# Patient Record
Sex: Male | Born: 1935 | Race: White | Hispanic: No | Marital: Married | Smoking: Never smoker
Health system: Southern US, Community
[De-identification: ages and names within clinical notes are randomized; demographics above are authoritative.]

## PROBLEM LIST (undated history)

## (undated) DIAGNOSIS — I10 Essential (primary) hypertension: Secondary | ICD-10-CM

## (undated) DIAGNOSIS — I82409 Acute embolism and thrombosis of unspecified deep veins of unspecified lower extremity: Secondary | ICD-10-CM

## (undated) DIAGNOSIS — K2960 Other gastritis without bleeding: Secondary | ICD-10-CM

## (undated) DIAGNOSIS — I714 Abdominal aortic aneurysm, without rupture, unspecified: Secondary | ICD-10-CM

## (undated) DIAGNOSIS — I493 Ventricular premature depolarization: Secondary | ICD-10-CM

## (undated) DIAGNOSIS — I5042 Chronic combined systolic (congestive) and diastolic (congestive) heart failure: Secondary | ICD-10-CM

## (undated) DIAGNOSIS — K449 Diaphragmatic hernia without obstruction or gangrene: Secondary | ICD-10-CM

## (undated) DIAGNOSIS — E785 Hyperlipidemia, unspecified: Secondary | ICD-10-CM

## (undated) DIAGNOSIS — I2699 Other pulmonary embolism without acute cor pulmonale: Secondary | ICD-10-CM

## (undated) DIAGNOSIS — R109 Unspecified abdominal pain: Secondary | ICD-10-CM

## (undated) DIAGNOSIS — N200 Calculus of kidney: Secondary | ICD-10-CM

## (undated) DIAGNOSIS — I2510 Atherosclerotic heart disease of native coronary artery without angina pectoris: Secondary | ICD-10-CM

## (undated) DIAGNOSIS — F419 Anxiety disorder, unspecified: Secondary | ICD-10-CM

## (undated) DIAGNOSIS — I491 Atrial premature depolarization: Secondary | ICD-10-CM

## (undated) DIAGNOSIS — K219 Gastro-esophageal reflux disease without esophagitis: Secondary | ICD-10-CM

## (undated) DIAGNOSIS — I255 Ischemic cardiomyopathy: Secondary | ICD-10-CM

## (undated) DIAGNOSIS — I639 Cerebral infarction, unspecified: Secondary | ICD-10-CM

## (undated) DIAGNOSIS — I219 Acute myocardial infarction, unspecified: Secondary | ICD-10-CM

## (undated) DIAGNOSIS — I340 Nonrheumatic mitral (valve) insufficiency: Secondary | ICD-10-CM

## (undated) DIAGNOSIS — M4800 Spinal stenosis, site unspecified: Secondary | ICD-10-CM

## (undated) HISTORY — DX: Gastro-esophageal reflux disease without esophagitis: K21.9

## (undated) HISTORY — DX: Other pulmonary embolism without acute cor pulmonale: I26.99

## (undated) HISTORY — DX: Ventricular premature depolarization: I49.3

## (undated) HISTORY — DX: Spinal stenosis, site unspecified: M48.00

## (undated) HISTORY — DX: Cerebral infarction, unspecified: I63.9

## (undated) HISTORY — PX: CORONARY ARTERY BYPASS GRAFT: SHX141

## (undated) HISTORY — DX: Nonrheumatic mitral (valve) insufficiency: I34.0

## (undated) HISTORY — DX: Abdominal aortic aneurysm, without rupture, unspecified: I71.40

## (undated) HISTORY — DX: Atrial premature depolarization: I49.1

## (undated) HISTORY — PX: SPINE SURGERY: SHX786

## (undated) HISTORY — PX: HIATAL HERNIA REPAIR: SHX195

## (undated) HISTORY — PX: ROTATOR CUFF REPAIR: SHX139

## (undated) HISTORY — PX: PTCA: SHX146

## (undated) HISTORY — DX: Other gastritis without bleeding: K29.60

## (undated) HISTORY — DX: Calculus of kidney: N20.0

## (undated) HISTORY — DX: Abdominal aortic aneurysm, without rupture: I71.4

## (undated) HISTORY — PX: TOTAL KNEE ARTHROPLASTY: SHX125

## (undated) HISTORY — DX: Acute myocardial infarction, unspecified: I21.9

## (undated) HISTORY — PX: ABDOMINAL AORTIC ANEURYSM REPAIR: SUR1152

## (undated) HISTORY — DX: Diaphragmatic hernia without obstruction or gangrene: K44.9

## (undated) HISTORY — DX: Chronic combined systolic (congestive) and diastolic (congestive) heart failure: I50.42

## (undated) HISTORY — DX: Unspecified abdominal pain: R10.9

## (undated) HISTORY — PX: BACK SURGERY: SHX140

## (undated) HISTORY — DX: Acute embolism and thrombosis of unspecified deep veins of unspecified lower extremity: I82.409

---

## 1998-04-03 ENCOUNTER — Encounter: Payer: Self-pay | Admitting: Neurosurgery

## 1998-04-03 ENCOUNTER — Ambulatory Visit (HOSPITAL_COMMUNITY): Admission: RE | Admit: 1998-04-03 | Discharge: 1998-04-03 | Payer: Self-pay | Admitting: Neurosurgery

## 1998-04-10 ENCOUNTER — Encounter: Payer: Self-pay | Admitting: Neurosurgery

## 1998-04-10 ENCOUNTER — Ambulatory Visit (HOSPITAL_COMMUNITY): Admission: RE | Admit: 1998-04-10 | Discharge: 1998-04-10 | Payer: Self-pay | Admitting: Neurosurgery

## 1998-04-26 ENCOUNTER — Encounter: Payer: Self-pay | Admitting: Neurosurgery

## 1998-04-26 ENCOUNTER — Ambulatory Visit (HOSPITAL_COMMUNITY): Admission: RE | Admit: 1998-04-26 | Discharge: 1998-04-26 | Payer: Self-pay | Admitting: Neurosurgery

## 1998-05-13 ENCOUNTER — Ambulatory Visit (HOSPITAL_COMMUNITY): Admission: RE | Admit: 1998-05-13 | Discharge: 1998-05-13 | Payer: Self-pay | Admitting: Neurosurgery

## 1998-05-13 ENCOUNTER — Encounter: Payer: Self-pay | Admitting: Neurosurgery

## 1999-11-01 ENCOUNTER — Inpatient Hospital Stay (HOSPITAL_COMMUNITY): Admission: EM | Admit: 1999-11-01 | Discharge: 1999-11-05 | Payer: Self-pay | Admitting: Cardiology

## 2000-07-23 ENCOUNTER — Encounter: Payer: Self-pay | Admitting: Cardiology

## 2000-07-24 ENCOUNTER — Inpatient Hospital Stay (HOSPITAL_COMMUNITY): Admission: RE | Admit: 2000-07-24 | Discharge: 2000-07-27 | Payer: Self-pay | Admitting: Cardiology

## 2000-11-10 ENCOUNTER — Inpatient Hospital Stay (HOSPITAL_COMMUNITY): Admission: RE | Admit: 2000-11-10 | Discharge: 2000-11-11 | Payer: Self-pay | Admitting: Cardiology

## 2000-12-24 ENCOUNTER — Encounter: Payer: Self-pay | Admitting: Urology

## 2000-12-24 ENCOUNTER — Encounter: Admission: RE | Admit: 2000-12-24 | Discharge: 2000-12-24 | Payer: Self-pay | Admitting: Urology

## 2001-05-19 ENCOUNTER — Inpatient Hospital Stay (HOSPITAL_COMMUNITY): Admission: AD | Admit: 2001-05-19 | Discharge: 2001-05-21 | Payer: Self-pay | Admitting: Cardiology

## 2002-12-13 ENCOUNTER — Encounter: Payer: Self-pay | Admitting: Orthopedic Surgery

## 2002-12-18 ENCOUNTER — Inpatient Hospital Stay (HOSPITAL_COMMUNITY): Admission: RE | Admit: 2002-12-18 | Discharge: 2002-12-22 | Payer: Self-pay | Admitting: Orthopedic Surgery

## 2002-12-18 ENCOUNTER — Encounter: Payer: Self-pay | Admitting: Orthopedic Surgery

## 2002-12-20 ENCOUNTER — Encounter: Payer: Self-pay | Admitting: Orthopedic Surgery

## 2003-05-19 HISTORY — PX: JOINT REPLACEMENT: SHX530

## 2003-06-27 ENCOUNTER — Ambulatory Visit (HOSPITAL_COMMUNITY): Admission: RE | Admit: 2003-06-27 | Discharge: 2003-06-28 | Payer: Self-pay | Admitting: Cardiology

## 2003-07-04 ENCOUNTER — Inpatient Hospital Stay (HOSPITAL_COMMUNITY): Admission: RE | Admit: 2003-07-04 | Discharge: 2003-07-09 | Payer: Self-pay | Admitting: Cardiothoracic Surgery

## 2003-09-14 ENCOUNTER — Encounter: Admission: RE | Admit: 2003-09-14 | Discharge: 2003-09-14 | Payer: Self-pay | Admitting: Cardiothoracic Surgery

## 2004-06-02 ENCOUNTER — Ambulatory Visit: Payer: Self-pay | Admitting: Cardiology

## 2004-06-16 ENCOUNTER — Ambulatory Visit: Payer: Self-pay | Admitting: Cardiology

## 2004-06-23 ENCOUNTER — Ambulatory Visit: Payer: Self-pay | Admitting: Cardiology

## 2004-07-25 ENCOUNTER — Ambulatory Visit: Payer: Self-pay | Admitting: Cardiology

## 2004-10-14 ENCOUNTER — Ambulatory Visit: Payer: Self-pay | Admitting: Cardiology

## 2004-11-07 ENCOUNTER — Ambulatory Visit: Payer: Self-pay | Admitting: Cardiology

## 2004-12-18 ENCOUNTER — Ambulatory Visit: Payer: Self-pay | Admitting: Cardiology

## 2005-05-28 ENCOUNTER — Ambulatory Visit: Payer: Self-pay | Admitting: Cardiology

## 2005-07-13 ENCOUNTER — Ambulatory Visit: Payer: Self-pay | Admitting: Cardiology

## 2005-10-09 ENCOUNTER — Ambulatory Visit: Payer: Self-pay | Admitting: Cardiology

## 2005-10-28 ENCOUNTER — Ambulatory Visit: Payer: Self-pay

## 2005-10-28 ENCOUNTER — Encounter: Payer: Self-pay | Admitting: Cardiology

## 2005-11-16 ENCOUNTER — Ambulatory Visit: Payer: Self-pay | Admitting: Cardiology

## 2006-05-20 ENCOUNTER — Ambulatory Visit: Payer: Self-pay | Admitting: Cardiology

## 2006-05-21 ENCOUNTER — Ambulatory Visit: Payer: Self-pay

## 2006-05-21 ENCOUNTER — Encounter: Payer: Self-pay | Admitting: Cardiology

## 2006-05-25 ENCOUNTER — Ambulatory Visit: Payer: Self-pay | Admitting: Cardiology

## 2006-07-02 ENCOUNTER — Encounter: Admission: RE | Admit: 2006-07-02 | Discharge: 2006-07-02 | Payer: Self-pay | Admitting: Cardiothoracic Surgery

## 2006-07-02 ENCOUNTER — Ambulatory Visit: Payer: Self-pay | Admitting: Cardiothoracic Surgery

## 2006-07-07 ENCOUNTER — Ambulatory Visit: Payer: Self-pay | Admitting: Cardiology

## 2006-08-05 ENCOUNTER — Ambulatory Visit: Payer: Self-pay | Admitting: Cardiology

## 2007-06-15 ENCOUNTER — Ambulatory Visit: Payer: Self-pay | Admitting: Cardiology

## 2007-07-06 ENCOUNTER — Ambulatory Visit: Payer: Self-pay

## 2007-07-06 ENCOUNTER — Ambulatory Visit: Payer: Self-pay | Admitting: Cardiology

## 2007-07-29 ENCOUNTER — Ambulatory Visit: Payer: Self-pay | Admitting: Cardiology

## 2007-08-01 ENCOUNTER — Ambulatory Visit: Payer: Self-pay

## 2007-08-07 ENCOUNTER — Encounter: Admission: RE | Admit: 2007-08-07 | Discharge: 2007-08-07 | Payer: Self-pay | Admitting: Neurology

## 2007-08-08 ENCOUNTER — Ambulatory Visit: Payer: Self-pay | Admitting: Cardiology

## 2007-08-21 ENCOUNTER — Encounter: Admission: RE | Admit: 2007-08-21 | Discharge: 2007-08-21 | Payer: Self-pay | Admitting: Neurology

## 2007-09-07 ENCOUNTER — Ambulatory Visit: Payer: Self-pay | Admitting: Cardiology

## 2007-09-27 ENCOUNTER — Encounter: Admission: RE | Admit: 2007-09-27 | Discharge: 2007-09-27 | Payer: Self-pay | Admitting: Orthopedic Surgery

## 2007-09-29 ENCOUNTER — Ambulatory Visit (HOSPITAL_BASED_OUTPATIENT_CLINIC_OR_DEPARTMENT_OTHER): Admission: RE | Admit: 2007-09-29 | Discharge: 2007-09-30 | Payer: Self-pay | Admitting: Orthopedic Surgery

## 2007-11-10 ENCOUNTER — Ambulatory Visit: Payer: Self-pay | Admitting: Cardiology

## 2008-02-15 ENCOUNTER — Ambulatory Visit: Payer: Self-pay | Admitting: Vascular Surgery

## 2008-03-22 ENCOUNTER — Ambulatory Visit: Payer: Self-pay | Admitting: Cardiology

## 2008-07-25 ENCOUNTER — Ambulatory Visit: Payer: Self-pay | Admitting: Vascular Surgery

## 2008-12-01 DIAGNOSIS — E785 Hyperlipidemia, unspecified: Secondary | ICD-10-CM | POA: Insufficient documentation

## 2008-12-01 DIAGNOSIS — I2510 Atherosclerotic heart disease of native coronary artery without angina pectoris: Secondary | ICD-10-CM | POA: Insufficient documentation

## 2008-12-01 DIAGNOSIS — Z96659 Presence of unspecified artificial knee joint: Secondary | ICD-10-CM

## 2008-12-01 DIAGNOSIS — I10 Essential (primary) hypertension: Secondary | ICD-10-CM

## 2008-12-01 DIAGNOSIS — I714 Abdominal aortic aneurysm, without rupture, unspecified: Secondary | ICD-10-CM | POA: Insufficient documentation

## 2008-12-01 DIAGNOSIS — Z8719 Personal history of other diseases of the digestive system: Secondary | ICD-10-CM

## 2008-12-01 DIAGNOSIS — K219 Gastro-esophageal reflux disease without esophagitis: Secondary | ICD-10-CM

## 2008-12-01 DIAGNOSIS — Z951 Presence of aortocoronary bypass graft: Secondary | ICD-10-CM | POA: Insufficient documentation

## 2008-12-01 DIAGNOSIS — Z9861 Coronary angioplasty status: Secondary | ICD-10-CM

## 2008-12-01 DIAGNOSIS — Z87442 Personal history of urinary calculi: Secondary | ICD-10-CM

## 2008-12-04 ENCOUNTER — Ambulatory Visit: Payer: Self-pay | Admitting: Cardiology

## 2008-12-20 ENCOUNTER — Emergency Department (HOSPITAL_COMMUNITY): Admission: EM | Admit: 2008-12-20 | Discharge: 2008-12-20 | Payer: Self-pay | Admitting: Emergency Medicine

## 2009-02-14 ENCOUNTER — Telehealth: Payer: Self-pay | Admitting: Cardiology

## 2009-05-02 ENCOUNTER — Encounter: Payer: Self-pay | Admitting: Cardiology

## 2009-06-14 ENCOUNTER — Ambulatory Visit: Payer: Self-pay | Admitting: Cardiology

## 2009-06-14 DIAGNOSIS — M629 Disorder of muscle, unspecified: Secondary | ICD-10-CM

## 2009-06-24 ENCOUNTER — Ambulatory Visit: Payer: Self-pay | Admitting: Vascular Surgery

## 2009-07-12 ENCOUNTER — Telehealth: Payer: Self-pay | Admitting: Cardiology

## 2009-07-17 ENCOUNTER — Encounter: Payer: Self-pay | Admitting: Cardiology

## 2009-07-17 ENCOUNTER — Ambulatory Visit: Payer: Self-pay

## 2009-07-17 ENCOUNTER — Ambulatory Visit: Payer: Self-pay | Admitting: Cardiology

## 2009-07-17 ENCOUNTER — Ambulatory Visit: Payer: Self-pay | Admitting: Cardiovascular Disease

## 2009-07-17 ENCOUNTER — Ambulatory Visit (HOSPITAL_COMMUNITY): Admission: RE | Admit: 2009-07-17 | Discharge: 2009-07-17 | Payer: Self-pay | Admitting: Cardiology

## 2009-11-04 ENCOUNTER — Encounter: Payer: Self-pay | Admitting: Cardiology

## 2009-11-05 ENCOUNTER — Ambulatory Visit: Payer: Self-pay | Admitting: Cardiology

## 2009-11-05 DIAGNOSIS — I25810 Atherosclerosis of coronary artery bypass graft(s) without angina pectoris: Secondary | ICD-10-CM | POA: Insufficient documentation

## 2010-02-13 ENCOUNTER — Encounter: Payer: Self-pay | Admitting: Cardiology

## 2010-02-13 ENCOUNTER — Ambulatory Visit: Payer: Self-pay | Admitting: Vascular Surgery

## 2010-03-03 ENCOUNTER — Encounter: Payer: Self-pay | Admitting: Cardiology

## 2010-03-03 ENCOUNTER — Ambulatory Visit: Payer: Self-pay | Admitting: Cardiology

## 2010-05-23 ENCOUNTER — Encounter: Payer: Self-pay | Admitting: Cardiology

## 2010-05-23 ENCOUNTER — Ambulatory Visit
Admission: RE | Admit: 2010-05-23 | Discharge: 2010-05-23 | Payer: Self-pay | Source: Home / Self Care | Attending: Cardiology | Admitting: Cardiology

## 2010-06-17 NOTE — Assessment & Plan Note (Signed)
Summary: f31m   Visit Type:  6 months follow up Primary Provider:  Shary Decamp  CC:  Chest pains and one episode of chest tightness 2 days ago.  History of Present Illness: Overall doing well.  Has changed his primary MD.  Is having some upper epigastric pain.  Hurts worse when he takes his pills.  Has not had recent evaluation.  He saw Dr. Chales Abrahams in past in [place].  Did have endoscopy.  Wednesday night it occured with one brief episode.  Also had some extra fluid and took an extra furosemide.    Current Medications (verified): 1)  Plavix 75 Mg Tabs (Clopidogrel Bisulfate) .... Take By Mouth Once Daily 2)  Aspirin 81 Mg Tabs (Aspirin) .... Take By Mouth Once Daily 3)  Simvastatin 40 Mg Tabs (Simvastatin) .... Take By Mouth At Bedtime 4)  Clonidine Hcl 0.1 Mg Tabs (Clonidine Hcl) .... Take By Mouth Two Times A Day 5)  Lisinopril 20 Mg Tabs (Lisinopril) .... Take 2 Tablets Daily 6)  Protonix 40 Mg Tbec (Pantoprazole Sodium) .... Take 1 Tablet By Mouth Two Times A Day 7)  Finasteride 5 Mg Tabs (Finasteride) .... Take By Mouth Once Daily 8)  Lasix 40 Mg Tabs (Furosemide) .... Take As Needed 9)  Hydrocodone-Acetaminophen 5-500 Mg Tabs (Hydrocodone-Acetaminophen) .... Take As Needed and As Directed. 10)  Nitroglycerin 0.4 Mg Subl (Nitroglycerin) .... Take As Needed For Chest Pain and As Directed. 11)  Ambien 10 Mg Tabs (Zolpidem Tartrate) .... As Needed 12)  Vitamin B 500mg  Tablet .... Take 1 Tablet By Mouth Two Times A Day  Allergies: 1)  ! Percocet 2)  ! * Mylanta  Vital Signs:  Patient profile:   77 year old male Height:      74 inches Weight:      189.25 pounds BMI:     24.39 Pulse rate:   64 / minute Pulse rhythm:   regular Resp:     18 per minute BP sitting:   128 / 80  (left arm) Cuff size:   large  Vitals Entered By: Vikki Ports (June 14, 2009 9:32 AM)  Physical Exam  General:  Well developed, well nourished, in no acute distress. Neck:  Neck supple, no JVD. No masses,  thyromegaly or abnormal cervical nodes. Lungs:  Clear bilaterally to auscultation and percussion. Heart:  Normal S1 and S2.  Soft apical murmur.   Abdomen:  Palpable aorta, minimal tenderness.  No hepatosplenomegaly.   Msk:  Back normal, normal gait. Muscle strength and tone normal. Extremities:  No clubbing or cyanosis.  no edema. Neurologic:  Alert and oriented x 3.   EKG  Procedure date:  06/14/2009  Findings:      NSR.  LAD.    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  Has some epigastric discomfort with slight tenderness.  Encouraged him to see Dr. Chales Abrahams in followup.  May need repeat endo and or CT.Marland Kitchen  No definite chest pain.  Orders: EKG w/ Interpretation (93000) Echocardiogram (Echo)  Problem # 2:  DYSLIPIDEMIA (ICD-272.4) Needs follow up labs. His updated medication list for this problem includes:    Simvastatin 40 Mg Tabs (Simvastatin) .Marland Kitchen... Take by mouth at bedtime  Problem # 3:  EROSIVE GASTRITIS, HX OF (ICD-535.40) Encouraged to hold ASA a few days.    Problem # 4:  ABDOMINAL AORTIC ANEURYSM (ICD-441.4) Scheduled to see [facility] in April.  Problem # 5:  HYPERTENSION (ICD-401.9)  Been pretty good recently. His updated medication list for this problem includes:    Aspirin 81 Mg Tabs (Aspirin) .Marland Kitchen... Take by mouth once daily    Clonidine Hcl 0.1 Mg Tabs (Clonidine hcl) .Marland Kitchen... Take by mouth two times a day    Lisinopril 20 Mg Tabs (Lisinopril) .Marland Kitchen... Take 2 tablets daily    Lasix 40 Mg Tabs (Furosemide) .Marland Kitchen... 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  Orders: Echocardiogram (Echo)  Patient Instructions: 1)  Your physician recommends that you schedule a follow-up appointment in: 6 WEEKS 2)  Your physician recommends that you continue on your current medications as  directed. Please refer to the Current Medication list given to you today. 3)  Your physician has requested that you have an echocardiogram in 6 WEEKS.  Echocardiography is a painless test that uses sound waves to create images of your heart. It provides your doctor with information about the size and shape of your heart and how well your heart's chambers and valves are working.  This procedure takes approximately one hour. There are no restrictions for this procedure.

## 2010-06-17 NOTE — Miscellaneous (Signed)
  Clinical Lists Changes  Observations: Added new observation of ECHOINTERP:  - Left ventricle: The cavity size was mildly dilated. Wall thickness       was normal. Systolic function was moderately reduced. The       estimated ejection fraction was in the range of 35% to 40%. There       is akinesis and scarring of the basal-mid inferolateral       myocardium. All other segments were hypokinetic. The study is not       technically sufficient to allow evaluation of LV diastolic       function.     - Aortic valve: Trivial regurgitation.     - Mitral valve: Mild to moderate regurgitation.     - Left atrium: The atrium was moderately dilated.     - Right atrium: The atrium was moderately dilated.     - Pulmonic valve: Moderate regurgitation.        (07/17/2009 14:02)      Echocardiogram  Procedure date:  07/17/2009  Findings:       - Left ventricle: The cavity size was mildly dilated. Wall thickness       was normal. Systolic function was moderately reduced. The       estimated ejection fraction was in the range of 35% to 40%. There       is akinesis and scarring of the basal-mid inferolateral       myocardium. All other segments were hypokinetic. The study is not       technically sufficient to allow evaluation of LV diastolic       function.     - Aortic valve: Trivial regurgitation.     - Mitral valve: Mild to moderate regurgitation.     - Left atrium: The atrium was moderately dilated.     - Right atrium: The atrium was moderately dilated.     - Pulmonic valve: Moderate regurgitation.

## 2010-06-17 NOTE — Progress Notes (Signed)
Summary: Plavix  Phone Note Call from Patient Call back at Home Phone [phone]   Caller: Patient Reason for Call: Talk to Nurse, Talk to Doctor Summary of Call: pt is on plavix and he is to have GI work done Monday and he has been off for a week because of surgery on his neck and he wants to know if it will be safe for him to continue being off the plavix. Patient needs a call today. Initial call taken by: Omer Jack,  July 12, 2009 9:00 AM  Follow-up for Phone Call        Spoke with patient. Pt. states was off Plavix first three days started tuesday 02/15 till the 17th  for skin cancer removal on neck. He took the medication the 18th and 19th. Then pt. stop taken Plavix again  2/23rd/20for a Colonoscopy on Monday 2/27th. Pt. needs to be off for 4 days after procedure. Pt. states this is the first time he  is calling the office because he is concern that he will be off Plavix for so long. Pt. states he can re-scheduled the Colonoscopy if he needs too. I let pt. know Dr. Riley Kill needs to recommend  if pt. can be off the  Plavix. It would be better for him to start taken  medicatin, and re-scheduled Colonoscopy when okay to do so.  Dr. Riley Kill and his nurse are not in the office today. I will send message to their desktop. Okay with pt.  Additional Follow-up for Phone Call Additional follow up Details #1::        This pt has a scheduled appt with Dr Riley Kill on 07/17/09.  We will discuss the plavix at his upcoming appt.  Additional Follow-up by: Julieta Gutting, RN, BSN,  July 16, 2009 11:17 AM    Additional Follow-up for Phone Call Additional follow up Details #2::    Reviewed with patient at follow up office visit. Follow-up by: Ronaldo Miyamoto, MD, [facility],  July 18, 2009 5:01 PM

## 2010-06-17 NOTE — Letter (Signed)
Summary: [facility] Visit   [facility] Visit   Imported By: Roderic Ovens 03/03/2010 17:21:27  _____________________________________________________________________  External Attachment:    Type:   Image     Comment:   External Document

## 2010-06-17 NOTE — Assessment & Plan Note (Signed)
Summary: f77m   Visit Type:  3 mo f/u Primary Provider:  Shary Decamp  CC:  edema/ankles at times...no other complaints today.  History of Present Illness: He thinks he is doing pretty well.  Everything overall doing well.  Has a little balance issue he is working on.  He is seeing Dr. Anne Hahn [facility], and he is seeing a neurologist in [place] as well.  He has been in rehab, and she told him to walk with a cane.  Has spinal stenosis, and a problem at L5.  Has nerve loss bilaterally.  No chest pain.  Had redo CABG 2005.  Has gained some weight since coming off the treadmill.  Current Medications (verified): 1)  Plavix 75 Mg Tabs (Clopidogrel Bisulfate) .... Take By Mouth Once Daily 2)  Simvastatin 80 Mg Tabs (Simvastatin) .... Take One Tablet By Mouth Daily At Bedtime 3)  Clonidine Hcl 0.1 Mg Tabs (Clonidine Hcl) .... Take By Mouth Two Times A Day 4)  Lisinopril 20 Mg Tabs (Lisinopril) .... Take 2 Tablets Daily 5)  Protonix 40 Mg Tbec (Pantoprazole Sodium) .... Take 1 Tablet By Mouth Two Times A Day 6)  Finasteride 5 Mg Tabs (Finasteride) .... Take By Mouth Once Daily 7)  Lasix 40 Mg Tabs (Furosemide) .... Take As Needed 8)  Hydrocodone-Acetaminophen 5-500 Mg Tabs (Hydrocodone-Acetaminophen) .... Take As Needed and As Directed. 9)  Nitroglycerin 0.4 Mg Subl (Nitroglycerin) .... Take As Needed For Chest Pain and As Directed. 10)  Ambien 10 Mg Tabs (Zolpidem Tartrate) .... As Needed 11)  Vitamin B 500mg  Tablet .... Take 1 Tablet By Mouth Two Times A Day  Allergies: 1)  ! Percocet 2)  ! * Mylanta  Vital Signs:  Patient profile:   77 year old male Height:      74 inches Weight:      190 pounds BMI:     24.48 Pulse rate:   59 / minute Pulse rhythm:   irregular BP sitting:   132 / 62  (left arm) Cuff size:   large  Vitals Entered By: Danielle Rankin, CMA (November 05, 2009 10:14 AM)  Physical Exam  General:  Well developed, well nourished, in no acute distress. Head:  normocephalic and  atraumatic Eyes:  PERRLA/EOM intact; conjunctiva and lids normal. Lungs:  Clear bilaterally to auscultation and percussion. Heart:  No rub, or murmur.   Abdomen:  Bowel sounds positive; abdomen soft and non-tender without masses, organomegaly, or hernias noted. No hepatosplenomegaly. Extremities:  No clubbing or cyanosis. Neurologic:  Alert and oriented x 3.   EKG  Procedure date:  11/05/2009  Findings:      SB.  LAD. Old inferior MI..  Occasional PVC.   Impression & Recommendations:  Problem # 1:  CAD, ARTERY BYPASS GRAFT (ICD-414.04)  His updated medication list for this problem includes:    Plavix 75 Mg Tabs (Clopidogrel bisulfate) .Marland Kitchen... Take by mouth once daily    Lisinopril 20 Mg Tabs (Lisinopril) .Marland Kitchen... Take 2 tablets daily    Nitroglycerin 0.4 Mg Subl (Nitroglycerin) .Marland Kitchen... Take as needed for chest pain and as directed.  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  Also has PI.    Problem # 3:  DYSLIPIDEMIA (ICD-272.4) would suggest reducing dose to 40mg  based on FDA warning.  His updated medication list for this problem includes:    Simvastatin 40 Mg Tabs (Simvastatin) .Marland Kitchen... Take one tablet by mouth daily at bedtime  Problem # 4:  ABDOMINAL AORTIC ANEURYSM (ICD-441.4) Being followed by [facility].  Seen regularly  Patient Instructions: 1)  Your physician has recommended you make the following change in your medication: DECREASE Simvastatin to 40mg  once a day 2)  Your physician wants you to follow-up in: 4 MONTHS.  You will receive a reminder letter in the mail two months in advance. If you don't receive a letter, please call our office to schedule the follow-up appointment. Prescriptions: SIMVASTATIN 40 MG TABS (SIMVASTATIN) Take one tablet by mouth daily at bedtime  #90 x 1   Entered by:   Julieta Gutting, RN, BSN   Authorized by:   Ronaldo Miyamoto, MD, [facility]   Signed by:   Julieta Gutting, RN, BSN on 11/05/2009   Method used:   Print then Give to Patient   RxID:   [id number]

## 2010-06-17 NOTE — Assessment & Plan Note (Signed)
Summary: f14m   Visit Type:  Follow-up Primary Provider:  Shary Decamp  CC:  SOB recently.  History of Present Illness: Has seen Dr. Darrick Penna recently, and he asked Mr. Gabbard to see more quickly.  Since last seen, has had some breathing issues.  He feels it in his epigastrium when he takes a breath.    Overall he notices most when he is sitting.    Has no tightness at all when he walks, and stays on a recumbent exercise machine for thirty minutes, and no symptoms there either.    Current Medications (verified): 1)  Plavix 75 Mg Tabs (Clopidogrel Bisulfate) .... Take By Mouth Once Daily 2)  Simvastatin 40 Mg Tabs (Simvastatin) .... Take One Tablet By Mouth Daily At Bedtime 3)  Clonidine Hcl 0.1 Mg Tabs (Clonidine Hcl) .... Take By Mouth Two Times A Day 4)  Lisinopril 20 Mg Tabs (Lisinopril) .... Take 2 Tablets Daily 5)  Protonix 40 Mg Tbec (Pantoprazole Sodium) .... Take 1 Tablet By Mouth Two Times A Day 6)  Finasteride 5 Mg Tabs (Finasteride) .... Take By Mouth Once Daily 7)  Lasix 40 Mg Tabs (Furosemide) .... Take As Needed 8)  Hydrocodone-Acetaminophen 5-500 Mg Tabs (Hydrocodone-Acetaminophen) .... Take As Needed and As Directed. 9)  Nitroglycerin 0.4 Mg Subl (Nitroglycerin) .... Take As Needed For Chest Pain and As Directed. 10)  Ambien 10 Mg Tabs (Zolpidem Tartrate) .... As Needed 11)  Vitamin B 500mg  Tablet .... Take 1 Tablet By Mouth Two Times A Day 12)  Polyethylene Glycol 8000  Powd (Polyethylene Glycol 8000) .... About 4 Times A Week  Allergies (verified): 1)  ! Percocet 2)  ! * Mylanta  Vital Signs:  Patient profile:   77 year old male Height:      74 inches Weight:      190 pounds BMI:     24.48 Pulse rate:   66 / minute BP sitting:   142 / 80  (left arm) Cuff size:   regular  Vitals Entered By: Hardin Negus, RMA (March 03, 2010 11:14 AM)  Physical Exam  General:  Well developed, well nourished, in no acute distress. Head:  normocephalic and atraumatic Eyes:   PERRLA/EOM intact; conjunctiva and lids normal. Lungs:  Clear bilaterally to auscultation and percussion. Heart:  PMI non displaced.  NOrmal S1 and S2.  No sig murmur. Abdomen:  Palpable anuerysm.  No def tenderness. Msk:  Back normal, normal gait. Muscle strength and tone normal. Pulses:  pulses normal in all 4 extremities Extremities:  No clubbing or cyanosis. Neurologic:  Alert and oriented x 3.   [place] of [place]domen  Procedure date:  02/13/2010  Findings:       DUPLEX EXAM:         AP (cm)                   TRANSVERSE (cm)   Proximal             1.84 cm                   1.79 cm   Mid                  4.29 cm                   4.27 cm   Distal               2.28 cm  2.28 cm   Right Iliac          0.98 cm                   Cm   Left Iliac           1.01 cm                   cm      PREVIOUS:  Date:  06/24/2009  AP:  4.22  TRANSVERSE:  4.35      IMPRESSION:  Abdominal aortic aneurysm noted with largest measurement of   4.29 x 4.27 cm.      ___________________________________________   [name], MD      [place]  Procedure date:  03/03/2010  Findings:      NSr.  PAC.  Probable old IMI.  No acute changes.  EKG  Procedure date:  03/03/2010  Findings:      NSR.  Poss old IMI.  PACs.   Impression & Recommendations:  Problem # 1:  CAD, ARTERY BYPASS GRAFT (ICD-414.04) Remains stable.  No chest pain of significance. Could have minor periodontal surgery.  Could stop Plavix four to five days before the procedure without consequence.  He is not on warfarin, so he has not had an INR.   His updated medication list for this problem includes:    Plavix 75 Mg Tabs (Clopidogrel bisulfate) .Marland Kitchen... Take by mouth once daily    Lisinopril 20 Mg Tabs (Lisinopril) .Marland Kitchen... Take 2 tablets daily    Nitroglycerin 0.4 Mg Subl (Nitroglycerin) .Marland Kitchen... Take as needed for chest pain and as directed.  Problem # 2:  ABDOMINAL AORTIC ANEURYSM (ICD-441.4) Continues to monitor  anuerysm.  Will see him back in a year.   Problem # 3:  HYPERTENSION (ICD-401.9) boerderline control. His updated medication list for this problem includes:    Clonidine Hcl 0.1 Mg Tabs (Clonidine hcl) .Marland Kitchen... Take by mouth two times a day    Lisinopril 20 Mg Tabs (Lisinopril) .Marland Kitchen... Take 2 tablets daily    Lasix 40 Mg Tabs (Furosemide) .Marland Kitchen... Take as needed  Other Orders: EKG w/ Interpretation (93000)  Patient Instructions: 1)  Your physician recommends that you schedule a follow-up appointment in: 3 MONTHS 2)  Your physician recommends that you continue on your current medications as directed. Please refer to the Current Medication list given to you today. 3)  Per Dr Riley Kill the patient can proceed with dental procedure.

## 2010-06-17 NOTE — Assessment & Plan Note (Signed)
Summary: 6wk f/u   Visit Type:  6 wk f/u Primary Provider:  Shary Decamp  CC:  sob ..denies any cp or edema.  History of Present Illness: Patient is actually better since ASA has been stopped.  Has been off Plavix because of neck surgery.  Patient has not had DES.  No problems off of drug.  Supposed to have endo study.   Long discussion about medications.  Current Medications (verified): 1)  Plavix 75 Mg Tabs (Clopidogrel Bisulfate) .... Take By Mouth Once Daily 2)  Aspirin 81 Mg Tabs (Aspirin) .... Take By Mouth Once Daily 3)  Simvastatin 40 Mg Tabs (Simvastatin) .... Take By Mouth At Bedtime 4)  Clonidine Hcl 0.1 Mg Tabs (Clonidine Hcl) .... Take By Mouth Two Times A Day 5)  Lisinopril 20 Mg Tabs (Lisinopril) .... Take 2 Tablets Daily 6)  Protonix 40 Mg Tbec (Pantoprazole Sodium) .... Take 1 Tablet By Mouth Two Times A Day 7)  Finasteride 5 Mg Tabs (Finasteride) .... Take By Mouth Once Daily 8)  Lasix 40 Mg Tabs (Furosemide) .... Take As Needed 9)  Hydrocodone-Acetaminophen 5-500 Mg Tabs (Hydrocodone-Acetaminophen) .... Take As Needed and As Directed. 10)  Nitroglycerin 0.4 Mg Subl (Nitroglycerin) .... Take As Needed For Chest Pain and As Directed. 11)  Ambien 10 Mg Tabs (Zolpidem Tartrate) .... As Needed 12)  Vitamin B 500mg  Tablet .... Take 1 Tablet By Mouth Two Times A Day  Allergies: 1)  ! Percocet 2)  ! * Mylanta  Vital Signs:  Patient profile:   77 year old male Height:      74 inches Weight:      189 pounds BMI:     24.35 Pulse rate:   60 / minute Pulse rhythm:   regular BP sitting:   140 / 80  (left arm) Cuff size:   large  Vitals Entered By: Danielle Rankin, CMA (July 17, 2009 12:07 PM)  Physical Exam  General:  Well developed, well nourished, in no acute distress. Lungs:  Clear bilaterally to auscultation and percussion. Heart:  NOrmal S1 and S2.  No def murmur.   Abdomen:  Bowel sounds positive; abdomen soft and non-tender without masses, organomegaly, or hernias  noted. No hepatosplenomegaly. Extremities:  No clubbing or cyanosis. Neurologic:  Alert and oriented x 3.   Impression & Recommendations:  Problem # 1:  CORONARY ARTERY DISEASE (ICD-414.00) stable..  Tolerating well.  The following medications were removed from the medication list:    Aspirin 81 Mg Tabs (Aspirin) .Marland Kitchen... Take by mouth once daily His updated medication list for this problem includes:    Plavix 75 Mg Tabs (Clopidogrel bisulfate) .Marland Kitchen... Take by mouth once daily    Lisinopril 20 Mg Tabs (Lisinopril) .Marland Kitchen... Take 2 tablets daily    Nitroglycerin 0.4 Mg Subl (Nitroglycerin) .Marland Kitchen... Take as needed for chest pain and as directed.  Problem # 2:  EROSIVE GASTRITIS, HX OF (ICD-535.40) Now off ASA.  Will remain off of this.  Problem # 3:  DYSLIPIDEMIA (ICD-272.4) Gets labs with Dr. Shary Decamp. His updated medication list for this problem includes:    Simvastatin 40 Mg Tabs (Simvastatin) .Marland Kitchen... Take by mouth at bedtime  Problem # 4:  ABDOMINAL AORTIC ANEURYSM (ICD-441.4) Saw Dr. Darrick Penna in February.  Patient Instructions: 1)  Your physician recommends that you schedule a follow-up appointment in: 3 MONTHS 2)  Your physician has recommended you make the following change in your medication: STOP Aspirin

## 2010-06-19 NOTE — Assessment & Plan Note (Signed)
Summary: Jonathan Arroyo   Visit Type:  Follow-up Primary Provider:  Shary Decamp  CC:  no complaints.  History of Present Illness: stable and doing great at present.  Does not have any angina per se.  Feels good.  Remains on plavix and we discussed in detail  No current dizziness.   Current Medications (verified): 1)  Plavix 75 Mg Tabs (Clopidogrel Bisulfate) .... Take By Mouth Once Daily 2)  Simvastatin 40 Mg Tabs (Simvastatin) .... Take One Tablet By Mouth Daily At Bedtime 3)  Clonidine Hcl 0.1 Mg Tabs (Clonidine Hcl) .... Take By Mouth Two Times A Day 4)  Lisinopril 20 Mg Tabs (Lisinopril) .... Take 2 Tablets Daily 5)  Protonix 40 Mg Tbec (Pantoprazole Sodium) .... Take 1 Tablet By Mouth Two Times A Day 6)  Finasteride 5 Mg Tabs (Finasteride) .... Take By Mouth Once Daily 7)  Lasix 40 Mg Tabs (Furosemide) .... Take As Needed 8)  Hydrocodone-Acetaminophen 5-500 Mg Tabs (Hydrocodone-Acetaminophen) .... Take As Needed and As Directed. 9)  Nitroglycerin 0.4 Mg Subl (Nitroglycerin) .... Take As Needed For Chest Pain and As Directed. 10)  Ambien 10 Mg Tabs (Zolpidem Tartrate) .... As Needed 11)  Polyethylene Glycol 8000  Powd (Polyethylene Glycol 8000) .... About 4 Times A Week  Allergies (verified): 1)  ! Percocet 2)  ! * Mylanta  Past History:  Past Medical History: Last updated: 12/01/2008 DYSLIPIDEMIA (ICD-272.4) RENAL CALCULUS, HX OF (ICD-V13.01) EROSIVE GASTRITIS, HX OF (ICD-535.40) SPINAL STENOSIS, HX OF (ICD-724.00) HIATAL HERNIA, HX OF (ICD-V12.79) DVT, HX OF (ICD-453.40) ABDOMINAL AORTIC ANEURYSM (ICD-441.4) GASTROESOPHAGEAL REFLUX DISEASE (ICD-530.81) HYPERTENSION (ICD-401.9) CORONARY ARTERY DISEASE (ICD-414.00)    Vital Signs:  Patient profile:   77 year old male Height:      74 inches Weight:      193 pounds Pulse rate:   56 / minute BP sitting:   138 / 82  (left arm)  Vitals Entered By: Jacquelin Hawking, CMA (May 23, 2010 11:17 AM)  Physical Exam  General:  Well  developed, well nourished, in no acute distress. Head:  normocephalic and atraumatic Eyes:  PERRLA/EOM intact; conjunctiva and lids normal. Lungs:  Clear bilaterally to auscultation and percussion. Heart:  PMI non displaced. Normal S1 and S2.  No murmur. Abdomen:  Bowel sounds positive; abdomen soft and non-tender without masses, organomegaly, or hernias noted. No hepatosplenomegaly. Extremities:  No clubbing or cyanosis. Neurologic:  Alert and oriented x 3.   EKG  Procedure date:  05/23/2010  Findings:      NSR.  Occasional PVCs.  R wave progression likely secondary to lead position.   Impression & Recommendations:  Problem # 1:  CAD, ARTERY BYPASS GRAFT (ICD-414.04) stablel at present.  No angina.  I favor long term thienopyridine treatment.  His updated medication list for this problem includes:    Plavix 75 Mg Tabs (Clopidogrel bisulfate) .Marland Kitchen... Take by mouth once daily    Lisinopril 20 Mg Tabs (Lisinopril) .Marland Kitchen... Take 2 tablets daily    Nitroglycerin 0.4 Mg Subl (Nitroglycerin) .Marland Kitchen... Take as needed for chest pain and as directed.  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   Problem # 3:  DYSLIPIDEMIA (ICD-272.4) followed by Dr. Shary Decamp His updated medication list for this problem includes:    Simvastatin 40 Mg Tabs (Simvastatin) .Marland Kitchen... Take one tablet by mouth daily at bedtime  Problem # 4:  ABDOMINAL AORTIC ANEURYSM (ICD-441.4) followed by [facility] at present.  Slight  growth.  Problem # 5:  EROSIVE GASTRITIS, HX OF (ICD-535.40) not on ASA, remains on plavix.   Patient Instructions: 1)  Your physician recommends that you continue on your current medications as directed. Please refer to the Current Medication list given to you today. 2)  Your physician wants you to follow-up in:  6 MONTHS.  You will receive a reminder letter in the mail two months in advance. If you don't receive a  letter, please call our office to schedule the follow-up appointment.

## 2010-08-23 LAB — DIFFERENTIAL
Basophils Relative: 1 % (ref 0–1)
Eosinophils Absolute: 0.1 10*3/uL (ref 0.0–0.7)
Eosinophils Relative: 1 % (ref 0–5)
Lymphs Abs: 1.3 10*3/uL (ref 0.7–4.0)
Monocytes Absolute: 0.5 10*3/uL (ref 0.1–1.0)
Monocytes Relative: 7 % (ref 3–12)
Neutrophils Relative %: 77 % (ref 43–77)

## 2010-08-23 LAB — URINALYSIS, ROUTINE W REFLEX MICROSCOPIC
Ketones, ur: NEGATIVE mg/dL
Leukocytes, UA: NEGATIVE
Nitrite: NEGATIVE
Urobilinogen, UA: 1 mg/dL (ref 0.0–1.0)
pH: 5.5 (ref 5.0–8.0)

## 2010-08-23 LAB — POCT I-STAT, CHEM 8
Calcium, Ion: 1.19 mmol/L (ref 1.12–1.32)
Creatinine, Ser: 1.1 mg/dL (ref 0.4–1.5)
Glucose, Bld: 102 mg/dL — ABNORMAL HIGH (ref 70–99)
HCT: 45 % (ref 39.0–52.0)
Hemoglobin: 15.3 g/dL (ref 13.0–17.0)
TCO2: 25 mmol/L (ref 0–100)

## 2010-08-23 LAB — CBC
HCT: 44.3 % (ref 39.0–52.0)
Hemoglobin: 15.1 g/dL (ref 13.0–17.0)
MCHC: 34.1 g/dL (ref 30.0–36.0)
MCV: 95.7 fL (ref 78.0–100.0)
RBC: 4.64 MIL/uL (ref 4.22–5.81)
WBC: 8.2 10*3/uL (ref 4.0–10.5)

## 2010-08-23 LAB — URINE MICROSCOPIC-ADD ON

## 2010-09-30 NOTE — Assessment & Plan Note (Signed)
[facility]                            CARDIOLOGY OFFICE NOTE   NAME:Jonathan Arroyo, Jonathan Arroyo                     MRN:          [id number]  DATE:09/07/2007                            DOB:          02-08-1930    Jonathan Arroyo is seen in a follow-up evaluation.  In general, he has been  stable.  He has seen Dr. Richardson Landry, and needs rotator cuff surgery.  Dr. Eulah Pont would like him off of Plavix for at least 7 days in a  preoperative fashion.  Jonathan Arroyo has not been having any ongoing  chest pain.  Today, he and I had a very lengthy discussion about his  current situation and also the risks associated with surgery.  Fortunately, he has not been having any ongoing active chest pain.  The  patient has been stable since undergoing re-do revascularization by Dr.  Donata Clay.  His surgery was in February 2005.  At that time, the patient  underwent re-do revascularization surgery July 04, 2003, was taken  to the operating room where he had bypass grafting x2.  A right radial  artery was placed in the posterior descending, and the saphenous vein  graft was placed to the circumflex marginal.  He has done relatively  well since that time.  In February of this year, the patient underwent  an adenosine study.  This really revealed an ejection fraction of 41%  with akinesis of the inferior wall.  There was a prior inferior infarct  with very mild peri-infarct ischemia which thought to be a low-risk  study in general.   MEDICATIONS:  To his medications include:  1. Plavix 75 mg daily.  2. Aspirin 81 mg daily.  3. Simvastatin 40 mg q.h.s.  4. Protonix 40 mg b.i.d.  5. Metoclopramide 10 mg b.i.d.  6. Clonidine 0.1 mg p.o. b.i.d.  7. Norvasc 7.5 mg daily and lisinopril 40 mg daily.   PHYSICAL EXAMINATION:  GENERAL:  On physical he is alert and oriented.  He is no distress a the present time.  VITAL SIGNS:  His examination reveals a weight of 196, blood pressure  141/72, and the pulse is 52.  The lung fields are clear, and the cardiac  rhythm is irregular with an S4 gallop.   The patient's resting tracing reveals sinus bradycardia with inferior  posterior infarct of indeterminate age.   IMPRESSION:  1. Extensive coronary artery disease, status post re-do      revascularization surgery in February 2005.  2. Hypertension with moderate control.  3. Reduced overall left ventricular function   Overall, Jonathan Arroyo is not a high-risk patient for surgery.  Likewise,  he is not extremely low risk either, given his underlying extensive  vascular disease, and advanced age.  At given, the patient is in severe  pain, with ongoing current symptoms.  He said that it would be difficult  to  continue on as he is doing.  I have given him permission to go off  Plavix, as recommended by Dr. Eulah Pont.  We will be happy to follow him  during his hospitalization.  Arturo Morton. Riley Kill, MD, [facility]  Electronically Signed    TDS/MedQ  DD: 09/10/2007  DT: 09/10/2007  Job #: [id number]   cc:   Loreta Ave, M.D.

## 2010-09-30 NOTE — Procedures (Signed)
VASCULAR LAB EXAM   INDICATION:  Abdominal aortic aneurysm with full popliteal pulses.   HISTORY:  Diabetes:  No.  Cardiac:  Yes.  Hypertension:  Yes.   EXAM:  Evaluate bilateral popliteal arteries for aneurysms.   IMPRESSION:  1. Bilateral popliteal arteries were imaged, Dopplered and show no      evidence of aneurysm.  2. Evidence of minimal plaque noted bilaterally.  3. Right popliteal artery largest measurement = 0.89 cm x 0.89 cm.  4. Left popliteal artery largest measurement = 0.93 cm x 0.99 cm.   ___________________________________________  [name], MD   AS/MEDQ  D:  02/15/2008  T:  02/15/2008  Job:  [id number]

## 2010-09-30 NOTE — Op Note (Signed)
NAMEWILKIN, LIPPY              ACCOUNT NO.:  [id number]   MEDICAL RECORD NO.:  [id number]          PATIENT TYPE:  AMB   LOCATION:  DSC                          FACILITY:  [facility]   PHYSICIAN:  Loreta Ave, M.D. DATE OF BIRTH:  26-Jun-1929   DATE OF PROCEDURE:  09/29/2007  DATE OF DISCHARGE:  09/30/2007                               OPERATIVE REPORT   PREOPERATIVE DIAGNOSES:  Left shoulder massive retracted rotator cuff  tear with impingement and distal clavicle osteolysis.   POSTOPERATIVE DIAGNOSES:  Left shoulder massive retracted rotator cuff  tear with impingement and distal clavicle osteolysis with tearing of the  entire supra and infraspinatus.  Retracted but still mobile.  Partial  tearing of long head biceps tendon.  Extensive degenerative labral  tears.   PROCEDURE:  Left shoulder exam under anesthesia, arthroscopy,  debridement of labrum and biceps tendon, and retaining long head intact.  Acromioplasty, coracoacromial ligament release, and distal clavicle  excision.  Arthroscopic-assisted rotator cuff repair, fiber weave suture  x3, swivel lock anchors x3.   SURGEON:  Loreta Ave, MD   ASSISTANT:  Genene Churn. Barry Dienes, [place]   ANESTHESIA:  General.   BLOOD LOSS:  Minimal.   SPECIMENS:  None.   CULTURES:  None.   COMPLICATIONS:  None.   DRESSING:  Soft compression.   PROCEDURE:  The patient was brought to the operating room and placed on  the operating table in supine position.  After adequate anesthesia had  been obtained, shoulder was examined.  Full passive motion, good  stability.  Placed in a beach-chair position on the shoulder positioner  and prepped and draped in usual sterile fashion.  Three portals;  anterior, posterior, and lateral.  Shoulder entered with blunt  obturator, arthroscope introduced, shoulder distended and inspected.  Circumferential degenerative tearing labrum debrided.  Glenohumeral  joint grade 2 changes.  Tearing of about a third  to half of the  integrity long head biceps debrided but still reasonable anchor and  reasonable remaining tissue.  Entire supra and infraspinatus torn off.  Debrided back to healthy tissue.  A lot of intratendinous tearing, but  this was still very mobile and could be brought over laterally fairly  easily, suggesting more acute nature of this tear with underlying  tendinopathy.  From above, type 2 and 3 acromion.  Acromioplasty to a  type 1 acromion with shaver and high-speed burr releasing CA ligament  with cautery.  Distal clavicle, grade 4 changes with spurs.  Periarticular spurs lateral centimeter of clavicle resected.  Utilizing  all three portals and opening up the lateral portal inserting a cannula,  the cuff was mobilized.  It was captured with horizontal mattress suture  in the infraspinatus and then 2 in the supraspinatus.  These were  brought out laterally.  Anchored with swivel lock stand in the humerus.  I got the infraspinatus anchored fairly well at its normal attachment  site, but the bone quality at the supraspinatus attachment was so poor,  I could not get any anchors there.  These were brought over top around  the corner of the  tuberosity and then anchored laterally into the side  of the humerus in order to create a large footprint and anchor the  swivel locks and sutured down to reasonable position.  This overlapped  the infraspinatus repair.  At completion, I had a nice firm repair, and  nice firm watertight closure throughout, and excellent decompression,  confirmed viewing from all portals.  Instruments and fluid removed.  Portals were all closed with nylon.  Sterile compressive dressing was  applied.  Anesthesia was reversed.  Brought to the recovery room.  Tolerated the surgery well.  No complications.      Loreta Ave, M.D.  Electronically Signed     DFM/MEDQ  D:  09/30/2007  T:  10/01/2007  Job:  [id number]

## 2010-09-30 NOTE — Assessment & Plan Note (Signed)
[facility]                            CARDIOLOGY OFFICE NOTE   NAME:Jonathan Arroyo, Jonathan Arroyo                     MRN:          [id number]  DATE:11/10/2007                            DOB:          January 29, 1930    Jonathan Arroyo is in for followup.  In general, he is stable.  He has  successfully navigated his shoulder surgery without too much difficulty.  According to his wife, he is very specific about what he eats, and she  has had some concern about this.  He generally just does not feel very  well, and he does not know anything specific that would produce this.  He does admit to, perhaps some depression at times.   MEDICATIONS:  1. Plavix 75 mg daily.  2. Aspirin 81 mg daily.  3. Simvastatin 40 mg nightly.  4. Clonidine 0.1 mg b.i.d.  5. Norvasc 7.5 mg daily.  6. Lisinopril 20 mg 2 tablets daily.  7. Protonix 40 mg daily.   PHYSICAL EXAMINATION:  GENERAL:  He is alert and oriented in no  distress.  VITAL SIGNS:  Blood pressure is 126/90 and the pulse is 60.  LUNGS:  Lung fields are clear.  CARDIAC:  Rhythm is regular.  There is an S4 gallop.   IMPRESSION:  1. Coronary artery disease, status post redo revascularization surgery      in February 2005.  2. Hypertension with moderate control.  3. Reduced overall left ventricular function.  4. Hypercholesterolemia on lipid-lowering therapy.   PLAN:  1. Return to clinic in 3-4 months.  2. No changes in current medical regimen.  3. I have encouraged him to liberalize his diet to some extent given      his wife's concerns.     Arturo Morton. Riley Kill, MD, [facility]  Electronically Signed    TDS/MedQ  DD: 11/10/2007  DT: 11/11/2007  Job #: [id number]

## 2010-09-30 NOTE — Procedures (Signed)
DUPLEX ULTRASOUND OF ABDOMINAL AORTA   INDICATION:  Follow up of abdominal aortic aneurysm.   HISTORY:  Diabetes:  No  Cardiac:  Yes  Hypertension:  Yes  Smoking:  No  Connective Tissue Disorder:  Family History:  Previous Surgery:   DUPLEX EXAM:         AP (cm)                   TRANSVERSE (cm)  Proximal             2.0 cm                    1.84 cm  Mid                  4.22 cm                   4.35 cm  Distal               2.24 cm                   2.12 cm  Right Iliac          1.23 cm                   1.24 cm  Left Iliac           1.12 cm                   1.10 cm   PREVIOUS:  Date:  AP:  3.6  TRANSVERSE:  4.2   IMPRESSION:  Abdominal aortic aneurysm noted with the largest  measurement of (4.22 cm x 4.3 cm).   ___________________________________________  Janetta Hora Fields, MD   MG/MEDQ  D:  06/24/2009  T:  06/25/2009  Job:  [id number]

## 2010-09-30 NOTE — Assessment & Plan Note (Signed)
OFFICE VISIT   Jonathan, Arroyo  DOB:  Apr 07, 1930                                       02/13/2010  WUJWJ#:[id number]   The patient is a 77 year old male who returns for followup for a small  abdominal aortic aneurysm today.  He was last seen in September of 2009.  The aneurysm at that time was 4.1 cm in diameter.  He had an abdominal  aortic ultrasound today which shows the aneurysm is now 4.3 cm in  diameter.  He denies any abdominal or back pain.   Chronic medical problems remain coronary artery disease, elevated  cholesterol, hypertension and congestive failure.  These are all  currently stable and followed by Dr. Shary Arroyo, Dr. Harrison Arroyo and Dr. Riley Arroyo.   PAST SURGICAL HISTORY:  Coronary artery bypass grafting, multiple back  operations, knee replacement and shoulder surgery.   SOCIAL HISTORY:  He is married.  He is retired.  Nonsmoker, nonconsumer  of alcohol.   FAMILY HISTORY:  Remarkable for his mother having vascular disease at a  young age.   REVIEW OF SYSTEMS:  Full 12 point review of systems was performed with  the patient today.  Please see intake referral form for details  regarding this.   PHYSICAL EXAM:  Vital signs:  Blood pressure is 128/85 in the left arm,  heart rate is 76 and regular, oxygen saturation 99%.  HEENT:  Unremarkable.  Neck:  Has 2+ carotid pulses without bruit.  Chest:  Clear to auscultation.  Cardiac exam is regular rate and rhythm without  murmur.  Abdomen:  Soft, nontender, nondistended.  He does have a  pulsatile mass palpable in the left epigastrium region.  Musculoskeletal:  Exam shows no major joint deformities.  Neurologic:  He has symmetric upper extremity and lower extremity motor strength  which is 5/5.  He does have decreased sensation in both feet.  Peripheral pulse exam:  He has 2+ radial, femoral and dorsalis pedis  pulses bilaterally.  Skin:  Has no open ulcers or rashes.   Duplex ultrasound of the  abdominal aorta was reviewed today which shows  the aneurysm is currently 4.3 cm in diameter which is essentially  unchanged from his last ultrasound in our office in February of 2011 but  does represent a growth of 2 mm since 2009.   In summary, the patient still has a small abdominal aortic aneurysm that  is asymptomatic and currently does not require repair since it is much  less than the threshold of 5.5 cm in diameter.  He will follow up with a  repeat abdominal aortic ultrasound in 6 months' time.     Jonathan Hora. Fields, MD  Electronically Signed   CEF/MEDQ  D:  02/13/2010  T:  02/14/2010  Job:  [id number]   cc:   Jonathan Clock. Jonathan Kill, MD, [facility]

## 2010-09-30 NOTE — Procedures (Signed)
DUPLEX ULTRASOUND OF ABDOMINAL AORTA   INDICATION:  Follow-up evaluation of known abdominal aortic aneurysm.   HISTORY:  Diabetes:  No.  Cardiac:  Yes.  Hypertension:  Yes.  Smoking:  No.  Connective Tissue Disorder:  Family History:  No.  Previous Surgery:  No.   DUPLEX EXAM:         AP (cm)                   TRANSVERSE (cm)  Proximal             1.2 cm                    1.6 cm  Mid                  3.6 cm                    4.2 cm  Distal               1.5 cm                    1.6 cm  Right Iliac          0.97 cm                   0.75 cm  Left Iliac           0.88 cm                   0.82 cm   PREVIOUS:  Date:  By CT  AP:  4.1  TRANSVERSE:   IMPRESSION:  No significant change compared to previous study.   ___________________________________________  Janetta Hora Fields, MD   MC/MEDQ  D:  07/25/2008  T:  07/25/2008  Job:  [id number]

## 2010-09-30 NOTE — Assessment & Plan Note (Signed)
[facility]                            CARDIOLOGY OFFICE NOTE   NAME:Jonathan Arroyo, Jonathan Arroyo                     MRN:          [id number]  DATE:06/15/2007                            DOB:          [birth year]    PRIMARY CARDIOLOGIST:  Arturo Morton. Riley Kill, MD, [facility]   Jonathan Arroyo is a very pleasant 77 year old married white male patient  of Dr. Bonnee Quin who has a history of coronary artery disease and  hypertension.  He presents today with worsening symptoms of shortness of  breath.  He says over the past 2- 3 weeks it has significantly worsened.  He could walk on a treadmill. He walks an 18 minute mile and the first 5-  10 minutes are quite difficult. After he gets through that phase he can  go 45 minutes.  He does become quite short of breath, but denies any  chest tightness, pressure, heaviness, dizziness or pre-syncope  associated with this.  He also has shortness of breath sitting and  watching TV or it wakes him up at night.  He has had ago outside take a  deep breath in order to ease his breathing.  He admits to excessive salt  intake.  He eats out twice a week and enjoys fried foods as well as  canned soups at home and but he rarely uses a salt shaker. He does not  feel he has excessive fluid.  He also has a lot of indigestion and  esophagus problems, but he says these are clearly related to eating and  have something to do with his breathing problems.   CURRENT MEDICATIONS:  1. Plavix 75 mg daily.  2. Hydrocodone p.r.n.  3. Aspirin 81 mg daily.  4. Simvastatin 40 mg daily.  5. Temazepam 15 mg daily p.r.n.  6. Protonix 40 mg b.i.d.  7. Enalapril 10 mg daily.   PHYSICAL EXAM:  Is a pleasant 77 year old white male in no acute  distress.  Blood pressure 136/79, pulse 73, weight 202.  Neck is without JVD, HJR, bruit or thyroid enlargement.  LUNGS:  Decreased breath sounds, but they are clear anterior, posterior  and lateral.  HEART:  Regular rate  and rhythm at 70 beats per minute.  Normal S1-S2;  no murmur, rub or bruit, thrill or heave noted.  Abdomen is soft without organomegaly, masses, lesions or abnormal  tenderness.  EXTREMITIES:  Without cyanosis, clubbing or edema. He has good distal  pulses.   IMPRESSION:  1. Dyspnea, rule out anginal equivalent.  2. Coronary artery disease status post coronary artery bypass graft      twice, most recent in 2005.  3. Hypertension.  4. Mild left ventricular dysfunction, ejection fraction of 40% last      evaluation in 2005.  5. Gastroesophageal reflux disease and esophageal problems.   PLAN:  At this time, I do not find any evidence of fluid overload in the  patient today. I have scheduled him for a stress Cardiolite to rule out  underlying ischemia.  I have also asked him to significantly decrease  his sodium intake and he has  an appointment to see Dr. Riley Kill back in 2  weeks.  He is to call if he has any further problems.      Jacolyn Reedy, PA-C  Electronically Signed      Madolyn Frieze. Jens Som, MD, [facility]  Electronically Signed   ML/MedQ  DD: 06/15/2007  DT: 06/15/2007  Job #: [id number]

## 2010-09-30 NOTE — Letter (Signed)
March 22, 2008    Jonathan Arroyo  [address]  [place], [place] [address]   RE:  Jonathan Arroyo  MRN:  [id number]  /  DOB:  [birth year]   Dear Gigi Gin,   I had the pleasure seeing Jonathan Arroyo in the office today in  followup.  Clinically, he seems to be doing extremely well.  His blood  pressure was higher today.  He stopped his Norvasc about 2-3 weeks ago,  saying that he gets out of the [place], and he decided just not to take it  since it came from the [place].  I am not quite sure what he meant and I  tried to probe of about all this and he really was clear why he decided  to stop, but nonetheless, he says that his blood pressure has been a  little rough in the last several weeks, and I suspect that it correlates  with the time that he stopped the amlodipine.   MEDICATIONS:  1. Plavix 75 mg daily.  2. Aspirin 81 mg daily.  3. Simvastatin 40 mg nightly.  4. Clonidine 0.1 mg p.o. b.i.d.  5. Norvasc 7.5 mg daily.  6. Lisinopril 20 mg 2 tablets daily.  7. Protonix 40 mg daily.  8. Finasteride 5 mg daily.   PHYSICAL EXAMINATION:  GENERAL:  He is alert and oriented in no  distress.  VITAL SIGNS:  Blood pressure is 150/90, the pulse is 52.  LUNGS:  The lung fields are clear.  HEART:  There is an S4 gallop.   Electrocardiogram demonstrates sinus bradycardia with left oriented  axis.   To summarize, this gentleman is doing well and we plan to have him  return to see [place] in followup in approximately 6 months' time.  I have  suggested that he go back onto the amlodipine as previously prescribed.  He is scheduled to see you sometime in followup.  He does keep his own  blood pressure so he should have a pretty good idea how he is responding  to this therapy.   Thanks for allowing me to share in his care and we will be happy to see  him at any time.    Sincerely,      Arturo Morton. Riley Kill, MD, [facility]  Electronically Signed    TDS/MedQ  DD: 03/22/2008  DT: 03/23/2008  Job #: [id number]

## 2010-09-30 NOTE — Letter (Signed)
July 29, 2007    Nadine Counts  [address] [place] [address]   RE:  [name], [name]  MRN:  [id number]  /  DOB:  06/21/1929   Dear Gigi Gin:   I had the pleasure seeing Jonathan Arroyo back today in followup.  T  briefly summarize, his blood pressure has not improved too much since  the clonidine was added, and since you increased the dose.  He has had a  lot of shoulder discomfort, that clearly musculoskeletal.  He has seen  Dr. Eulah Pont and then subsequently seen Dr. Anne Hahn downstairs and been  started on a steroid course.  He clearly is tender in the shoulders, and  has difficulty raising his arms.   Today on exam, the blood pressure is 180/100, the pulse is 62.  The lung  fields are clear, and the cardiac rhythm is regular.   I have elected to add Norvasc to his regimen.  It is not clear to me  that he has been on this in the past, reviewing his old medication  records.  He will take 5 mg daily.  He is coming back in here on Monday  for repeat blood pressure check, and if it is not improved, we will  increase to 10 mg.  As I mentioned to him, much of this may be related  to both pain and prednisone, although blood pressure did start earlier.  I will send you an update, when we see him.    Sincerely,      Arturo Morton. Riley Kill, MD, [facility]  Electronically Signed    TDS/MedQ  DD: 07/29/2007  DT: 07/30/2007  Job #: [id number]

## 2010-09-30 NOTE — Assessment & Plan Note (Signed)
OFFICE VISIT   Jonathan Arroyo, Jonathan Arroyo  DOB:  [birth year]                                       02/15/2008  EAVWU#:[id number]   The patient is a 77 year old male referred for evaluation of abdominal  aortic aneurysm.  He had a recent CT which showed that a known abdominal  aortic aneurysm had increased in size.  He states that he has known  about the aneurysm since 1994.  This has been followed intermittently  with serial scans.  His recent CT scan was for evaluation for hematuria.  This was a noncontrast scan, which shows that the aneurysm is infrarenal  in character and 4.1 cm in diameter.  There was no iliac involvement.  He denies any significant abdominal or back pain.  His atherosclerotic  risk factors include hypertension, coronary artery disease and elevated  cholesterol.  He has a remote history of congestive heart failure but  has not had any of this since 1994.   PAST SURGICAL HISTORY:  He had coronary bypass grafting in 1994 with  redo coronary artery bypass grafting in 2005.  He has previously had the  left and right greater saphenous veins harvested.  He also had a left  rotator cuff repair.  He has had a right total knee replacement and 3  previous back operations.   PAST MEDICAL HISTORY:  As mentioned above.   FAMILY HISTORY:  Unremarkable.  He denies any history of aneurysm.   SOCIAL HISTORY:  He is married.  He is retired.  He has 6 children.  He  is a nonsmoker, non-consumer of alcohol.   REVIEW OF SYSTEMS:  CONSTITUTIONAL:  He is 6 feet 2 inches, 190 pounds.  CARDIOVASCULAR:  He denies recent history chest pain or shortness of  breath.  He can time to flights of stairs without becoming short of  breath and walks 30 minutes on treadmill daily.  PULMONARY:  Negative.  GI:  He has a history of reflux, hiatal hernia and intermittent  constipation.  RENAL:  He has some urinary frequency.  VASCULAR:  He denies history of TIA or stroke.  NEUROLOGIC:  He has no history of headaches, seizures or syncopal  episodes.  ORTHOPEDIC:  He has left knee pain which is being evaluated for some  type of repair.  He also has chronic left foot numbness and some balance  issues in his left leg.  He does not know if this is related to his  previous back problems.  Psychiatric, ENT and hematologic review of systems are negative.   MEDICATIONS:  Include lisinopril 20 mg twice a day, simvastatin 40 mg  once a day, finasteride 5 mg once a day, Plavix 75 mg once a day,  oxybutynin 5 mg once a day, clonidine 0.1 mg twice a day, Protonix 40 mg  once a day, aspirin 81 mg once a day, hydrocodone p.r.n., Ambien p.r.n.   He is allergic to Mylanta, which causes his tongue to swell, and  Percocet, which causes agitation.   PHYSICAL EXAM:  Blood pressure is 171/74 in the left arm, pulse is 49  and regular.  HEENT is unremarkable.  Neck has 2+ carotid pulses without  bruit.  Chest:  Clear to auscultation.  Cardiac exam, regular rate  rhythm.  Abdomen:  Soft, nontender, nondistended with a pulsatile mass  in the epigastrium.  He has 2+ carotid, brachial, radial, femoral, and  dorsalis pedis pulses.  He has 3+ popliteal pulses bilaterally.  These  are fairly full in character.   He has no carotid bruits.  Chest:  Clear to auscultation.  Cardiac exam  is regular rate and rhythm without murmur.   In light of the full popliteal artery exam, he had bilateral popliteal  ultrasound which showed that the right popliteal artery was 0.89 cm in  diameter and the left was 0.99 cm in diameter.   In summary, the patient has an asymptomatic 4.1 cm abdominal aortic  aneurysm.  This does not warrant repair at this time because its size  diameter.  However, I did discuss with the patient the risk of rupture  is less than 1% per year.  However, it does warrant close followup,  since it has been growing slowly over time.  We will repeat his aortic  ultrasound in 6  months' time to ensure that he has not had continued  growth.  If the aneurysm diameter reaches 5 to 5.5 cm in diameter, we  will consider repair at that time.  He did not have evidence of  popliteal aneurysms.  He will follow up with me in 6 months' time.   [name], MD  Electronically Signed   CEF/MEDQ  D:  02/16/2008  T:  02/16/2008  Job:  [id number]   cc:   Vear Clock. Riley Kill, MD, [facility]

## 2010-09-30 NOTE — Procedures (Signed)
DUPLEX ULTRASOUND OF ABDOMINAL AORTA   INDICATION:  Followup abdominal aortic aneurysm.   HISTORY:  Diabetes:  No.  Cardiac:  Yes.  Hypertension:  Yes.  Smoking:  No.  Connective Tissue Disorder:  Family History:  No.  Previous Surgery:  No.   DUPLEX EXAM:         AP (cm)                   TRANSVERSE (cm)  Proximal             1.84 cm                   1.79 cm  Mid                  4.29 cm                   4.27 cm  Distal               2.28 cm                   2.28 cm  Right Iliac          0.98 cm                   Cm  Left Iliac           1.01 cm                   cm   PREVIOUS:  Date:  06/24/2009  AP:  4.22  TRANSVERSE:  4.35   IMPRESSION:  Abdominal aortic aneurysm noted with largest measurement of  4.29 x 4.27 cm.   ___________________________________________  [name], MD   EM/MEDQ  D:  02/13/2010  T:  02/13/2010  Job:  [id number]

## 2010-10-03 NOTE — Assessment & Plan Note (Signed)
[facility]                            CARDIOLOGY OFFICE NOTE   NAME:Jonathan Arroyo, Jonathan Arroyo                     MRN:          [id number]  DATE:05/25/2006                            DOB:          1929-11-01    [name] is in for follow up.  He has had 2 prior bypass operations.  He continues to have some mild shortness of breath.  The patient  underwent an echocardiographic study on June 21, 2006.  Ejection  fraction was in the range of 40-45% with mildly reduced aortic valve  excursion.  There was also some mild valvular regurgitation.  The left  atrium was dilated, and overall RV systolic function was mildly reduced.  BNP done at that time was 123, and troponin was normal.  When he saw Dr.  Samule Ohm, he was modestly hypertensive.  Laboratory studies were done,  which revealed a hemoglobin of 15.8, white count of 7200.  The BUN and  creatinine were normal at 14 and 1.1.  Myocardial perfusion study was  also ordered.  These were reviewed with the patient today.  There was an  old inferior scar at the base with perhaps slight ischemia at the apex  and septal motion was with prior CABG with ejection fraction at 40%.  He  is perhaps slightly improved.  He has not started hydrochlorothiazide.   MEDICATIONS:  1. Plavix 75 mg daily.  2. Hydrocodone p.r.n.  3. Aspirin 81 mg daily.  4. Enalapril 10 mg b.i.d.  5. Simvastatin 40 daily.  6. Temazepam 15 mg p.r.n.  7. Protonix 40 mg daily.  8. Ranitidine 300 mg daily.   PHYSICAL EXAMINATION:  GENERAL:  [name] actually looks quite good.  VITAL SIGNS:  The blood pressure is 122/76.  The lung fields are clear.  CARDIAC:  The cardiac rhythm is regular with a soft systolic ejection  murmur.   Overall, he is improved.  He is taking the diuretics on a p.r.n. basis.  I might be inclined to continue hydrochlorothiazide, but if we do so, he  is going to clearly need to have a follow up basic metabolic  profile.  We will see him in the clinic within about 2 months.     Jonathan Arroyo. Riley Kill, MD, [facility]  Electronically Signed    TDS/MedQ  DD: 06/28/2006  DT: 06/28/2006  Job #: [id number]

## 2010-10-03 NOTE — Cardiovascular Report (Signed)
[place]  Patient:    Jonathan Arroyo, Jonathan Arroyo                     MRN: [id number] Proc. Date: 11/03/99 Adm. Date:  [id number] Attending:  Talitha Givens CC:         Nadine Counts, M.D.             Arturo Morton. Riley Kill, M.D. [facility]             Peter C. Eden Emms, M.D. [facility]             Bruce R. Juanda Chance, M.D. [facility]             [facility]                        Cardiac Catheterization  INDICATION:  Mr. Matera is 77 years old and has had previous bypass surgery in 1993.  He recently developed the onset of chest pain and developed severe chest pain at rest and was admitted to the hospital on November 01, 1999.  He was studied today by Dr. Noralyn Pick. Nishan and during the diagnostic study, he occluded the vein graft to the right coronary artery which resulted in polymorphic ventricular tachycardia and complete heart block requiring temporary pacing; he also was hypotensive.  Dr. Eden Emms called me to perform intervention on the saphenous vein graft to the right coronary artery.  He was pain-free and his rhythm and blood pressure were stable when we started the procedure.  DESCRIPTION OF PROCEDURE:  The procedure was performed via the right femoral artery using an arterial sheath and a 6-French right bypass graft guiding catheter.  We used a short Patriot wire and crossed the lesion in the vein graft to the right coronary artery without difficulty.  We direct-stented with a 3.0 18-mm NIR Elite stent and performed one inflation of 14 atmospheres for 53 seconds.  Following this deployment, there was a distal filling defect which we thought represented an edge tear, as well as a slight filling defect in the proximal portion of the stent.  For this reason, we elected to place a second 3.0 18-mm stent, which we placed distal to the first stent and slightly overlapping.  We deployed this with one inflation of 13 atmospheres for 43 seconds and then we deployed  in the middle of the two stents with one inflation of 15 atmospheres for 32 seconds.  We did a final inflation of 12 atmospheres for 40 seconds at the proximal edge of the proximal stent where the filling defect was present.  Repeat diagnostics were then performed through the guiding catheter.  Patient tolerated the procedure well and left the laboratory in satisfactory condition.  RESULTS:  Initially, the stenosis in the midportion of the stent to the right coronary artery was estimated at 99%.  Following stenting, this improved to 0%.  The distal edge tear was corrected with the second stent and there was no residual stenosis.  There was a slight step-up into the proximal stent and slight step-down from the distal stent into the native vein graft.  CONCLUSION:  Successful placement of tandem overlying stents in the midportion of the saphenous vein graft to the right coronary artery with a pre-stent narrowing from 99% to 0%.  The flow was TIMI-3 before and after stent implantation.  DISPOSITION:  Patient was returned to the postangioplasty unit for further observation.  He does  have distal disease in the vein graft estimated at 50%, so he will be at risk for recurrence.  We did a left ventriculogram at the end of the procedure which showed good wall motion with no significant areas of hypokinesis and an estimated ejection fraction of approximately 60%. DD:  11/03/99 TD:  11/05/99 Job: [id number]

## 2010-10-03 NOTE — Assessment & Plan Note (Signed)
[facility]                            CARDIOLOGY OFFICE NOTE   NAME:[place], TEVIS DUNAVAN                     MRN:          [id number]  DATE:05/25/2006                            DOB:          1929-07-07    Mr. Harr is in for followup.  He saw Geralynn Rile last week and had  some modest shortness of breath.  Laboratory studies were done on that  day and revealed normal renal function, with a normal hematocrit.  A BNP  was done and his troponin was negative.  The BNP was 123.  He  subsequently has undergone a myocardial perfusion study, and this  demonstrates an old inferior scar at the base with slight ischemia at  the apex.  Septal motion was consistent with prior CABG and ejection  fraction was calculated at about 40%.  This is down from the  preoperative finding.  Echocardiographic study was also done.  This  revealed an ejection fraction of 40% to 45% with mild increase in the  aortic valve thickness and mildly reduced excursion.  Importantly, there  was only mild mitral regurgitation noted.  The patient has been better  over the past few days.  He did take one dose of diuretics, but he does  not like taking diuretics on a regular basis.  He is not having ongoing  active chest pain, and was able to walk on the treadmill again today.   MEDICATIONS:  1. Plavix 75 mg daily.  2. Aspirin 81 mg daily.  3. Enalapril 10 mg p.o. b.i.d.  4. Simvastatin 40 daily.  5. Temazepam 15 mg p.r.n.  6. Protonix 40 mg b.i.d.  7. Hydrochlorothiazide, which he is not taking.  8. Ranitidine.   PHYSICAL EXAMINATION:  The blood pressure is now 122/76 and the pulse is  65.  The lung fields are entirely clear and the jugular veins are not  distended.  There is a minimal apical systolic murmur.  He does not have significant extremity edema.   Mr. Davidian may have some borderline volume overload by history.  He  does get short of breath when he lays down at night.   He does not want  to take the diuretic on a regular basis, but we did talk about possibly  taking furosemide twice a week as he takes this on a p.r.n. basis when  he feels badly.  We will see him back in followup in about 6 weeks to  see how he is progressing.  He will take a little potassium  supplementation if he takes his diuretic twice a week.  Should his  symptoms progress, then he is to call us.  Regarding his left  ventricular function, it is down slightly from his preoperative study.  The findings are largely in the inferior base and could be related to  either the distal circumflex system, the apical ischemia also is notable  in that he is known post bypass to have an 80% apical stenosis.  We plan  to see him  back in followup in 6 weeks, and we will reassess his status at  that  time.  He will continue on medical management.     Arturo Morton. Riley Kill, MD, [facility]  Electronically Signed    TDS/MedQ  DD: 05/25/2006  DT: 05/26/2006  Job #: [id number]   cc:   Nadine Counts

## 2010-10-03 NOTE — Cardiovascular Report (Signed)
NAME:  Jonathan Arroyo, Jonathan Arroyo                        ACCOUNT NO.:  [id number]   MEDICAL RECORD NO.:  [id number]                   PATIENT TYPE:  OIB   LOCATION:  3711                                 FACILITY:  [facility]   PHYSICIAN:  Arturo Morton. Riley Kill, M.D.             DATE OF BIRTH:  Nov 29, 1929   DATE OF PROCEDURE:  06/27/2003  DATE OF DISCHARGE:                              CARDIAC CATHETERIZATION   INDICATIONS:  Mr. Rottenberg is well known to me.  He has previously undergone  revascularization surgery and done well.  He has had multiple stent  procedures in the remaining saphenous vein grafts.  The current study was  done to assess coronary anatomy.  Risks, benefits and alternatives were  discussed with the patient in detail.   PROCEDURES:  1. Left heart catheterization.  2. Selective coronary arteriography.  3. Selective left ventriculography.  4. Aortic root aortography.  5. Saphenous vein graft angiography times two.  6. Selective left internal mammary angiography.   DESCRIPTION OF PROCEDURE:  The patient was brought to the catheterization  laboratory and prepped and draped in the usual fashion.  Through an anterior  puncture, the right femoral artery was easily entered.  A 6 French sheath  was placed.  Views of the left and right coronary arteries were obtained.  Following this, vein graft angiography was performed times two as well as  internal mammary angiography.  The patient then experienced some shoulder  pain and we were not sure as to the etiology of this.  An aortic root shot  was done to make sure that the origin of the left subclavian remained  patent, although he had excellent pulses in the arms.  Following this, we  took a selective view of the left subclavian as well.  Ventriculography was  performed in the RAO projection.  With taking the patient off of the table  and repositioning him, he had marked relief of the discomfort.  He was taken  to the holding area in  satisfactory clinical condition.   HEMODYNAMIC DATA:  1. Central aortic pressure:  133/63 with a mean of 90.  2. Left ventricular pressure:  137/12.  3. No gradient on pullback across the aortic valve.   ANGIOGRAPHIC DATA:  1. Ventriculography was performed in the RAO projection.  Overall systolic     function was well preserved.  No definite wall motion abnormalities were     seen.  2. The aortic root appeared to be relatively normal.  The takeoff of the     three grade vessels was visualized and there was no evidence of     dissection.  3. The coronary arteries are heavily calcified, as witnessed on the     fluoroscopy.  4. The left main coronary artery demonstrates about a 75 to 80% stenosis at     the takeoff involving the origin of the circumflex.  The LAD itself was  totally occluded just after the takeoff of the small intermediate vessel,     which itself has some proximal irregularities.  5. Following the origin of a marginal branch, there is 70 and 90% narrowing     noted in the AV circumflex.  There is some filling noted distally,     although this is likely competitive.  6. The internal mammary to the distal LAD is widely patent.  The distal LAD     has an 80% apical tip stenosis.  7. The saphenous vein graft to two branches of the obtuse marginal     demonstrates severe degenerative disease.  There is a stent site     proximally with about 50% smooth narrowing.  After this there are three     tandem stenoses of 80, 70 and 80 in an area that looks severely     degenerative.  The graft then supplies a marginal branch and then circles     posteriorly to supply a second marginal.  In this area is a previously     placed stent which remains widely patent.  8. The right coronary artery is totally occluded.  9. The saphenous vein graft to the PDA demonstrates multiple areas of     diffuse disease.  The graft itself was fairly small caliber graft, but     there is a 90%  narrowing in the mid portion at a previously stented site.     There is also 75 and 80% disease noted distally.  The PDA is at least     moderate size.   CONCLUSIONS:  1. Well preserved left ventricular function.  2. Continued patency of the internal mammary of the left anterior descending     artery.  3. 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   DISPOSITION:  The patient does have some progressive symptomatology.  He has  progressive and severe multilocation degenerative vein graft disease that is  at risk for closure.  The options at this point would seem to be  consideration of again redo surgery versus an attempted percutaneous  intervention.  The latter is clearly unfavorable because of the severity and  diffuseness of the disease and the age of the grafts.  We will get a repeat  surgical consult with Dr. Kathlee Nations Trigt so that the options can be  considered.  The patient himself is not inclined towards a redo operation,  despite his overall general status, which is relatively good.                                               Arturo Morton. Riley Kill, M.D.    TDS/MEDQ  D:  06/27/2003  T:  06/28/2003  Job:  [id number]   cc:   Nadine Counts  [address]  [place]  [place] [address]  Fax: [phone]   Arturo Morton. Riley Kill, M.D.   CV Laboratory   [facility]rielle Dare. [facility] chart

## 2010-10-03 NOTE — Assessment & Plan Note (Signed)
Jonathan Arroyo                            CARDIOLOGY OFFICE NOTE   NAME:Jonathan Arroyo, Jonathan Arroyo                     MRN:          [id number]  DATE:07/07/2006                            DOB:          1929/10/29    Jonathan Arroyo is in for a followup visit.  In general, he is doing  reasonably well, but his wife says he feels poorly every day.  He did go  to see Dr. Donata Clay because of a sternal bolt that is slightly  protruding.  This has been addressed.  He does have the shortness of  breath and it will wake him up at times, at night.  Importantly, the  patient is also, however, able to walk on a treadmill fairly regularly,  without any significant dyspnea.  He denies chest pain currently.   On his physical examination today, the blood pressure is 159/87, the  pulse is 65.  The jugular veins are not distended.  The lung fields are  clear to auscultation and percussion.  Cardiac rhythm is regular with a  normal first and second heart sound.  The abdomen is soft.  There is no  extremity edema.   IMPRESSION:  1. Coronary artery disease with prior multiple bypass graft surgery.  2. Mild left ventricular dysfunction.  3. Recurrent episodes of shortness of breath of uncertain etiology      with fairly good exercise tolerance.  4. Systemic hypertension, borderline control.   PLAN:  1. I have asked him to resume and use hydrochlorothiazide 12.5 mg      daily.  2. I have asked him to return to Dr. Juliann Pulse office in order to get a      basic metabolic profile in one week to make sure that his potassium      holds with this.  3. We will see him back in followup in three to four weeks to reassess      his blood pressure and his symptomatic response.  4. I will review his prior x-ray findings.     Jonathan Arroyo. Riley Kill, MD, [facility]  Electronically Signed    TDS/MedQ  DD: 07/07/2006  DT: 07/07/2006  Job #: [id number]   cc:   Nadine Counts

## 2010-10-03 NOTE — Discharge Summary (Signed)
[place]  Patient:    Jonathan Arroyo, Jonathan Arroyo                     MRN: [id number] Adm. Date:  [id number] Disc. Date: [id number] Attending:  Ronaldo Miyamoto Dictator:   Lavella Hammock, P.A. CC:         Nadine Counts, M.D.; [place]  Dr. Katrinka Blazing; [place], [facility] in [place], [place]   Referring Physician Discharge [place]  DATE OF BIRTH:  06-06-1929  PROCEDURE: 1. Cardiac catheterization. 2. Coronary arteriogram. 3. Left ventriculogram. 4. Percutaneous transluminal coronary angioplasty and stent of one vessel with    percutaneous transluminal coronary angioplasty of another vessel.  HOSPITAL COURSE:  Mr. Vinsant is a 77 year old male with known coronary artery disease who was seen in the office on November 08, 2000 for epigastric pain.  He also complained of some dizziness.  His heart rate was 57.  The mid epigastric pain was consistently relieved by nitroglycerin.  It was felt that this was anginal pain and he was scheduled for a cardiac catheterization.  He came into the hospital on November 09, 2000 for cardiac catheterization.  The cardiac catheterization showed 95% left main with total LAD and a patent LIMA to LAD.  The circumflex had an 80% stenosis in OM1 and the SVG to circumflex had a 90% stenosis.  He had an RCA that was totaled with the SVG to RCA having a 20% in-stent restenosis.  He had PTCA and stent of the mid SVG to OM graft reducing that stenosis to 0 and he had PTCA of the distal circumflex graft insertion reducing that stenosis from 90% to 20%.  The patient tolerated the procedure well and a sheath was removed without difficulty.  He had also a distal occlusion of the OM that was not accessible for percutaneous intervention.  He had an EKG checked post procedure and his heart rate was in the low 50s.  It was felt that this was secondary to Atenolol and might be responsible for his dizziness.  Because of that his Atenolol  was discontinued. Post procedure he had some chest pain that was relieved with morphine and an MB that was elevated at 20.  Because of that he was kept an extra day.  He was seen by cardiac rehabilitation and they discussed risk factor modifications, use of nitroglycerin, calling 911, and restrictions.  The patient on November 11, 2000 had no further chest pain, no difficulties, and was tolerating the medications well.  His blood work showed some hypokalemia so he was started on a potassium supplement.  He was considered stable for discharge on November 11, 2000.  LABORATORIES:  Hemoglobin 14.1, hematocrit 39.7, WBC 6.0, platelets 185,000. Sodium 140, potassium 3.7, chloride 101, CO2 32, BUN 15, creatinine 1.0, glucose 90.  Post procedure CK-MB 179/20.4.  CONDITION ON DISCHARGE:  Improved.  CONSULTS:  None.  COMPLICATIONS:  None.  DISCHARGE DIAGNOSES:  1. Coronary artery disease status post percutaneous transluminal coronary     angioplasty and stent to the mid saphenous vein graft to obtuse marginal     graft with percutaneous transluminal coronary angioplasty to the distal     circumflex graft insertion.  2. Status post aortocoronary bypass surgery in 1994 with left internal     mammary artery to left anterior descending, saphenous vein graft to obtuse     marginal and circumflex, and saphenous vein graft to right coronary     artery.  3. Mildly decreased left ventricular function with an ejection fraction of     50%.  4. History of spinal stenosis.  5. History of nephrolithiasis.  6. Family history of coronary artery disease.  7. ______.  8. Hypertension.  9. Dizziness possibly secondary to bradycardia. 10. Possible history of transient ischemic attack.  DISCHARGE INSTRUCTIONS:  His activity level is to include no lifting, driving, sexual exertion, or heavy exertion for a week.  He is to stick to a diet that is low in salt, fat, and cholesterol.  He is to observe the  catheterization site and call the office with problems.  He has a P.A. appointment on July 11 at 12:15.  He is to see Dr. Nadine Counts and the [facility] doctors as needed.  DISCHARGE MEDICATIONS: 1. Plavix 75 mg q.d. 2. Coated aspirin 325 mg q.d. 3. HCTZ 25 mg q.d. 4. Zantac 150 mg b.i.d. 5. Lopid 600 mg b.i.d. 6. Nitroglycerin p.r.n. 7. He is to hold vitamin E for now secondary to easy bruising. 8. He is to not take atenolol secondary to bradycardia. DD:  11/11/00 TD:  11/11/00 Job: [id number]

## 2010-10-03 NOTE — Op Note (Signed)
NAME:  TREVAR, BOEHRINGER                        ACCOUNT NO.:  [id number]   MEDICAL RECORD NO.:  [id number]                   PATIENT TYPE:  INP   LOCATION:  5004                                 FACILITY:  [facility]   PHYSICIAN:  Loreta Ave, M.D.              DATE OF BIRTH:  [birth year]   DATE OF PROCEDURE:  12/18/2002  DATE OF DISCHARGE:                                 OPERATIVE REPORT   PREOPERATIVE DIAGNOSIS:  End-stage degenerative arthritis, right knee with  valgus alignment.   POSTOPERATIVE DIAGNOSIS:  End-stage degenerative arthritis, right knee with  valgus alignment.   PROCEDURE:  Right total knee replacement utilizing Osteonix prosthesis.  Cemented posterior  stabilizer #9 femoral component. Cemented #11 tibial  component. A 10-mm thick posterior stabilized flex polyethylene tibial  insert. Cemented recessed 30-mm patellar component. Soft tissue  balancing  with lateral  retinacular release.   SURGEON:  Loreta Ave, M.D.   ASSISTANT:  Arlys John D. Petrarca, P.A.-C.   ANESTHESIA:  General.   ESTIMATED BLOOD LOSS:  Minimal.   TOURNIQUET TIME:  1 hour, 20 minutes.   SPECIMENS:  Excised bone and soft tissue. No cultures.   COMPLICATIONS:  None.   DRESSINGS:  Soft compressive.   DRAINS:  Hemovac x2.   DESCRIPTION OF PROCEDURE:  The patient was brought to the operating room and  placed in the supine position on the operating table. After adequate  anesthesia had been obtained the tourniquet was applied to the upper  aspect  of the right leg. It was prepped and draped in the usual sterile fashion. It  was exsanguinated with elevation and Esmarch. The tourniquet was inflated to  350 mmHg.   The knee was examined. A mild flexion contracture with further flexion to  130 degrees. Valgus alignment corrected well to about 10 degrees of valgus.  Lateral tracking, tethering patellofemoral joint.   A straight incision above the patella down to the tibial tubercle. A  median  peripatellar arthrotomy. Grade 4 changes throughout. Remnants of menisci,  cruciate ligaments, periarticular spurs all excised. The distal femur  exposed. An intramedullary guide placed.   The distal cut set at 5 degrees of valgus, resecting 10 mm. A size #9  component. A very thin medial femoral condyle with appropriate positioning  for  the femoral component. With  the appropriate jigs, cuts were made for  the #9 component which it was sized for. A trial component was placed and  found to fit well. The trial was removed.   The tibial spine was removed with a saw. The tibial intramedullary guide was  placed with a 5 degree posterior slow cut resecting 6 mm off the deficient  medial side. Sized to a #11 component. The patella was sized, reamed and  drilled for a 30-mm component. The trials were put in place throughout. A #9  on the femur, a #11 on the tibia and a 30-mm  on the patella.   With the 10-mm insert, full extension and full flexion and no liftoff.  Lateral tracking tethering patellofemoral joint  approved after a lateral  release was performed with cautery. All trials were removed.   Cement was prepared and placed on all components currently seated. Excessive  cement was removed. Polyethylene was attached to the tibia. The knee was  reduced. Once the cement had hardened it was reexamined. Prior to insertion  of components, the tibia had been hand reamed for appropriate rotation with  trials. At completion I had full extension and full flexion, nicely  balancing with good stability and reasonable patellofemoral tracking.   The wound was irrigated. Hemovac drains were brought out through separate  stab wounds. The arthrotomy was closed with #1 Vicryl. The skin and  subcutaneous tissue with Vicryl and staples. The margins of the wound and  the knee were injected with Marcaine. The Hemovac was clamped. A sterile  compressive dressing was applied.   The tourniquet was  deflated and removed. The knee immobilizer was applied.  Anesthesia was reversed. The patient was brought to the recovery room. The  patient tolerated the procedure well without complications.                                               Loreta Ave, M.D.    DFM/MEDQ  D:  12/18/2002  T:  12/18/2002  Job:  [id number]

## 2010-10-03 NOTE — Cardiovascular Report (Signed)
[place]  Patient:    Jonathan Arroyo, Jonathan Arroyo                     MRN: [id number] Proc. Date: 11/09/00 Adm. Date:  [id number] Attending:  Ronaldo Miyamoto CC:         Everlene Other, M.D.  CV Laboratory   Cardiac Catheterization  INDICATIONS:  The patient is well known to [place].  He has had prior bypass surgery in 1994.  At that time he had an internal mammary to the LAD, sequential saphenous vein graft to the OM and distal circumflex and vein graft to the distal right.  He has had two graft interventions done to the right graft with a stent placed in the distal right graft recently.  We have entertained the idea of revascularization surgery on a re-do basis, but he was seen in consultation by Dr. Kathlee Nations Trigt, who recommended against this option.  The patient has developed some increasing symptoms lately and because of his March intervention, he was brought back to the catheterization lab today for further evaluation.  PROCEDURES: 1. Left heart catheterization. 2. Selective coronary arteriography. 3. Selective left ventriculography. 4. Saphenous vein graft angiography x2. 5. Selective left internal mammary angiography x1. 6. Percutaneous transluminal coronary angioplasty of the distal circumflex    graft insertion site. 7. Percutaneous transluminal coronary angioplasty and stenting of the mid    graft of the sequential OM distal circumflex graft.  DESCRIPTION OF PROCEDURE:  The patient was brought to the catheterization lab and prepped and draped in the usual fashion.  Through an anterior puncture the right femoral artery was easily entered.  Native angiography was performed as was internal mammary angiography and vein graft angiography x2. Ventriculography was performed in the RAO projection.  There appeared to be no evidence of re-stenosis in the previously placed stents in the saphenous vein graft to the distal right, but there was  progression of the disease in the saphenous vein graft to the OM and distal circumflex with occlusion of the antegrade portion of the distal circumflex, and progressive haziness at the insertion site into the AV portion which supplied a good portion of the posterolateral segment.  In addition, there was progression of disease more proximally.  We elected to recommend percutaneous intervention at this point in time.  Preparations were then made for intervention.  Heparin was given according to protocol.  We used a JR4 6 [place] guide.  A long Hi-Torque Floppy wire was passed across the lesions with ACT in excess of 200 seconds.  Integrilin bolus was given according to protocol.  The insertion site was then dilated using a 2.75 mm Quantum Ranger balloon up 8 or 9 atmospheres.  It was our feeling that this area should not be stented because of some discrepancy in size.  We also elected to directly stent the more proximal vein graft and this appeared to be fairly clean, and there was a fair amount of lumpy, bumpy irregularity more distally in the graft and so PercuSurge was not used.  The lesion was primarily stented up to 14 atmospheres.  The patient then did experience some chest pain and ST elevation, but this was relieved rather promptly using intracoronary verapamil.  The pain was subsequently relieved within 5-10 minutes and ST segments came down.  Following this, further dilatations were performed at the distal insertion site.  He tolerated the procedure well and was subsequently taken to the holding area  in satisfactory clinical condition.  HEMODYNAMIC DATA: 1. Aortic 140/67. 2. LV 130/6. 3. No gradient on pullback across the aortic valve.  ANGIOGRAPHIC DATA: 1. On plain fluoroscopy there was extensive calcification of the left coronary    tree in particular. 2. The left main coronary artery had a subtotal occlusion that was measured    at 90-95% distally.  This led into a large  marginal branch.  There was a    sub-branch that appeared to have competitive filling suggesting a distal    filling of this vessel by a vein graft.  The LAD proper was totally    occluded. 3. The left internal mammary to the distal LAD was widely patent. 4. There was a saphenous vein graft that inserted into the OM and into    a distal circumflex posterolateral system.  There was progression of    disease in the mid graft of 70%.  This was reduced to 0% following    stenting.  This inserted into the OM.  The distal circumflex insertion    site demonstrated total occlusion of the circumflex marginally distally,    whereas the AV portion had about an 80-90% area of hazy stenosis.    Following prolonged balloon dilatation with a 2.75 mm balloon, this    was reduced to 20% narrowing. 5. The right coronary artery was totally occluded. 6. The vein graft to the distal right had about 20% narrowing at the previous    stent site.  It inserted into the PDA.  The PDA and posterolateral    system were provided and the OM which was occluded filled by retrograde    collaterals.  LEFT VENTRICULOGRAPHY:  Ventriculography revealed well preserved global systolic function with perhaps minimal inferobasal hypokinesis.  Ejection fraction was greater than 55%.  CONCLUSIONS: 1. Continued patency of the internal mammary to the left anterior descending. 2. Continued patency of the saphenous vein graft to the posterior descending    artery at the previous site of stenting. 3. Progression of disease in the circumflex sequential graft to the obtuse    marginal and distal circumflex with successful percutaneous stenting    of the mid graft and successful percutaneous angioplasty of the distal    insertion site. 4. Well preserved overall left ventricular function.  DISPOSITION:  The patient has two potential sites for ischemia.  One is in the distribution of the distal circumflex as described.  The second is in  the  distribution of the circumflex that is supplied by the native left main. However, there is some distal filling into this location.  At the present time, Dr. Donata Clay had seen the patient in consultation in March and recommended against re-do CABG.  The mammary remains intact.  The vein graft to the distal right remains intact.  There is progression of disease in the circumflex vein graft but this was dealt with in a percutaneous fashion.  We will recommended continued medical therapy. DD:  11/09/00 TD:  11/10/00 Job: [id number]

## 2010-10-03 NOTE — Discharge Summary (Signed)
NAME:  Jonathan Arroyo, Jonathan Arroyo                        ACCOUNT NO.:  [id number]   MEDICAL RECORD NO.:  [id number]                   PATIENT TYPE:  INP   LOCATION:  2001                                 FACILITY:  [facility]   PHYSICIAN:  Kerin Perna, M.D.               DATE OF BIRTH:  1929/10/21   DATE OF ADMISSION:  07/04/2003  DATE OF DISCHARGE:  07/09/2003                                 DISCHARGE SUMMARY   HISTORY OF PRESENT ILLNESS:  This is a 77 year old hypertensive white male  who was asked to be evaluated by Dr. Donata Clay for consideration of  potential redo cardiac revascularization.  The patient is status post  coronary artery bypass grafting x4 nine years ago.  At that time, the left  internal mammary artery was grafted to his LAD and a vein graft was placed  to the posterior descending and a sequential saphenous vein graft was placed  to the OM2 and posterolateral circumflex.  The patient developed recurrent  disease of his vein grafts and he had a percutaneous intervention with  angioplasty to the right vein graft and angioplasty and stent of the  circumflex vein graft.  He underwent a recent re-look cardiac  catheterization due to dyspnea on exertion with some intermittent chest  pains.  Cardiac catheterization on June 27, 2003 showed an ejection  fraction of 55% and left ventricular end-diastolic pressure of 12 mmHg.  His  native left main had an 80% stenosis.  The native LAD was occluded.  The  native circumflex had a 95% stenosis and the native right coronary artery  was occluded.  The left internal mammary artery graft to the LAD was patent,  the sequential saphenous vein graft to the circumflex marginal and  posterolateral had a diffuse 80% narrowing with a patent stent, and the vein  graft tot the posterior descending had diffuse 90% narrowing.  After  evaluation, Dr. Donata Clay felt the patient was a candidate for redo coronary  artery bypass grafting and the patient was  admitted this hospitalization for  the procedure.   PAST MEDICAL HISTORY:  1. Degenerative arthritis.  2. Hypertension.  3. Gastroesophageal reflux disease.  4. Spinal stenosis.  5. Status post coronary artery bypass grafting x4.  6. Status post right total knee replacement.   ALLERGIES:  1. MYLANTA.  2. PERCOCET.  3. ZOCOR.   MEDICATIONS ON ADMISSION:  1. Hydrochlorothiazide 25 mg daily.  2. Atenolol 12.5 mg daily.  3. Aciphex 20 mg daily.  4. Plavix 75 mg daily.  5. Aspirin 325 mg daily.  6. Nitroglycerin p.r.n.  7. Gemfibrozil 600 mg b.i.d.   FAMILY HISTORY/SOCIAL HISTORY/REVIEW OF SYSTEMS AND PHYSICIAN EXAM:  Please  see the history and physical done as an outpatient.   HOSPITAL COURSE:  The patient was admitted electively on July 04, 2003  and was taken to the cardiac operating room where he underwent the following  procedure:  Redo coronary artery bypass grafting x2.  The following grafts  were placed:  A right radial artery graft to the posterior descending and a  saphenous vein graft to the circumflex marginal.  The patient tolerated the  procedure well and was taken to the surgical intensive care unit in stable  condition.  The patient did require a transfusion for perioperative anemia,  3 units, for a hemoglobin of less than 8 and additionally had some Plavix-  associated coagulopathy which responded to platelet transfusion.   POSTOPERATIVE HOSPITAL COURSE:  The patient has done quite well.  The  patient did have a slight elevation of his CK-MB's postoperatively but these  trended back to the normal range after initial elevation to 190.  The  patient was kept on Imdur during the postoperative period for his radial  artery graft.  He required subsequent transfusion postoperative day  #2 for a hemoglobin of 8.2 in the setting of subendocardial myocardial  infarction.  The patient did require atrial pacing in the early  postoperative period but this was  discontinued and he maintained his normal  sinus rhythm with no significant dysrhythmias.  Hemoglobin and hematocrit  have stabilized at 10 and 29 on July 07, 2003.  Electrolytes, BUN, and  creatinine are within normal limits.  The patient has tolerated a graduate  increase in activity commensurate to level of postoperative convalescence  using cardiac rehabilitation Phase I modalities.  His incisions are healing  well without sinus infection.  He is tolerating a diet.  Oxygen has been  weaned and he maintained adequate saturations on room air.  The patient does  have a moderate postoperative fluid overload but is responding well to  diuretics.  This will be continued at time of discharge.  Overall, the  patient is felt to be tentatively quite stable for discharge in the morning  of July 09, 2003 pending morning round reevaluation.   FINAL DIAGNOSES:  1. Severe recurrent coronary artery disease as described above, now status     post redo coronary artery bypass grafting x2.  2. Postoperative anemia and coagulopathy, stable and improved.  3. Degenerative arthritis.  4. Hypertension.  5. Gastroesophageal reflux disease.  6. Spinal stenosis.  7. History of a right total knee replacement.   DISCHARGE MEDICATIONS:  1. Aciphex 20 mg daily.  2. Imdur 15 mg daily for the next month.  3. Toprol XL 25 mg daily.  4. Aspirin 325 mg daily.  5. Lasix 40 mg daily for seven days.  6. K-Dur 20 mEq daily for seven days.  7. For pain, Ultram 1 or 2 every six hours as needed.   ACTIVITY:  He will be given written instructions in regard to activities as  well as diet.   WOUND CARE AND FOLLOWUP:  Follow up with Dr. Donata Clay in three weeks,  cardiology in two weeks.   CONDITION ON DISCHARGE:  Stable and improved.      Rowe Clack, P.A.-C.                    Kerin Perna, M.D.   Sherryll Burger  D:  07/08/2003  T:  07/09/2003  Job:  [id number]   cc:   [facility]   [address]   [address]  [place]  [place] [address]  Fax: [phone]

## 2010-10-03 NOTE — Op Note (Signed)
NAME:  Jonathan Arroyo, Jonathan Arroyo                        ACCOUNT NO.:  [id number]   MEDICAL RECORD NO.:  [id number]                   PATIENT TYPE:  INP   LOCATION:  2315                                 FACILITY:  [facility]   PHYSICIAN:  Kerin Perna III, M.D.           DATE OF BIRTH:  06-Oct-1929   DATE OF PROCEDURE:  07/04/2003  DATE OF DISCHARGE:                                 OPERATIVE REPORT   PROCEDURE:  Redo coronary artery bypass grafting x2 (right radial artery  graft to posterior descending, saphenous vein graft to circumflex marginal).   PREOPERATIVE DIAGNOSIS:  Class IV progressive angina with severe recurrent  coronary artery disease, status post CABG x4 in 1994.   POSTOPERATIVE DIAGNOSIS:  Class IV progressive angina with severe recurrent  coronary artery disease, status post CABG x4 in 1994.   SURGEON:  Kerin Perna, M.D.   ASSISTANT:  Toribio Harbour, N.P.   ANESTHESIA:  General by Sheldon Silvan, M.D.   INDICATIONS FOR PROCEDURE:  The patient is a 77 year old male who had  undergone coronary artery bypass grafting in 1994 for left main stenosis and  unstable angina.  He has been followed by Arturo Morton. Riley Kill, M.D. with  progressive native coronary artery disease as well as graft disease in his  vein grafts requiring percutaneous intervention.  He now returns with chest  pain and severe diffuse disease in both vein grafts to the right coronary  and circumflex which would not be amenable to further percutaneous therapy.  For that reason, redo coronary artery bypass grafting, although at increased  risk, was felt to be his best option.   Prior to surgery, I examined the patient in his hospital room following his  latest catheterization and reviewed the situation including the indications  and expected benefits of repeat coronary artery bypass graft surgery.  I  discussed the major aspects of the planned procedure including the choice of  conduit to include right radial  artery and saphenous vein graft, the  location of the surgical incisions, use of general anesthesia and  cardiopulmonary bypass, and the expected postoperative hospital recovery.  I  discussed his alternatives to repeat coronary artery bypass graft surgery  since his procedure would be at increased risk.  We discussed the risks  associated with the planned procedure including risks of bleeding, MI,  stroke, injury to the patent mammary artery graft and infection.  He  understood these implications for the surgery, the options, and agreed to  proceed with the operation as planned under what I felt was an informed  consent.   FINDINGS:  The patient has been on Plavix and had severe coagulopathy and  required three to four units of packed cells as well as platelets and FFP  transfusion during surgery.  There were dense adhesions to the mediastinum.  The mammary artery was identified as it came through the pericardium and was  protected during the operation.  The mammary artery more proximally lays  directly under the manubrium and the vessel was not injured during repeat  sternotomy, however, placement of the sternal wires to close the incision  was problematic due to the location of the mammary artery under the  manubrium. A repeat sternotomy would not be possible without injury to the  mammary artery in my opinion.  The radial artery was a good conduit and was  applied to the posterior descending just distal to the previously placed  vein graft. The circumflex marginal was a small, but graftable vessel.   DESCRIPTION OF PROCEDURE:  The patient was brought to the operating room and  placed supine on the operating table where general anesthesia was induced  under invasive hemodynamic monitoring.  The right arm, chest, abdomen, and  legs were prepped with Betadine and draped as a sterile field.  An incision  was made in the right forearm and the radial artery was inspected and found  to be a  good vessel.  Prior to removing the vessel using the Harmonic  scalpel, the patency of the right palmar arch was documented as there was a  strong pulse distal to the radial artery after occluding it with an  atraumatic vascular Bulldog clamp.  The radial artery was harvested and  flushed with a heparin papaverine solution and the incision was closed.  The  arm was wrapped in a sterile dressing and then retucked down the patient's  right side.   The repeat sternotomy was then performed as the saphenous vein was harvested  from the left thigh using endoscopic vein technique. The oscillating saw was  used to enter the chest and there was no injury to underlying vascular  structures.  The mediastinum was carefully dissected of dense vesting  adhesions.  When the anterior surface of the heart, the ascending aorta, and  right atrium were dissected, the sternal retractor was placed.  The mammary  pedicle coming through the pericardium to the LAD was identified and  encircled with a soft vessel loop.  The aortic pursestring for cannulation  was placed above the site of the previous cannulation which was in the  proximal arch.  The atrial pursestring was placed in the main body of the  right atrium and the patient was cannulated after ACT was documented as  being therapeutic.  The Aprotinin protocol was used for this operation.   The patient was placed on bypass and the remainder of the anterior  dissection was performed with care being taken to avoid handling of the  heavily diseased vein grafts.  The inferior wall of the left ventricle had  to be dissected in order to place the retrograde cardioplegic catheter.  A  cardioplegic catheter with a vent was also placed in the ascending aorta.  The patient was cooled to 30 degrees and the aortic crossclamp was applied.  Cardioplegic arrest was achieved with 800 mL of cold blood cardioplegia delivered in split doses between the antegrade aortic and  retrograde  coronary sinus catheters.  Topical iced saline slush was used to augment  myocardial preservation and a pericardial insulator pad was used to protect  the left phrenic nerve.   The distal coronary anastomoses were performed.  The first distal  anastomosis was to the distal posterior descending.  This was placed just  distal to the heavily diseased vein graft.  It was a 1.7 mm vessel with a  proximal 95% stenosis and the radial artery was sewn end-to-side with  a  running 8-0 Prolene with good flow through the graft.   Next, the lateral surface of the ventricle was dissected from investing  adhesions and the old vein graft was identified.  The mammary artery was  occluded with an atraumatic vascular Bulldog clamp during the period of  crossclamp.  The cardioplegia was delivered every 20 to 30 minutes and  septal temperature remained less than 14 degrees during crossclamp.  The  best vessel on the posterior aspect of the left ventricle appeared to be the  OM2 and the anastomosis was placed to this 1.5 mm vessel.  A reversed  saphenous vein was sewn end-to-side with a running 7-0 Prolene.  There was  good flow through the graft.  Cardioplegia was redosed.  The vein graft was  placed behind the mammary pedicle up to the aorta and while still  crossclamped, two proximal anastomoses were performed using a 4.0 mm punch  and running 7-0 Prolene for the radial artery and running 6-0 Prolene for  the vein graft.  Prior to removing the crossclamp, air was vented from the  left side of the heart with a dose of retrograde warm blood cardioplegia.  The aortic crossclamp was then removed.  The heart was reperfused for 30  minutes.  It was cardioverted back to a regular rhythm.  Temporary pacing  wires were applied.  The proximal and distal anastomoses were inspected and  found to be hemostatic.  The mammary artery pedicle was inspected and found  to be patent with a good pulse after removing  the Bulldog and the vessel  loop.  There was significant diffuse oozing and platelets and FFP were  ordered to be given after reversal of heparin with Protamine.  The lungs  were reinflated and the ventilator was resumed.  When the patient had been  reperfused for over 30 minutes and was 37 degrees, he was weaned from bypass  on low dose Dopamine with stable blood pressure and cardiac output.  Protamine was administered without adverse reaction. The cannuli were  removed.  The mediastinum was irrigated with warm antibiotic irrigation.  The leg incision was irrigated and closed in a standard fashion after the  Protamine had been administered.  The patient had definite coagulopathy  probably associated with his preoperative use of Plavix and platelets and  FFP were administered with improvement in coagulation function.  Two  mediastinal chest tubes were placed and brought out through separate incisions.  The sternal wires were placed.  Upon placing the manubrial  wires, it was apparent that the mammary artery was adherent to the underside  of the manubrium on the left side.  Therefore, only a very shallow bite of  the manubrium was possible without injury to the mammary artery.  Double  wires were placed beneath the manubrial wires and a total of eight wires  were placed to close the chest wall.  The pectoralis fascia was closed with  a running #1 Vicryl and the subcutaneous and skin layers were closed with a  Vicryl.  Sterile dressings were applied.  Total bypass time was 140 minutes  and crossclamp time of 100 minutes to perform the two distal and two  proximal anastomoses.  The patient returned to the ICU in stable condition.  Mikey Bussing, M.D.    PV/MEDQ  D:  07/04/2003  T:  07/05/2003  Job:  [id number]   cc:   CVTS Office   [facility]

## 2010-10-03 NOTE — Consult Note (Signed)
NAME:  Jonathan Arroyo, Jonathan Arroyo                        ACCOUNT NO.:  [id number]   MEDICAL RECORD NO.:  [id number]                   PATIENT TYPE:  OIB   LOCATION:  3711                                 FACILITY:  [facility]   PHYSICIAN:  Mikey Bussing, M.D.           DATE OF BIRTH:  May 16, 1930   DATE OF CONSULTATION:  06/27/2003  DATE OF DISCHARGE:                                   CONSULTATION   CARDIOTHORACIC SURGICAL CONSULTATION:   PRIMARY CARE PHYSICIAN:  Dr. Harrison Mons in [place].   REASON FOR CONSULTATION:  Severe recurrent coronary artery disease with  class III angina.   CHIEF COMPLAINT:  Shortness of breath with exertion.   HISTORY OF PRESENT ILLNESS:  I was asked to evaluate this 77 year old  hypertensive white male for potential redo of coronary revascularization for  recently diagnosed severe recurrent coronary artery disease.  The patient is  status post coronary artery bypass grafting x 4 nine years ago.  At that  time, the left IMA was grafted to his LAD, vein graft was placed to the  posterior descending, and a sequential saphenous vein graft was placed to  the om2 and posterolateral circumflex.  He developed recurrent disease of  his vein grafts, and has had percutaneous interventions, with angioplasty to  the right vein graft and angioplasty and stents of the circumflex vein  graft.  His last cardiac catheterization and intervention was in January of  2003, at which time the right vein graft was angioplastied.  He underwent re-  look cardiac cath today due to dyspnea on exertion and some intermittent  chest tightness.  Cardiac cath today demonstrated ejection fraction of 55%  with LVEDP of 12 mmHg.  His native left main had an 80% stenosis.  The  native LAD was occluded.  The native circumflex had a 95% stenosis and the  native right coronary was occluded.  The left IMA graft to the LAD was  patent, the sequential saphenous vein graft to the circumflex marginal and  posterolateral had diffuse 80% narrowings with a patent stent, and the vein  graft to the posterior descending had diffuse 90% narrowing.  Based on the  patient's coronary anatomy, he was felt to be a candidate for consideration  of re-do coronary artery bypass grafting to the right coronary and  circumflex distribution.   PAST MEDICAL HISTORY:  1. Degenerative arthritis.  2. Hypertension.  3. GERD.  4. Spinal stenosis.  5. Status post CABG x 4.  6. Status post right total knee replacement.   ALLERGIES:  He is allergic to MYLANTA, PERCOCET, and ZOCOR.   CURRENT MEDICATIONS:  HCTZ 25 mg q.d., atenolol 12.5 mg q.d., Aciphex 20 mg  q.d., Plavix 75 mg q.d., aspirin 325 mg p.o. q.d., nitroglycerin p.r.n.,  gemfibrozil 600 mg b.i.d.   SOCIAL HISTORY:  He is retired and is married.  He does not smoke or use  alcohol.   FAMILY HISTORY:  Positive for hypertension and coronary disease.   REVIEW OF SYSTEMS:  He denies any systemic symptoms of weight loss or fever.  ENT review is positive for some visual changes, and he is being followed by  an optometrist.  ENT review is negative for difficulty swallowing or active  dental problems.  Pulmonary review is negative for chest trauma or recent  pneumonia, or symptoms of URI.  The chest x-ray and CT scan done last  summer, prior to his knee replacement surgery, showed some scarring at the  left base, and some scarring at the right base, consistent with atelectasis,  no discrete mass.  Cardiac view is positive for his previous heart surgery  and severe recurrent coronary disease.  GI review is positive for GERD,  negative for change in bowel habits or blood per rectum.  GU review is  positive for some hematuria and a previous CT scan showed a probable renal  cyst.  Vascular review is negative for DVT, claudication or TIA.  Endocrine  review is negative for diabetes or thyroid disease.  Hematologic review is  negative for bleeding disorder or  prior blood transfusion.  Neurologic  review is negative for stroke or seizure.   PHYSICAL EXAM:  VITAL SIGNS:  He is 6 feet tall and weighs 190 pounds.  Blood pressure is  130/70.  Pulse is 60 and sinus.  Respiration is 18.   GENERAL APPEARANCE:  Pleasant middle-aged white male in his hospital room  following cardiac catheterization.  No distress.   HEENT:  Normocephalic.  Full EOMs.  Pharynx clear.  Dentition adequate.   NECK:  Without JVD, mass or carotid bruit.   LYMPHATICS:  No palpable supraclavicular or axillary adenopathy.   THORAX:  Without deformity.  He has a well-healed sternal incision.  Breath  sounds are clear bilaterally.   CARDIAC:  Regular rhythm without S3, gallop, murmur or rub.   ABDOMEN:  Soft, nontender, without mass.  There is some mention in his  posterior history of an abdominal aneurysm.  However, I feel no pulsatile  mass.   EXTREMITIES:  No clubbing, cyanosis or edema.  Peripheral pulses are 2+ in  all extremities.  He has a well-healed saphenous vein harvest incision  between the ankle and knee on the left.  He has an incision over the left  knee from the total knee replacement, and there is still some residual  swelling.   NEUROLOGIC:  He is alert and oriented, and he moves all extremities.   LABORATORY DATA:  His chemistries are unremarkable.  His EKG shows a sinus  rhythm with some PACs.  His chest x-ray, taken today, shows no active  disease, with postoperative changes following CABG.   IMPRESSION AND RECOMMENDATIONS:  The patient has severe native three-vessel  disease and severe native left main stenosis.  His vein grafts have now  developed severe, diffuse disease with a patent IMA graft to his LAD.  He  would benefit from re-do vein grafting to the posterior stenting and to the  circumflex vessels.  I have  recommended re-do coronary bypass grafting to the patient, and he will consider it.  We would plan on stopping his Plavix and  bringing him back as  an outpatient for surgery in one week, with surgery scheduled tentatively  for July 04, 2003.  He will remain on his other medications.  Thank you  very much for the consultation.  Mikey Bussing, M.D.    PV/MEDQ  D:  06/27/2003  T:  06/27/2003  Job:  [id number]   cc:   CVTS   [facility]   Harrison Mons, M.D.  [place]

## 2010-10-03 NOTE — Cardiovascular Report (Signed)
[place]  Patient:    Jonathan Arroyo, Jonathan Arroyo                     MRN: [id number] Proc. Date: 07/26/00 Adm. Date:  [id number] Attending:  Ronaldo Miyamoto CC:         Nadine Counts, M.D.  CV Laboratory   Cardiac Catheterization  INDICATIONS:  Mr. Cornette is well known to me.  He is a very delightful, 77 year old, who has previously undergone revascularization surgery.  He has had previous stenting of the saphenous vein graft to the distal right coronary.  He had developed recurrent exertional angina and underwent diagnostic catheterization which revealed a new lesion more distally and there was saphenous vein graft to the distal right and a slightly hazy area at a bifurcation point distally in the circumflex.  The mammary was intact.  We talked about various options and I had him seen in consultation by Dr. Kathlee Nations Trigt.  Dr. Donata Clay felt that the right could be grafted, but that it would be difficult to gain access to the distal portion of the circumflex distribution because of prior CABG surgery.  After various discussions about the various options, it was his feeling that we would be best served by repeat percutaneous intervention of the right graft.  We had planned to do potentially both.  PROCEDURES:  Percutaneous coronary intervention of the saphenous vein graft to the posterior descending artery.  DESCRIPTION OF PROCEDURE:  The patient was brought to the catheterization lab and prepped and draped in the usual fashion.  Through an anterior puncture the left femoral artery was easily entered.  The 7 French sheath was placed. Heparin was given according to protocol and an ACT between 200 and 300 was obtained with this.  Double bolus Integrilin was then utilized.  A right coronary bypass graft catheter with side holes was then utilized to view the right coronary artery.  The lesion was crossed with a long Hi-Torque Floppy wire.  There  was a non-ideal landing zone for PercuSurge and the lesion was dilated using a 2.5 mm Quantum Ranger balloon.  A 20 x 3.0 mm Radius stent was then used to cross this area.  Following placement of the stent, we used a 9 mm x 3.25 Quantum Ranger balloon.  After several dilatations there was evidence of grimace material on the internal aspect of the stent.  The patient did have some mild chest pain and ST elevation but there was a good angiographic response, good antegrade and minimal if any ST elevation. However, based on these findings, we elected not to do the circumflex.  The procedure was then complete without complication and he was taken to the holding area in satisfactory condition.  HEMODYNAMICS:  The central aortic pressure was 142/70.  ANGIOGRAPHIC DATA:  The saphenous vein graft to the posterior descending artery had a previous area of stenting.  This demonstrated about 30% narrowing.  Distally, there was a 90-95% stenosis with about 15 mm of residual vessel beyond the stenosis.  Beyond this, it inserted into a smaller caliber PDA that was about 2 to 2.5 mm in size.  Following the balloon dilatations and the stenting, this was reduced to 10% residual luminal narrowing with a slight layering along the surface of the Radius stent.  There was TIMI-3 flow.  The circumflex graft inserted into a marginal branch and then inserted into a second marginal branch.  There was subtotal occlusion in the  second marginal branch and this was only about 1.5 mm.  There was slight haziness leading into the posterolateral segment of the circumflex system.  CONCLUSIONS: 1. Successful percutaneous stenting of the saphenous vein graft to    the posterior descending artery. 2. Deferral of percutaneous intervention of the saphenous vein graft into    the two marginal branches.  DISPOSITION:  We will discuss timing of the other percutaneous intervention. For the present time, he will be treated with  aspirin and Plavix. DD:  07/26/00 TD:  07/26/00 Job: [id number]

## 2010-10-03 NOTE — Assessment & Plan Note (Signed)
[facility]                            CARDIOLOGY OFFICE NOTE   NAME:FERGUSONKeelon, Zurn                     MRN:          [id number]  DATE:05/20/2006                            DOB:          Mar 02, 1930    PRIMARY CARE PHYSICIAN:  Nadine Counts, MD   PRIMARY CARDIOLOGIST:  Arturo Morton. Riley Kill, MD, [facility]   HISTORY OF PRESENT ILLNESS:  Mr. Thackston is a 77 year old gentleman  with longstanding atherosclerotic coronary disease. He is status post re-  do coronary artery bypass grafting surgery in 2005. He has also had  multiple prior percutaneous interventions. Ejection fraction was last  assessed in 2005 and was normal.   Mr. Parcel has generally done well since his second revascularization.  However, over the past 2 weeks, he has been troubled by severe  exertional dyspnea, several episodes of PND and a persistent feeling  that my body is just not right. He has had a couple of episodes of  chest pain occurring after swallowing food. This is similar to a symptom  he has had in the past. He says he has not had any chest discomfort not  associated with swallowing food. He also describes dizziness. However,  when pushed he means this nonspecific feeling that his body is not  right.   CURRENT MEDICATIONS:  1. Aspirin 81 mg per day.  2. Plavix 75 mg per day.  3. Hydrocodone p.r.n.  4. Enalapril 10 mg twice per day.  5. Simvastatin 40 mg per day.  6. Temazepam p.r.n.  7. Protonix 40 mg twice per day.   PHYSICAL EXAMINATION:  He is well-appearing, thin man in no distress.  His heart rate is 61, blood pressure 160/100 in the left arm and 158/87  in the right. Weight is 193 pounds, which is down 2 pounds compared with  July.  He has jugular venous pressure of only 6-cm. There is no hepatojugular  reflux. There is no thyromegaly or lymphadenopathy.  Respiratory effort is normal.  LUNGS:  Have a few rales at both bases.  He has a nondisplaced point of  maximal cardiac impulse. There is regular  rate and rhythm with normal S1 and S2. There is no murmur. There is an  S4, but no S3.  ABDOMEN: Soft, nondistended and nontender. There is no hepatomegaly and  no pulsatile midline mass. Bowel sounds are normal.  EXTREMITIES: Are warm and without edema.  He is alert and oriented x3 with normal affect and normal neurologic  examination.   Electrocardiogram demonstrates normal sinus rhythm with old inferior  posterior myocardial infarction and minor nonspecific ST-T abnormality.  It is unchanged compared with the study of November 16, 2005.   IMPRESSIONS/RECOMMENDATIONS:  Mr. Mottern presents with symptoms  extremely suggestive of congestive heart failure. However, his exam is  not so suggestive. Perhaps this is because he has been taking Lasix on a  few days over the past couple of weeks and is thus partially treated.  Given his aggressive atherosclerotic disease, I am concerned that he may  have had progression of his coronary disease and this represents an  anginal equivalent. To further assess this, will check CMET, CBC, and  troponin today. Assuming the troponin is negative, will proceed to  echocardiogram and Adenosine Cardiolite, which I have arranged for  tomorrow morning.   As for treatment, I have asked him to begin taking Lasix 40 mg to be  taken this evening and then again in the morning and continued daily  thereafter. We will call the patient tomorrow with instructions as to  whether to begin potassium. We have gone ahead and provided him with a  prescription for 20 mEq of K-Dur so that he will have this available  should it be recommended.   Will arrange for him to see Dr. Riley Kill within the next week or two.     Salvadore Farber, MD  Electronically Signed    WED/MedQ  DD: 05/20/2006  DT: 05/20/2006  Job #: [id number]   cc:   Vear Clock. Riley Kill, MD, [facility], [place]

## 2010-10-03 NOTE — Assessment & Plan Note (Signed)
[facility]                            CARDIOLOGY OFFICE NOTE   NAME:Jonathan, ZOLTAN Arroyo                     MRN:          [id number]  DATE:08/05/2006                            DOB:          [birth year]    Jonathan Arroyo is in for a followup visit.  Overall, Jonathan Arroyo is  getting along reasonably well.  He continues to have some symptoms.  He  has undergone repeat coronary artery bypass graft surgery in 2005.  He  has continued to remain stable during that period of time.  He actually  does get up on the treadmill and is able to do quite a bit of activity  without any chest pain.  He does get dizzy and sometimes he does not  feel all that well.  Hydrochlorothiazide made him feel dizzy.  He does  have some shortness of breath.   The patient remains on aspirin 81 mg and Plavix 75 mg a day.  He is on  enalapril 10 b.i.d., simvastatin 40 daily, temazepam 15 mg p.r.n.,  Protonix 40 mg b.i.d., and hydrocodone p.r.n.   The patient did have myocardial perfusion imaging done in January of  this year.  There was an old inferior scar at the base with  questionable, perhaps mild, ischemia at the apex, and ejection fraction  of 40 percent.   Today, on exam, the blood pressure is 146/84.  The pulse is 55.  The  lung fields are clear.  The cardiac rhythm is generally regular.   The patient's electrocardiogram demonstrates sinus bradycardia with an  inferior posterior wall MI of indeterminate age.   Recent laboratory studies reveal a BUN of 17, creatinine 1, CO2 of 24,  and glucose of 89.   IMPRESSION:  1. Coronary artery bypass graft surgery with prior bypass x2.  2. Mild left ventricular function with an ejection fraction of 40      percent.  3. Some intermittent shortness of breath but fairly good exercise      tolerance without regular exercise program.  4. Systemic hypertension with borderline control.   PLAN:  1. Continue to follow up in cardiology  clinic.  2. He will need another basic metabolic profile in three to six      months.  3. We will see him in cardiology clinic within three months.     Arturo Morton. Riley Kill, MD, [facility]  Electronically Signed    TDS/MedQ  DD: 08/20/2006  DT: 08/20/2006  Job #: [id number]

## 2010-10-03 NOTE — Discharge Summary (Signed)
NAME:  Jonathan Arroyo, Jonathan Arroyo                        ACCOUNT NO.:  [id number]   MEDICAL RECORD NO.:  [id number]                   PATIENT TYPE:  INP   LOCATION:  5004                                 FACILITY:  [facility]   PHYSICIAN:  Loreta Ave, M.D.              DATE OF BIRTH:  Oct 27, 1929   DATE OF ADMISSION:  12/18/2002  DATE OF DISCHARGE:  12/22/2002                                 DISCHARGE SUMMARY   ADMISSION DIAGNOSIS:  Advanced degenerative joint disease of the right knee.   DISCHARGE DIAGNOSES:  1. Advanced degenerative joint disease of the right knee.  2. Hypertension.  3. Old myocardial infarction.  4. Esophageal reflux.  5. Aortocoronary bypass.   PROCEDURE:  Right total knee replacement.   HISTORY:  This is a 77 year old male with advanced degenerative joint  disease of the right knee.  He has had conservative treatment including  injections which are now refractory.  He is now having pain with activities  of daily living as well as with every step.  He is also having night time  pain.  Indicated now for total joint replacement.   HOSPITAL COURSE:  This is a 77 year old male admitted December 18, 2002, after  appropriate laboratory studies were obtained as well as one gram of Ancef IV  on call to the operating room.  He was taken to the operating room where he  underwent a right total knee replacement.  He tolerated the procedure well.  He was continued postoperative on Ancef 1 gram IV q.8h. X3 doses.  He was  placed on heparin 5000 units subcu q.12 hours until his Coumadin became  therapeutic.  A PCA pump of hydromorphone was used for pain.   He will continue with his home medicines.  PT/ OT and rehabilitation were  made.  Ambulation and weightbearing as tolerated on the right.  CPM 0-30  degrees for 8-10 hours a day, increasing 10 degrees per day.  A Foley was  placed intraoperatively.  His Foley was discontinued on the 5th of August.  He was allowed out of bed to  chair the following day.  He did have elevation  in his BUN and creatinine and decreased output.  This was corrected with IV  fluid boluses.  Because of confusion, his Percocet was discontinued, and he  was placed on Vicodin one tablet q.4h p.r.n. pain.   He otherwise had an unremarkable hospital course. A CT scan was done of his  chest to rule out a lesion that was picked up on chest x-ray.  He was  discharged on the 6th to return back to the office in 10-14 days for  followup.   EKG revealed marked sinus bradycardia with left axis deviation.  Radiographic studies, chest x-ray of December 18, 2002, revealed a density seen  at the right lung base on the prior exam of December 13, 2002, that does not  represent a  nipple shadow.  I suspect it probably represents an area of  scarring.  A follow up chest x-ray in three months or a limited CT without  contrast may be useful for further characterization.   Chest CT without contrast reveals posterior right lower lobe wedge-shape  bronchovascular air-space disease versus atelectasis.  Sub centimeter,  noncalcified left lower lobe pulmonary nodule, indeterminate.  Recommend  follow up to document stability in six months.  No definite lymphadenopathy.  Atherosclerotic changes post median sternotomy, probable left renal cyst.  Ultrasound could confirm this.   LABORATORY DATA:  He was admitted with a hemoglobin of 15.3, hematocrit  43.3%, white count 6200, platelets 208,000.  Discharge hemoglobin 8.8,  hematocrit 24.6%, white count 7,500, platelets 169,000.  Discharge pro-time  was 19.6 with an INR of 2.1.  Preoperative chemistries, sodium 140,  potassium 4.2, chloride 105, CO2 of 29, glucose 101, BUN 21, creatinine 1.3,  calcium 9.9.  Total protein 7.0, albumin 4.0, AST 16, ALT 14, ALP 68.  Total  bilirubin 0.8.  Discharge sodium 138, potassium 4.2, chloride 104, CO2 of  29, glucose 109.  BUN 18, creatinine 1.1, and calcium 8.9.   Urinalysis was  benign for void urine.  Blood type was A positive.  Antibody  screen negative.   DISCHARGE INSTRUCTIONS:  1. Given a prescription for Vicodin one to two tablets q.4-6h pain.  2. Coumadin 3 mg as directed by Genevieve Norlander.  3. Resume home medications.  4. CPM eight hours per day from 0 to 50 degrees, increasing by 5-10 degrees     a day.  5. Colace 100 mg one p.o. b.i.d.  6. Senokot two tablets with dinner.  7. Activity as taught in physical therapy.  8. No restriction on diet.  9. He will keep the wound clean and dry  10.      Call if he has increasing temperature, swelling, or pain.  11.      He will follow back up in the office in two weeks from surgery for     reevaluation.   DISCHARGE CONDITION:  Improved.      Oris Drone Petrarca, P.A.-C.                Loreta Ave, M.D.    BDP/MEDQ  D:  01/06/2003  T:  01/07/2003  Job:  [id number]

## 2010-10-03 NOTE — Cardiovascular Report (Signed)
[place]  Patient:    Jonathan Arroyo, Jonathan Arroyo                     MRN: [id number] Proc. Date: 11/03/99 Adm. Date:  [id number] Attending:  Talitha Givens                        Cardiac Catheterization  PROCEDURE PERFORMED:  Coronary arteriography.  INDICATION: Unstable angina.  LEFT MAIN CORONARY ARTERY:  Had an 80% distal discreet stenosis.  LEFT ANTERIOR DESCENDING ARTERY:  Artery was 100% occluded proximally.  CIRCUMFLEX CORONARY ARTERY:  Subtotally occluded in its mid portion. The vein graft to the three obtuse marginal branches filled retrograde.  RIGHT CORONARY ARTERY: Was 100% occluded proximally.  LEFT INTERNAL MAMMARY ARTERY:  Was widely patent to the mid LAD.  There was a 30% discreet lesion in the distal LAD.  Saphenous vein graft to obtuse marginal branches 1, 2 and 3 was widely patent with only a 30% body lesion. The ostium of the third obtuse marginal branch had a 50 to 60% discreet stenosis.  Saphenous vein graft to the right coronary artery was extremely tight. There was a 99% mid body lesion with TIMI II flow.  As we were about to perform left ventriculography, the patient had severe chest pain polymorphic VT and heart block. His EKG showed acute ST elevation.  The patient was resuscitated with volume.  Temporary pacemaker wire was placed through the right femoral vein, Integrilin and heparin were given, and Lidocaine were given.  Nitroglycerin was not given due to the patients blood pressure of 70 to 80 systolic.  IMPRESSION:  The patient has extremely tight vein graft to the right coronary artery which has occluded during diagnostic catheterization.  He has been stabilized with his acute ST changes and we will have Dr. Juanda Chance perform acute intervention on the vein graft to the right coronary artery.  So long as this goes well, the patient should do well as his vein graft to the obtuse marginal branch and LIMA are  widely patent. DD:  11/03/99 TD:  11/05/99 Job: [id number]

## 2010-10-03 NOTE — Discharge Summary (Signed)
[place]  Patient:    CHANZ, CAHALL Visit Number: [id number] MRN: [id number]          Service Type: MED Location: [id number] Attending Physician:  Ronaldo Miyamoto Dictated by:   Guy Franco, P.A.-C. Admit Date:  05/19/2001 Discharge Date: 05/21/2001                    Referring Physician Discharge [place]  DATE OF BIRTH:  23-Sep-1929  DISCHARGE DIAGNOSES: 1. Coronary artery disease, status post coronary artery bypass graft in 1994. 2. Chest discomfort, resolved. 3. Treated hyperlipoproteinemia. 4. Controlled hypertension. 5. Gastroesophageal reflux disease. 6. History of nephrolithiasis. 7. Spinal stenosis, status post surgery x 2.  HOSPITAL COURSE:  Mr. Reynolds Kittel was admitted on May 19, 2001, for a planned cardiac catheterization by Dr. Bonnee Quin.  He has a history of coronary artery disease and underwent CABG in 1994 with the following grafts: LIMA to the LAD, saphenous vein graft to the OM-distal circumflex, and saphenous vein graft to the right coronary artery.  In January 2001, he underwent stent placement to the saphenous vein graft-RCA bypass x 2 by Dr. Juanda Chance.  In March 2002, he underwent cardiac catheterization again, and a new lesion was found in the saphenous vein graft to the PDA.  This area was successfully stented.  With follow-up, the patient underwent another catheterization in June 2002, and underwent successful stenting of the vein graft to the circumflex and a PTCA of the distal portion of that graft.  LVEF normal.  Five days prior to his admission, he had episodes of chest discomfort and right shoulder pain; therefore, he was set up for a cardiac catheterization.  While in the hospital, he underwent the cardiac catheterization which was performed by Dr. Bonnee Quin, and he was found to have the following:  EF 63.5%.  The LIMA to the LAD was patent with saphenous vein graft to the  right coronary artery.  He had a 30% proximal lesion followed by a 30% distal lesion.  The saphenous vein graft to the circumflex revealed a distal 90% lesion.  At that time, the patient underwent PTA/stent of the saphenous vein graft to the obtuse marginal, reducing it from a 90% to a less than 10% lesion.  The patient was hospitalized overnight, remained stable, and was discharged to home.  LABORATORY STUDIES:  White 7.0, hemoglobin 13.7, hematocrit 37.7, platelets 173.  Sodium 140, potassium 4.2, BUN 14, creatinine 1.0.  Cardiac isoenzymes were negative.  DISCHARGE MEDICATIONS: 1. HCTZ 25 mg 1 p.o. q.d. 2. Lopid 600 mg p.o. b.i.d. 3. Plavix 75 mg q.d. 4. Enteric-coated aspirin 1 p.o. q.d. 5. Aciphex 20 mg 1 p.o. q.d. 6. Lisinopril 10 mg 1 p.o. q.d.  INSTRUCTIONS:  He is to maintain a low fat, low salt diet and clean his right groin incision with soap and water.  He is not to drive for 48 hours and then gradually increase his activity.  He is to call the office for a follow-up appointment with Dr. Riley Kill in the next several weeks. Dictated by:   Guy Franco, P.A.-C. Attending Physician:  Ronaldo Miyamoto DD:  06/03/01 TD:  06/03/01 Job: [id number]

## 2010-10-03 NOTE — Discharge Summary (Signed)
[place]. [facility]  Patient:    Jonathan Arroyo, Jonathan Arroyo                     MRN: [id number] Adm. Date:  [id number] Disc. Date: 11/05/99 Attending:  Talitha Givens Dictator:   Abelino Derrick, P.A.C. [facility] CC:         Arturo Morton. Riley Kill, M.D. [facility]                           Discharge Summary  DISCHARGE DIAGNOSES: 1. Coronary disease, status post saphenous vein graft to right coronary artery    stenting this admission. 2. Coronary artery bypass grafting in 1994. 3. Hyperlipidemia. 4. Sinus bradycardia, beta blocker on hold.  HOSPITAL COURSE:  The patient is a 77 year old male followed by Dr. Riley Kill and Dr. Everlene Other in [place] who has coronary disease.  He had bypass surgery in 1994 with an LIMA to LAD, SVG to RCA, SVG to OM1 and OM2 by Dr. Andrey Campanile.  He had catheterization in 1996 and grafts were patent.  He had catheterization in 1999 and had some distal disease with patent grafts.  He was seen in the office recently with recurrent chest pain and was set up for elective outpatient catheterization.  He was admitted by Dr. Myrtis Ser on November 01, 1999 when he had recurrent pain at home.  His EKG on admission showed no acute changes. He was admitted to telemetry started on Lovenox, set up for catheterization. The patient was catheterized by Dr. Eden Emms on November 03, 1999. He had a 99% occlusion of the SVG to RCA which slow flow.  The patient developed substernal chest pain, complete heart block and polymorphic VT and ST elevation when Dr. Eden Emms prepared to inject for left ventriculogram.  He was treated with IV fluids, temporary pacer, Integrilin and Lidocaine.  Dr. Juanda Chance opened the SVG to RCA with stents to two sites.  The patient tolerated this well.  He was kept for an extra 24 hours. His heart rate maintained somewhat slow and his beta blocker was held until he sees Dr. Riley Kill in follow up.  Dr. Eden Emms feels he can be discharged on November 05, 1999.  DISCHARGE  MEDICATIONS:  Hydrochlorothiazide 25 mg a day.  Zantac 150 h.s. Lopid 600 mg b.i.d.  Coated aspirin q.d. Plavix 75 mg a day for four weeks. Nitroglycerin sublingual p.r.n.  LABORATORY DATA:  CK MB and troponins are negative.  Platelet count is 187. Sodium 137, potassium 3.5, BUN 15, creatinine 1.2.  White count 7.7, hemoglobin 12.8, hematocrit 36.6, platelets 185.  EKG shows normal sinus rhythm and sinus bradycardia.  DISPOSITION:  The patient was discharged in stable condition.  Will follow up with Dian Queen, P. A. on November 20, 1999.  He has an appointment to see Dr. Riley Kill in August and he will keep this. DD:  11/05/99 TD:  11/05/99 Job: [id number]

## 2010-10-03 NOTE — Discharge Summary (Signed)
NAME:  Jonathan Arroyo, Jonathan Arroyo                        ACCOUNT NO.:  [id number]   MEDICAL RECORD NO.:  [id number]                   PATIENT TYPE:  OIB   LOCATION:  3711                                 FACILITY:  [facility]   PHYSICIAN:  Arturo Morton. Riley Kill, M.D.             DATE OF BIRTH:  Oct 20, 1929   DATE OF ADMISSION:  06/27/2003  DATE OF DISCHARGE:  06/28/2003                                 DISCHARGE SUMMARY   PROCEDURES:  1. Cardiac catheterization.  2. Coronary arteriogram.  3. Left ventriculogram.  4. Graft angiogram.  5. Left internal mammary artery arteriogram.   HOSPITAL COURSE:  Jonathan Arroyo is a 77 year old male patient of Dr. Riley Kill  who was evaluated in the office on June 12, 2003, for dyspnea on  exertion.  There was concern for his symptoms being an anginal equivalent,  and he was scheduled for catheterization and admitted for this on June 27, 2003.   The cardiac catheterization showed severe native three-vessel disease with  significant stenosis in the SVG to PDA and SVG to OM.  There was also distal  LAD disease of 80%.  His EF was normal.  Dr. Riley Kill felt that the patient  should be seen by the surgeons to evaluate options.   Jonathan Arroyo was seen by Dr. Donata Clay who felt that, due to severe left  main native disease and severe diffuse disease of both saphenous vein grafts  now, redo CABG after Plavix is off for a week is the best option.  This  situation was discussed with the patient, and it was felt that he was stable  to go home on leave of absence and return for bypass surgery on July 04, 2003.   Post catheterization, Jonathan Arroyo was ambulating without chest pain or  shortness of breath.  His laboratory values were stable.  Jonathan Arroyo was  considered stable for discharge on June 28, 2003, and is to return for  redo bypass surgery on July 04, 2003.   LABORATORY DATA:  Hemoglobin 12.6, hematocrit 35.8, WBC 5.2, platelets 202.  Sodium 139,  potassium 3.6, chloride 108, CO2 26, BUN 14, creatinine 1.2,  glucose 103.   CONDITION ON DISCHARGE:  Stable.   DISCHARGE DIAGNOSES:  1. Coronary artery disease, for redo bypass surgery on July 04, 2003.  2. Status post aortocoronary bypass surgery with left internal mammary     artery to left anterior descending , saphenous vein graft to posterior     descending artery, and saphenous vein graft obtuse marginal.  3. Hypertension.  4. Dyslipidemia.  5. History of spinal stenosis.  6. Intolerance to PERCOCET.  7. Allergy to MAGNESIUM/ALUMINUM OH.   DISCHARGE INSTRUCTIONS:  1. His activity level is to include no strenuous activity.  2. He is to stick to a low-fat and low-salt diet.  3. He is to call our office for any problems with the catheterization site.  4. He  is to return for surgery on July 04, 2003.   DISCHARGE MEDICATIONS:  1. Aspirin 325 mg daily.  2. Tenormin 25 mg 1/2 tablet daily.  3. Neurontin 400 mg q.h.s.  4. Lopid 600 mg b.i.d.  5. Hydrocodone p.r.n.  6. GI prophylaxis as prior to admission.      Jonathan Arroyo, P.A. [facility]                  Thomas D. Riley Kill, M.D.    RB/MEDQ  D:  06/28/2003  T:  06/28/2003  Job:  [id number]   cc:   Nadine Counts, M.D.

## 2010-10-03 NOTE — Op Note (Signed)
NAME:  Jonathan Arroyo, Jonathan Arroyo                        ACCOUNT NO.:  [id number]   MEDICAL RECORD NO.:  [id number]                   PATIENT TYPE:  INP   LOCATION:  2315                                 FACILITY:  [facility]   PHYSICIAN:  Sheldon Silvan, M.D.                   DATE OF BIRTH:  June 21, 1929   DATE OF PROCEDURE:  07/04/2003  DATE OF DISCHARGE:                                 OPERATIVE REPORT   PROCEDURE PERFORMED:  Intraoperative transesophageal echocardiography (TEE).   DESCRIPTION OF PROCEDURE:  Mr. Streater was brought to the operating room by  Kerin Perna, M.D. for coronary artery bypass grafting.  He had had this  previously performed by Ines Bloomer, M.D. 10 years ago.  He was  anesthetized after appropriate monitoring lines were introduced.  Endotracheal intubation was accomplished without difficulty.   The operation went well and the patient was subsequently weaned from bypass.  At a point just after closing the chest began, the patient had elevated ST  segments and it was felt that a diagnostic TEE intervention would be  appropriate for him.  The [employer]ane TEE was lubricated and  passed  through the oropharynx to the esophagus without difficulty.  The  heart was imaged adequately.   The left ventricle was imaged at the midventricle level in the short axis  and seen to be contracting well except in one inferior portion which was  somewhat hypokinetic compared to the rest of the ventricle.   The mitral valve was examined and found to be anatomically and structurally  sound in terms of apposition of the leaflets.  The cardia were intact.  There was 1 to 2+ regurgitation noted.   The aortic valve was imaged and there were three cusps seen without  significant amounts of sclerosis.  There was no regurgitation noted on Color  Flow exam.  The interatrial septum was examined and there was no PFO noted  on Color Flow exam.   The right ventricle was examined and  the tricuspid valve was functioning  normally.  The right ventricle was also noted to be contracting well.  There  was no evidence of pericardial effusion.  Left atrial appendage was not seen  easily.  At this point, further agents were introduced to help with the  patient's continued weaning from bypass and he subsequently did well.  It  was felt that the ventricle was stable and would remain in its condition as  seen.  There was no indication to return to bypass.   Continued closure of the chest was accomplished.  Prior to removing from the  operating room, the TEE probe was removed atraumatically.  The patient was  transported to the SICU in good condition.  Sheldon Silvan, M.D.    DC/MEDQ  D:  07/05/2003  T:  07/06/2003  Job:  [id number]

## 2010-10-03 NOTE — Discharge Summary (Signed)
[place]. [facility]  Patient:    Jonathan Arroyo, Jonathan Arroyo                     MRN: [id number] Adm. Date:  [id number] Disc. Date: [id number] Attending:  Ronaldo Miyamoto Dictator:   Delton See, P.A. CC:         Nadine Counts, M.D.   Discharge Summary  DATE OF BIRTH:  1929-09-12  HISTORY OF PRESENT ILLNESS:  This is a pleasant 77 year old male with a previous history of coronary artery bypass grafting surgery in 1994, and urgent stents x 2 to the vein graft to the right coronary artery in June 2001. After that the patient did quite well until a few weeks prior to this admission, when he developed anginal symptoms.  He was admitted for further evaluation.  PAST MEDICAL HISTORY: 1. As noted, the patient had a coronary artery bypass grafting surgery    in 1994, performed by Dr. Algis Downs. Karle Plumber, with a LIMA to the LAD,    saphenous vein graft to the OM, distal circumflex, and saphenous vein    graft to the RCA. 2. The patient has a history of spinal stenosis.  He has had surgery x 2. 3. History of renal calculi.  ALLERGIES:  MYLANTA causes tongue swelling.  SOCIAL HISTORY:  The patient is married.  He does not use alcohol or tobacco.  HOSPITAL COURSE:  As noted, this patient was admitted to [facility] on July 23, 2000, for further evaluation of chest pain with known coronary artery disease.  He underwent a cardiac catheterization on the day of admission, performed by Dr. Arturo Morton. Stuckey.  The patient had some mild inferior hypokinesis with an ejection fraction of 50%.  There was a patent LIMA to the LAD.  There was a new lesion in the saphenous vein graft to the PDA, which was 99%.  Please see Dr. Louretta Shorten dictated report for the full details of the cardiac catheterization.  It was felt that a redo coronary artery bypass grafting surgery might need to be considered.  The patient was seen by Dr. Kathlee Nations Trigt III  in consultation on July 23, 2000, and a PTCA and stenting was recommended for the saphenous vein graft lesion to the RCA.  On July 26, 2000, the patient underwent a PTCA and stenting of the RCA graft. The lesion was reduced from 90% to 10%.  The patient tolerated this well. DISPOSITION:  Arrangements were made to discharge the patient on the following day.  Prior to discharge his potassium was noted to be low at 3.3.  He was given K-Dur 40 mEq prior to discharge, and placed on a potassium supplement of 10 mEq q.d. with followup recommended at his next office visit.  LABORATORY DATA:  A CBC on the day of discharge was within normal limits.  A basic metabolic panel was within normal limits except for the potassium level of 3.3.  BUN 16, creatinine 1.1.  Cardiac enzymes were negative.  An electrocardiogram showed sinus bradycardia, rate of 53 beats per minute, with left axis deviation.  DISCHARGE MEDICATIONS: 1. Lopid 600 mg b.i.d. 2. Hydrochlorothiazide 25 mg q.d. 3. Zantac 150 mg b.i.d. 4. Atenolol 50 mg q.d. 5. Plavix 75 mg q.d. for four weeks. 6. Enteric-coated aspirin 325 mg q.d. 7. Nitroglycerin under the tongue as needed for chest pain. 8. K-Dur 10 mEq q.d.  INSTRUCTIONS:  The patient is told to avoid any strenuous  activity or driving for two days.  He is told to call the office if he has any increased pain, swelling, or bleeding from his groin.  DIET:  He is to be on a low-salt, low-fat diet.  FOLLOWUP:  He is to have a potassium level at his next office visit.  He is to see Dr. Riley Kill in one week on Tuesday, August 03, 2000, at 10 a.m.  He is to see Dr. Nadine Counts as needed or as scheduled.  DISCHARGE DIAGNOSES: 1. Status post percutaneous transluminal coronary angioplasty and    stenting of the saphenous vein graft to the right coronary artery,    performed on July 26, 2000. 2. An ejection fraction of 50% at the time of the cardiac catheterization. 3. Coronary  artery bypass grafting surgery performed in 1994.  Please see    the details as noted above. 4. Status post stent to the saphenous vein graft to the right coronary    artery in June 2001. 5. Elevated lipids. 6. Hypertension. 7. Hypokalemia, supplemented. DD:  07/27/00 TD:  07/27/00 Job: [id number]

## 2010-10-03 NOTE — Cardiovascular Report (Signed)
[place]. [facility]  Patient:    Jonathan Arroyo, Jonathan Arroyo                     MRN: [id number] Proc. Date: 07/23/00 Adm. Date:  [id number] Attending:  Ronaldo Arroyo CC:         Jonathan Arroyo Other, M.D.  Jonathan Arroyo  Jonathan Arroyo, P.A.C. [facility]   Cardiac Catheterization  INDICATIONS:  Jonathan Arroyo is a delightful 77 year old well known to me.  He had previous revascularization surgery in 1994 by Jonathan Arroyo, M.D.  He now presents with recurrent angina.  In the past year, he developed unstable coronary syndrome and had stenting of the mid right graft.  The current study was done to assess for recurrent angina.  PROCEDURE: 1. Left heart catheterization. 2. Selective coronary arteriography. 3. Selective left ventriculography. 4. Saphenuos vein graft angiography x 2. 5. Selective left internal mammary angiography.  DESCRIPTION OF PROCEDURE:  The procedure was performed from the right femoral artery using 6 French catheters.  He tolerated the procedure without complications.  HEMODYNAMIC DATA:  The central aortic pressure was 124/66, LV pressure 135/16. There was no gradient on pullback across the aortic valve.  ANGIOGRAPHIC DATA:  On plane fluoroscopy, there was evidence of extensive calcification of the coronaries.  The left main coronary artery demonstrates a 95% distal stenosis.  The left anterior descending artery is totally occluded beyond a small first septal perforator with faint filling of a second septal perforator through septal to septal collaterals.  The left internal mammary artery to the distal LAD is widely patent.  The native circumflex demonstrates 30% proximal narrowing.  This leads into a marginal branch and then a second marginal branch which has about an 80% proximal area.  The AV circumflex distally has 99% narrowing.  The sequential saphenuos vein graft to two obtuse marginal branches demonstrates about a 40% area of proximal  narrowing.  It then inserts into a marginal branch.  It also inserts distally into a marginal branch which fills the distal circumflex system in a retrograde fashion.  At the insertion site is a hazy area of about 80% narrowing.  The right coronary artery is totally occluded.  The saphenuos vein graft to the distal PDA demonstrates no evidence of significant restenosis at the previous stent sites.  There is about 30% narrowing at most due to healing.  At the more distally there is a subtotal occlusion of 99% leading into the PDA.  This then fills the posterolateral system.  Ventriculography in the RAO projection reveals preserved overall systolic function because of a lot of ventricular ectope.  Ejection fraction was not calculated, however, it appeared in excess of 50%.  There was perhaps slight inferior hypokinesis.  CONCLUSION: 1. Preserved overall left ventricular function. 2. Continued patency of the left internal mammary artery to the left anterior    descending. 3. New high grade stenosis in the vein graft to the distal right coronary    artery. 4. Progressive disease of the sequential circumflex graft.  DISPOSITION:  Because of the patients young age and his overall good health, I am leaning towards the likelihood of redo revascularization surgery.  His internal mammary is intact.  But the remainder of the anatomy of his right coronary and circumflex territories are all in jeopardy.  Because of the age of the vein grafts, the likelihood of longterm graft patency over two to four year period is relatively low.  I will get a  surgical consult to assess this possibility. DD:  07/23/00 TD:  07/24/00 Job: [id number]

## 2010-11-14 ENCOUNTER — Telehealth: Payer: Self-pay | Admitting: Cardiology

## 2010-11-14 NOTE — Telephone Encounter (Signed)
Patient states was in the [place] hospital today. The nurse there asked pt if he had heart problems after listen to his heart. Patient  Jonathan Arroyo   has a history of heart skipping beats. Pt. would like to make a 6 months F/U visit with Dr. Riley Kill. I let pt. Know MD has an opening on July 30 th. Patient is out of town that week. He will call next week to see what he can do.

## 2010-11-14 NOTE — Telephone Encounter (Signed)
Per pt calling general question .

## 2010-12-09 ENCOUNTER — Encounter: Payer: Self-pay | Admitting: Cardiology

## 2010-12-10 ENCOUNTER — Encounter: Payer: Self-pay | Admitting: Cardiology

## 2010-12-10 ENCOUNTER — Ambulatory Visit (INDEPENDENT_AMBULATORY_CARE_PROVIDER_SITE_OTHER): Payer: Medicare Other | Admitting: Cardiology

## 2010-12-10 DIAGNOSIS — I2510 Atherosclerotic heart disease of native coronary artery without angina pectoris: Secondary | ICD-10-CM

## 2010-12-10 DIAGNOSIS — E785 Hyperlipidemia, unspecified: Secondary | ICD-10-CM

## 2010-12-10 DIAGNOSIS — I10 Essential (primary) hypertension: Secondary | ICD-10-CM

## 2010-12-10 DIAGNOSIS — R002 Palpitations: Secondary | ICD-10-CM

## 2010-12-10 DIAGNOSIS — M629 Disorder of muscle, unspecified: Secondary | ICD-10-CM

## 2010-12-10 NOTE — Patient Instructions (Signed)
Your physician has recommended that you wear an event monitor. Event monitors are medical devices that record the heart's electrical activity. Doctors most often [place] these monitors to diagnose arrhythmias. Arrhythmias are problems with the speed or rhythm of the heartbeat. The monitor is a small, portable device. You can wear one while you do your normal daily activities. This is usually used to diagnose what is causing palpitations/syncope (passing out). 30 day event monitor  Your physician recommends that you schedule a follow-up appointment in: 6 weeks with Dr. Riley Kill

## 2010-12-12 ENCOUNTER — Telehealth: Payer: Self-pay

## 2010-12-16 ENCOUNTER — Telehealth: Payer: Self-pay

## 2010-12-16 NOTE — Telephone Encounter (Signed)
Patient will have monitor done  On 12/18/10

## 2010-12-16 NOTE — Telephone Encounter (Signed)
Patient will have monitor done on 12/18/10 2 12:15 pm

## 2010-12-18 ENCOUNTER — Encounter (INDEPENDENT_AMBULATORY_CARE_PROVIDER_SITE_OTHER): Payer: Medicare Other

## 2010-12-18 DIAGNOSIS — R002 Palpitations: Secondary | ICD-10-CM

## 2010-12-18 DIAGNOSIS — I714 Abdominal aortic aneurysm, without rupture: Secondary | ICD-10-CM

## 2010-12-24 ENCOUNTER — Telehealth: Payer: Self-pay | Admitting: Cardiology

## 2010-12-24 DIAGNOSIS — I10 Essential (primary) hypertension: Secondary | ICD-10-CM

## 2010-12-24 MED ORDER — CLONIDINE HCL 0.1 MG PO TABS: 0.1000 mg | ORAL_TABLET | Freq: Three times a day (TID) | ORAL | Status: DC

## 2010-12-24 MED ORDER — LISINOPRIL 20 MG PO TABS: 20.0000 mg | ORAL_TABLET | Freq: Every day | ORAL | Status: DC

## 2010-12-24 NOTE — Telephone Encounter (Signed)
Per pt wife calling, pt has been having some problems pt wife would like to discuss with the nurse and/or Dr. Riley Kill. Please release pt to advise/discuss.   BP went up to 200/165 yesterday afternoon.

## 2010-12-24 NOTE — Telephone Encounter (Signed)
Patient saw Dr. Shary Decamp recently for HTN. His BP has been running Systolic 190-200's over 90-100's. BP today 112/60. Dr. Shary Decamp increased his Clonidine to 0.1 mg TID. He is also taking Lisinopril 20 mg daily. His BP is fluctuating. He also stated that Dr. Shary Decamp listened to his heart and said that he was in atrial fib. He is currently wearing an event monitor. Will discuss with Dr. Riley Kill.  Spoke with Dr. Riley Kill, he does not wish to make any changes currently with his BP medications. Will check with Windell Moulding to see if he has had any episodes of atrial fibrillation.

## 2010-12-30 NOTE — Procedures (Unsigned)
DUPLEX ULTRASOUND OF ABDOMINAL AORTA  INDICATION:  Follow up abdominal aortic aneurysm.  HISTORY: Diabetes:  No. Cardiac:  CABG; CHF. Hypertension:  Yes. Smoking:  No. Connective Tissue Disorder: Family History:  No. Previous Surgery:  No.  DUPLEX EXAM:         AP (cm)                   TRANSVERSE (cm) Proximal             2.01 cm                   2.08 cm Mid                  4.46 cm                   4.62 cm Distal               2.21 cm                   2.43 cm Right Iliac          1.10 cm                   1.18 cm Left Iliac           1.03 cm                   1.05 cm  PREVIOUS:  Date: 02/13/2010  AP:  4.22  TRANSVERSE:  4.35  IMPRESSION: 1. Infrarenal abdominal aortic aneurysm without intramural thrombus     present, measuring 4.46 cm X 4.62 cm. 2. The remainder of the abdominal aorta and iliac arteries appear     normal and without dilatation. 3. Slight increase in abdominal aortic aneurysm since the previous     study on 02/13/2010.  ___________________________________________ [name], MD  SH/MEDQ  D:  12/18/2010  T:  12/18/2010  Job:  [id number]

## 2011-01-09 DIAGNOSIS — R002 Palpitations: Secondary | ICD-10-CM | POA: Insufficient documentation

## 2011-01-09 NOTE — Progress Notes (Signed)
HPI:  Stable since I last saw him.  His biggest issue right now is a neuropathy, and he is seeing Dr.Willis for this.  Thinks he had one episode of a rapid rate since last visit.  Current Outpatient Prescriptions  Medication Sig Dispense Refill  . clopidogrel (PLAVIX) 75 MG tablet Take 75 mg by mouth daily.        . finasteride (PROSCAR) 5 MG tablet Take 5 mg by mouth daily.        . furosemide (LASIX) 40 MG tablet Take 40 mg by mouth as needed.        Marland Kitchen HYDROcodone-acetaminophen (VICODIN) 5-500 MG per tablet Take 1 tablet by mouth every 6 (six) hours as needed.        . nitroGLYCERIN (NITROSTAT) 0.4 MG SL tablet Place 0.4 mg under the tongue every 5 (five) minutes as needed.        . pantoprazole (PROTONIX) 40 MG tablet Take 40 mg by mouth 2 (two) times daily.        . Polyethylene Glycol 8000 POWD Take by mouth 4 (four) times a week.        . simvastatin (ZOCOR) 40 MG tablet Take 40 mg by mouth at bedtime.        Marland Kitchen zolpidem (AMBIEN) 10 MG tablet Take 10 mg by mouth at bedtime as needed.        . cloNIDine (CATAPRES) 0.1 MG tablet Take 1 tablet (0.1 mg total) by mouth 3 (three) times daily.  90 tablet  0  . lisinopril (PRINIVIL,ZESTRIL) 20 MG tablet Take 1 tablet (20 mg total) by mouth daily.  30 tablet  0    Allergies  Allergen Reactions  . Mylanta   . Oxycodone-Acetaminophen     Past Medical History  Diagnosis Date  . Other and unspecified hyperlipidemia   . Renal calculus     history  . Other specified gastritis without mention of hemorrhage   . Spinal stenosis, unspecified region other than cervical   . Hiatal hernia     HISTORY  . Acute venous embolism and thrombosis of unspecified deep vessels of lower extremity   . Abdominal aneurysm without mention of rupture   . Esophageal reflux   . Unspecified essential hypertension   . Coronary atherosclerosis of unspecified type of vessel, native or graft     Past Surgical History  Procedure Date  . Ptca   . Total knee  arthroplasty     right  . Coronary artery bypass graft     Family History  Problem Relation Age of Onset  . Coronary artery disease Mother     History   Social History  . Marital Status: Married    Spouse Name: N/A    Number of Children: N/A  . Years of Education: N/A   Occupational History  . Not on file.   Social History Main Topics  . Smoking status: Never Smoker   . Smokeless tobacco: Never Used  . Alcohol Use: No  . Drug Use: No  . Sexually Active: Not on file   Other Topics Concern  . Not on file   Social History Narrative  . No narrative on file    ROS: Please see the HPI.  All other systems reviewed and negative.  PHYSICAL EXAM:  BP 107/67  Pulse 79  Ht 6\' 2"  (1.88 m)  Wt 186 lb (84.369 kg)  BMI 23.88 kg/m2  General: Well developed, well nourished, in no acute distress. Head:  Normocephalic and atraumatic. Neck: no JVD Lungs: Clear to auscultation and percussion. Heart: Normal S1 and S2.  Soft apical murmur  (1/6) Abdomen:  Normal bowel sounds; soft; non tender; no organomegaly Pulses: Pulses normal in all 4 extremities. Extremities: No clubbing or cyanosis. No edema. Neurologic: Alert and oriented x 3.  EKG:  NSR with PACs.  Old inferior MI, age indeterminate.   ASSESSMENT AND PLAN:

## 2011-01-09 NOTE — Assessment & Plan Note (Signed)
Followed by his primary MD.   

## 2011-01-09 NOTE — Assessment & Plan Note (Signed)
Think it would be appropriate to consider an event monitor.  He may have had an episode of atrial fibrillation.  Will arrange and see back in six weeks.  He would likely need warfarin if this was documented.

## 2011-01-09 NOTE — Assessment & Plan Note (Signed)
See echo one year ago.

## 2011-01-09 NOTE — Assessment & Plan Note (Signed)
No chest pain at present.  Continue on current medications, which include plavix.

## 2011-01-09 NOTE — Assessment & Plan Note (Signed)
BP is up and down, but currently it is controlled. Continue on medications at the present time.

## 2011-01-21 ENCOUNTER — Ambulatory Visit (INDEPENDENT_AMBULATORY_CARE_PROVIDER_SITE_OTHER): Payer: Medicare Other | Admitting: Cardiology

## 2011-01-21 ENCOUNTER — Encounter: Payer: Self-pay | Admitting: Cardiology

## 2011-01-21 VITALS — BP 160/90 | HR 62 | Ht 73.0 in | Wt 189.8 lb

## 2011-01-21 DIAGNOSIS — I714 Abdominal aortic aneurysm, without rupture: Secondary | ICD-10-CM

## 2011-01-21 DIAGNOSIS — I10 Essential (primary) hypertension: Secondary | ICD-10-CM

## 2011-01-21 DIAGNOSIS — I2510 Atherosclerotic heart disease of native coronary artery without angina pectoris: Secondary | ICD-10-CM

## 2011-01-21 NOTE — Patient Instructions (Signed)
Return to see Dr.Stuckey in 2 months.

## 2011-01-25 NOTE — Progress Notes (Signed)
HPI:  He is back in for follow up.  His monitor was reviewed.  It was mostly NSR with PACS, short bursts, mostly small bits of SVT, but short af cannot be totally excluded.  There was nothing sustained.  We discussed at length.    Current Outpatient Prescriptions  Medication Sig Dispense Refill  . cloNIDine (CATAPRES) 0.1 MG tablet Take 1 tablet (0.1 mg total) by mouth 3 (three) times daily.  90 tablet  0  . clopidogrel (PLAVIX) 75 MG tablet Take 75 mg by mouth daily.        . finasteride (PROSCAR) 5 MG tablet Take 5 mg by mouth daily.        . furosemide (LASIX) 40 MG tablet Take 40 mg by mouth as needed.        Marland Kitchen HYDROcodone-acetaminophen (VICODIN) 5-500 MG per tablet Take 1 tablet by mouth every 6 (six) hours as needed.        Marland Kitchen lisinopril (PRINIVIL,ZESTRIL) 20 MG tablet Take 1 tablet (20 mg total) by mouth daily.  30 tablet  0  . nitroGLYCERIN (NITROSTAT) 0.4 MG SL tablet Place 0.4 mg under the tongue every 5 (five) minutes as needed.        . pantoprazole (PROTONIX) 40 MG tablet Take 40 mg by mouth 2 (two) times daily.        . Polyethylene Glycol 8000 POWD Take by mouth 4 (four) times a week.        . simvastatin (ZOCOR) 40 MG tablet Take 40 mg by mouth at bedtime.        Marland Kitchen zolpidem (AMBIEN) 10 MG tablet Take 10 mg by mouth at bedtime as needed.          Allergies  Allergen Reactions  . Mylanta   . Oxycodone-Acetaminophen     Past Medical History  Diagnosis Date  . Other and unspecified hyperlipidemia   . Renal calculus     history  . Other specified gastritis without mention of hemorrhage   . Spinal stenosis, unspecified region other than cervical   . Hiatal hernia     HISTORY  . Acute venous embolism and thrombosis of unspecified deep vessels of lower extremity   . Abdominal aneurysm without mention of rupture   . Esophageal reflux   . Unspecified essential hypertension   . Coronary atherosclerosis of unspecified type of vessel, native or graft     Past Surgical History   Procedure Date  . Ptca   . Total knee arthroplasty     right  . Coronary artery bypass graft     Family History  Problem Relation Age of Onset  . Coronary artery disease Mother     History   Social History  . Marital Status: Married    Spouse Name: N/A    Number of Children: N/A  . Years of Education: N/A   Occupational History  . Not on file.   Social History Main Topics  . Smoking status: Never Smoker   . Smokeless tobacco: Never Used  . Alcohol Use: No  . Drug Use: No  . Sexually Active: Not on file   Other Topics Concern  . Not on file   Social History Narrative  . No narrative on file    ROS: Please see the HPI.  All other systems reviewed and negative.  PHYSICAL EXAM:  BP 160/90  Pulse 62  Ht 6\' 1"  (1.854 m)  Wt 189 lb 12.8 oz (86.093 kg)  BMI 25.04 kg/m2  General: Well developed, well nourished, in no acute distress. Head:  Normocephalic and atraumatic. Neck: no JVD Lungs: Clear to auscultation and percussion. Heart: Normal S1 and S2.  No murmur, rubs or gallops.  Abdomen:  Normal bowel sounds; soft; non tender; no organomegaly Pulses: Pulses normal in all 4 extremities. Extremities: No clubbing or cyanosis. No edema. Neurologic: Alert and oriented x 3.  EKG:  SR.  PACs.  Left axis, otherwise normal.   ASSESSMENT AND PLAN:

## 2011-02-11 LAB — BASIC METABOLIC PANEL
BUN: 6
CO2: 30
Calcium: 9.3
Chloride: 102
Creatinine, Ser: 0.84
GFR calc Af Amer: >60
GFR calc non Af Amer: >60
Glucose, Bld: 80
Potassium: 4.2
Sodium: 137

## 2011-02-11 LAB — POCT HEMOGLOBIN-HEMACUE: Hemoglobin: 14.3

## 2011-02-18 NOTE — Assessment & Plan Note (Signed)
Being followed at [facility]--Fields

## 2011-02-18 NOTE — Assessment & Plan Note (Addendum)
BP continues to fluctuate, so we will need to continue to follow.  He checks frequently.

## 2011-02-18 NOTE — Assessment & Plan Note (Signed)
No real change in status at this point in time.  No progression of symtpoms

## 2011-04-07 ENCOUNTER — Encounter: Payer: Self-pay | Admitting: Cardiology

## 2011-04-07 ENCOUNTER — Ambulatory Visit (INDEPENDENT_AMBULATORY_CARE_PROVIDER_SITE_OTHER): Payer: Medicare Other | Admitting: Cardiology

## 2011-04-07 VITALS — BP 138/90 | HR 56 | Resp 18 | Ht 72.0 in | Wt 190.0 lb

## 2011-04-07 DIAGNOSIS — I714 Abdominal aortic aneurysm, without rupture: Secondary | ICD-10-CM

## 2011-04-07 DIAGNOSIS — E785 Hyperlipidemia, unspecified: Secondary | ICD-10-CM

## 2011-04-07 DIAGNOSIS — R002 Palpitations: Secondary | ICD-10-CM

## 2011-04-07 DIAGNOSIS — I2510 Atherosclerotic heart disease of native coronary artery without angina pectoris: Secondary | ICD-10-CM

## 2011-04-07 DIAGNOSIS — I10 Essential (primary) hypertension: Secondary | ICD-10-CM

## 2011-04-07 NOTE — Patient Instructions (Signed)
Your physician recommends that you schedule a follow-up appointment in: 3 months.  

## 2011-05-12 NOTE — Assessment & Plan Note (Signed)
Labs done by his primary MD.   

## 2011-05-12 NOTE — Progress Notes (Signed)
HPI:  He is currently stable.  No chest pain of significance.  No real change in occasional dizziness.  Some fluctuation in BP at times.  Gets excited about politics.    Current Outpatient Prescriptions  Medication Sig Dispense Refill  . cloNIDine (CATAPRES) 0.1 MG tablet Take 1 tablet (0.1 mg total) by mouth 3 (three) times daily.  90 tablet  0  . clopidogrel (PLAVIX) 75 MG tablet Take 75 mg by mouth daily.        . finasteride (PROSCAR) 5 MG tablet Take 5 mg by mouth daily.        . furosemide (LASIX) 40 MG tablet Take 40 mg by mouth as needed.        Marland Kitchen HYDROcodone-acetaminophen (VICODIN) 5-500 MG per tablet Take 1 tablet by mouth every 6 (six) hours as needed.        Marland Kitchen lisinopril (PRINIVIL,ZESTRIL) 20 MG tablet Take 20 mg by mouth 2 (two) times daily.        . nitroGLYCERIN (NITROSTAT) 0.4 MG SL tablet Place 0.4 mg under the tongue every 5 (five) minutes as needed.        . pantoprazole (PROTONIX) 40 MG tablet Take 40 mg by mouth daily.       . Polyethylene Glycol 8000 POWD Take by mouth 4 (four) times a week.       . simvastatin (ZOCOR) 40 MG tablet Take 40 mg by mouth at bedtime.        Marland Kitchen zolpidem (AMBIEN) 10 MG tablet Take 10 mg by mouth at bedtime as needed.          Allergies  Allergen Reactions  . Mylanta   . Oxycodone-Acetaminophen     Past Medical History  Diagnosis Date  . Other and unspecified hyperlipidemia   . Renal calculus     history  . Other specified gastritis without mention of hemorrhage   . Spinal stenosis, unspecified region other than cervical   . Hiatal hernia     HISTORY  . Acute venous embolism and thrombosis of unspecified deep vessels of lower extremity   . Abdominal aneurysm without mention of rupture   . Esophageal reflux   . Unspecified essential hypertension   . Coronary atherosclerosis of unspecified type of vessel, native or graft     Past Surgical History  Procedure Date  . Ptca   . Total knee arthroplasty     right  . Coronary  artery bypass graft     Family History  Problem Relation Age of Onset  . Coronary artery disease Mother     History   Social History  . Marital Status: Married    Spouse Name: N/A    Number of Children: N/A  . Years of Education: N/A   Occupational History  . Not on file.   Social History Main Topics  . Smoking status: Never Smoker   . Smokeless tobacco: Never Used  . Alcohol Use: No  . Drug Use: No  . Sexually Active: Not on file   Other Topics Concern  . Not on file   Social History Narrative  . No narrative on file    ROS: Please see the HPI.  All other systems reviewed and negative.  PHYSICAL EXAM:  BP 138/90  Pulse 56  Resp 18  Ht 6' (1.829 m)  Wt 86.183 kg (190 lb)  BMI 25.77 kg/m2  General: Well developed, well nourished, in no acute distress. Head:  Normocephalic and atraumatic. Neck: no  JVD Lungs: Clear to auscultation and percussion. Heart: Normal S1 and S2.  No murmur, rubs or gallops.  Abdomen:  Normal bowel sounds; soft; non tender; no organomegaly Pulses: Pulses normal in all 4 extremities. Extremities: No clubbing or cyanosis. No edema. Neurologic: Alert and oriented x 3.  EKG:  SB with marked sinus arrhythmia.  Nonspecific T abnormality.  Cannot exclude primary inferior MI.    ASSESSMENT AND PLAN:

## 2011-05-12 NOTE — Assessment & Plan Note (Signed)
Not too far out of control.  Clearly labile at times.  No recurrent issues.

## 2011-05-12 NOTE — Assessment & Plan Note (Signed)
These are recurrent, but stable.  Not currently on beta blockers.

## 2011-05-12 NOTE — Assessment & Plan Note (Signed)
Continuing to be followed by [facility].

## 2011-05-12 NOTE — Assessment & Plan Note (Signed)
He has not been having significant angina at this time.  We continue to follow him.  He had redo in 2005, and has a radial to one vessel and an IMA to a second, with redo SVG to the third.  Continue medical therapy.

## 2011-05-12 NOTE — Progress Notes (Signed)
Patient ID: Jonathan Arroyo, male   DOB: 1929/12/20, 77 y.o.   MRN: [id number]

## 2011-07-02 ENCOUNTER — Ambulatory Visit: Payer: Medicare Other | Admitting: Vascular Surgery

## 2011-07-08 ENCOUNTER — Encounter: Payer: Self-pay | Admitting: Cardiology

## 2011-07-08 ENCOUNTER — Encounter: Payer: Self-pay | Admitting: Vascular Surgery

## 2011-07-08 ENCOUNTER — Ambulatory Visit (INDEPENDENT_AMBULATORY_CARE_PROVIDER_SITE_OTHER): Payer: Medicare Other | Admitting: Cardiology

## 2011-07-08 DIAGNOSIS — I714 Abdominal aortic aneurysm, without rupture: Secondary | ICD-10-CM

## 2011-07-08 DIAGNOSIS — I2510 Atherosclerotic heart disease of native coronary artery without angina pectoris: Secondary | ICD-10-CM

## 2011-07-08 DIAGNOSIS — I10 Essential (primary) hypertension: Secondary | ICD-10-CM

## 2011-07-08 DIAGNOSIS — E785 Hyperlipidemia, unspecified: Secondary | ICD-10-CM

## 2011-07-08 NOTE — Patient Instructions (Signed)
Your physician recommends that you schedule a follow-up appointment in: 4 WEEKS with Dr Stuckey  Your physician recommends that you continue on your current medications as directed. Please refer to the Current Medication list given to you today.   

## 2011-07-08 NOTE — Assessment & Plan Note (Signed)
Checked by his primary care provider.

## 2011-07-08 NOTE — Assessment & Plan Note (Signed)
He is being followed at [facility] at this point.  TS

## 2011-07-08 NOTE — Assessment & Plan Note (Signed)
Currently stable without symptoms.

## 2011-07-08 NOTE — Assessment & Plan Note (Signed)
Seemingly up but really all over the map at this point.  As such, Dr. Shary Decamp made some appropriate changes.  However, the patient is concerned about doubling the dose, so he is going to try 0.2 mg BID and watch the BP, and will titrate up as he has been instructed to do.

## 2011-07-08 NOTE — Progress Notes (Signed)
HPI:  Patient presents with follow up.  No chest pain. BP has been largely variable.  Way up yesterday and his meds were adjusted.  Denies ongoing chest pain.  BP as low as 90, but yesterday as high as 180/130 and he felt dizzy, so dose of clonidine was doubled.  Was told he was in atrial fib, although strip was not done.  On today's strip, he has NSR with frequent apb with intermittent pauses, likely due to non conducted apbs.   Current Outpatient Prescriptions  Medication Sig Dispense Refill  . cloNIDine (CATAPRES) 0.2 MG tablet Take 0.2 mg by mouth 3 (three) times daily.      . clopidogrel (PLAVIX) 75 MG tablet Take 75 mg by mouth daily.        . finasteride (PROSCAR) 5 MG tablet Take 5 mg by mouth daily.        . furosemide (LASIX) 40 MG tablet Take 40 mg by mouth as needed.        Marland Kitchen HYDROcodone-acetaminophen (VICODIN) 5-500 MG per tablet Take 1 tablet by mouth every 6 (six) hours as needed.        Marland Kitchen lisinopril (PRINIVIL,ZESTRIL) 20 MG tablet Take 20 mg by mouth 2 (two) times daily.        . nitroGLYCERIN (NITROSTAT) 0.4 MG SL tablet Place 0.4 mg under the tongue every 5 (five) minutes as needed.        . pantoprazole (PROTONIX) 40 MG tablet Take 40 mg by mouth daily.       . Polyethylene Glycol 8000 POWD Take by mouth 4 (four) times a week.       . simvastatin (ZOCOR) 40 MG tablet Take 40 mg by mouth at bedtime.        Marland Kitchen zolpidem (AMBIEN) 10 MG tablet Take 10 mg by mouth at bedtime as needed.          Allergies  Allergen Reactions  . Mylanta   . Oxycodone-Acetaminophen     Past Medical History  Diagnosis Date  . Other and unspecified hyperlipidemia   . Renal calculus     history  . Other specified gastritis without mention of hemorrhage   . Spinal stenosis, unspecified region other than cervical   . Hiatal hernia     HISTORY  . Acute venous embolism and thrombosis of unspecified deep vessels of lower extremity   . Abdominal aneurysm without mention of rupture   . Esophageal  reflux   . Unspecified essential hypertension   . Coronary atherosclerosis of unspecified type of vessel, native or graft     Past Surgical History  Procedure Date  . Ptca   . Total knee arthroplasty     right  . Coronary artery bypass graft     Family History  Problem Relation Age of Onset  . Coronary artery disease Mother     History   Social History  . Marital Status: Married    Spouse Name: N/A    Number of Children: N/A  . Years of Education: N/A   Occupational History  . Not on file.   Social History Main Topics  . Smoking status: Never Smoker   . Smokeless tobacco: Never Used  . Alcohol Use: No  . Drug Use: No  . Sexually Active: Not on file   Other Topics Concern  . Not on file   Social History Narrative  . No narrative on file    ROS: Please see the HPI.  All other  systems reviewed and negative.  PHYSICAL EXAM:  BP 132/70  Pulse 71  Ht 6\' 1"  (1.854 m)  Wt 186 lb 12.8 oz (84.732 kg)  BMI 24.65 kg/m2  General: Well developed, well nourished, in no acute distress. Head:  Normocephalic and atraumatic. Neck: no JVD Lungs: Clear to auscultation and percussion. Heart: Normal S1 and S2.  No murmur, rubs or gallops.  Abdomen:  Normal bowel sounds; soft; non tender; no organomegaly Pulses: Pulses normal in all 4 extremities. Extremities: No clubbing or cyanosis. No edema. Neurologic: Alert and oriented x 3.  EKG:  NSR with frequent conducted and non conducted apb.  Therefore, irregular rhythm  ASSESSMENT AND PLAN:

## 2011-07-09 ENCOUNTER — Ambulatory Visit (INDEPENDENT_AMBULATORY_CARE_PROVIDER_SITE_OTHER): Payer: Medicare Other | Admitting: Vascular Surgery

## 2011-07-09 ENCOUNTER — Encounter: Payer: Self-pay | Admitting: Vascular Surgery

## 2011-07-09 ENCOUNTER — Encounter (INDEPENDENT_AMBULATORY_CARE_PROVIDER_SITE_OTHER): Payer: Medicare Other | Admitting: *Deleted

## 2011-07-09 VITALS — BP 107/57 | HR 81 | Resp 16 | Ht 73.0 in | Wt 185.0 lb

## 2011-07-09 DIAGNOSIS — I714 Abdominal aortic aneurysm, without rupture, unspecified: Secondary | ICD-10-CM

## 2011-07-09 NOTE — Progress Notes (Signed)
[facility] HISTORY AND PHYSICAL   History of Present Illness:  Patient is a 78 y.o. year old male who presents for follow-up evaluation of AAA.  The patient denies new abdominal or back pain. He has chronic back pain from spinal stenosis. The patient's atherosclerotic risk factors remain elevated cholesterol, hypertension and coronary artery disease.  These are all currently stable and followed by their primary care physician.  The patients AAA was 4.6cm 12/2010.  Past Medical History  Diagnosis Date  . Other and unspecified hyperlipidemia   . Renal calculus     history  . Other specified gastritis without mention of hemorrhage   . Spinal stenosis, unspecified region other than cervical   . Hiatal hernia     HISTORY  . Acute venous embolism and thrombosis of unspecified deep vessels of lower extremity   . Abdominal aneurysm without mention of rupture   . Esophageal reflux   . Unspecified essential hypertension   . Coronary atherosclerosis of unspecified type of vessel, native or graft   . CHF (congestive heart failure)     Past Surgical History  Procedure Date  . Ptca   . Total knee arthroplasty     right  . Joint replacement 2005    Right knee  . Spine surgery     X's 3  . Coronary artery bypass graft 1994 and  2005    Review of Systems:  Neurologic: denies symptoms of TIA, amaurosis, or stroke Cardiac:denies shortness of breath or chest pain Pulmonary: denies cough or wheeze  Social History History  Substance Use Topics  . Smoking status: Never Smoker   . Smokeless tobacco: Never Used  . Alcohol Use: No    Allergies  Allergies  Allergen Reactions  . Mylanta   . Oxycodone-Acetaminophen      Current Outpatient Prescriptions  Medication Sig Dispense Refill  . cloNIDine (CATAPRES) 0.2 MG tablet Take 0.2 mg by mouth 3 (three) times daily.      . clopidogrel (PLAVIX) 75 MG tablet Take 75 mg by mouth daily.        . finasteride  (PROSCAR) 5 MG tablet Take 5 mg by mouth daily.        . furosemide (LASIX) 40 MG tablet Take 40 mg by mouth as needed.        Marland Kitchen HYDROcodone-acetaminophen (VICODIN) 5-500 MG per tablet Take 1 tablet by mouth every 6 (six) hours as needed.        Marland Kitchen lisinopril (PRINIVIL,ZESTRIL) 20 MG tablet Take 20 mg by mouth 2 (two) times daily.        . nitroGLYCERIN (NITROSTAT) 0.4 MG SL tablet Place 0.4 mg under the tongue every 5 (five) minutes as needed.        . pantoprazole (PROTONIX) 40 MG tablet Take 40 mg by mouth daily.       . Polyethylene Glycol 8000 POWD Take by mouth 4 (four) times a week.       . simvastatin (ZOCOR) 40 MG tablet Take 40 mg by mouth at bedtime.        Marland Kitchen zolpidem (AMBIEN) 10 MG tablet Take 10 mg by mouth at bedtime as needed.          Physical Examination  Filed Vitals:   07/09/11 0956  BP: 107/57  Pulse: 81  Resp: 16  Height: 6\' 1"  (1.854 m)  Weight: 185 lb (83.915 kg)  SpO2: 99%    Body mass index is 24.41 kg/(m^2).  General:  Alert and oriented, no acute distress HEENT: Normal Neck: No bruit or JVD Pulmonary: Clear to auscultation bilaterally Cardiac: Regular Rate and Rhythm without murmur Abdomen: Soft, non-tender, non-distended, normal bowel sounds, easily palpable pulsatile mass just to left of midline in epigastrium  DATA: Patient had aortic ultrasound today which I reviewed and interpreted. This shows the aneurysm diameter today is 4.3 cm in diameter compared to 4.6 cm in diameter in August 2012.   ASSESSMENT: Asymptomatic small aortic aneurysm with no significant change in size 4.3 cm today  PLAN:  Followup office visit an aortic ultrasound in 6 months  Fabienne Bruns, MD [facility] Office: [phone] Pager: [phone]  Fabienne Bruns, MD [facility] [place] Office: [phone] Pager: [phone]

## 2011-07-24 NOTE — Procedures (Unsigned)
DUPLEX ULTRASOUND OF ABDOMINAL AORTA  INDICATION:  Followup AAA.  HISTORY: Diabetes:  No Cardiac:  Yes Hypertension:  Yes Smoking:  No Connective Tissue Disorder: Family History:  No Previous Surgery:  No  DUPLEX EXAM:         AP (cm)                   TRANSVERSE (cm) Proximal             2.34 cm                   2.44 cm Mid                  1.85 cm                   1.81 cm Distal               4.34 cm                   4.22 cm Right Iliac          1.34 cm                   1.26 cm Left Iliac           1.45 cm                   1.53 cm  PREVIOUS:  Date: 12/18/2010  AP:  4.46  TRANSVERSE:  4.62  IMPRESSION: 1. Infrarenal AAA measuring approximately 4.34 cm x 4.22 cm on today's     exam. 2. Measurements from previous exam could not be duplicated. 3. Ectatic bilateral CIAs, which were not previously documented.  ___________________________________________ [name], MD  LT/MEDQ  D:  07/09/2011  T:  07/09/2011  Job:  [id number]

## 2011-08-07 ENCOUNTER — Encounter: Payer: Self-pay | Admitting: Cardiology

## 2011-08-07 ENCOUNTER — Ambulatory Visit (INDEPENDENT_AMBULATORY_CARE_PROVIDER_SITE_OTHER): Payer: Medicare Other | Admitting: Cardiology

## 2011-08-07 VITALS — BP 172/100 | HR 80 | Ht 73.0 in | Wt 185.4 lb

## 2011-08-07 DIAGNOSIS — I10 Essential (primary) hypertension: Secondary | ICD-10-CM

## 2011-08-07 DIAGNOSIS — I2510 Atherosclerotic heart disease of native coronary artery without angina pectoris: Secondary | ICD-10-CM

## 2011-08-07 DIAGNOSIS — I25810 Atherosclerosis of coronary artery bypass graft(s) without angina pectoris: Secondary | ICD-10-CM

## 2011-08-07 NOTE — Patient Instructions (Signed)
Your physician recommends that you schedule a follow-up appointment in: 6-8 WEEKS  Your physician recommends that you continue on your current medications as directed. Please refer to the Current Medication list given to you today.

## 2011-08-10 ENCOUNTER — Telehealth: Payer: Self-pay | Admitting: Cardiology

## 2011-08-10 NOTE — Telephone Encounter (Signed)
3/22 at 6pm b/p was 220/185  Pt b/p has been running 179/105 all weekend and he has taken it often pt has been dizzy but the dizziness has gotten better   Please ask Dr. Riley Kill can he take atenolol again if he keeps a close eye on b/p

## 2011-08-10 NOTE — Telephone Encounter (Signed)
1 hr post clonidine BP 176/92  Hr 61, pt wants to f/u with PCP/ he declines app today- going to funeral, i offered to get him an app for him tomorrow with PCP, will leave MSG for him with information. App@9am / dr Shary Decamp office given, pt informed with msg as planned.

## 2011-08-10 NOTE — Telephone Encounter (Signed)
Pt took clonidine just now for his second dose,  BP 160/78 P 52, pt states his BP is very erratic.  Advised him to take first bp 1-1.5 hrs after morning BP meds, once in evening and any other time he is not feeling well. Reviewed food intake, last night he ate sauerkraut which is high in sodium, reviewed food items high in sodium, asked him to avoid canned soups which he eats frequently, avoid sauces,gravies, canned veg, fast food and already prepared foods in the freezer section. Told pt I will call him back in an hour to see BP results, informed him Dr Riley Kill is in office tomorrow and will address further if needed. Pt agreed to plan.

## 2011-08-10 NOTE — Telephone Encounter (Signed)
Pt to get new bp and pulse for me and I will call back. Pt has been running 240/104 pulse in the upper 70's. Pt feeling anxious and usually don't take BP as often/ BP usually under better control. Pt is taking clonidine now three times a day, still not getting his BP under control.

## 2011-08-22 NOTE — Progress Notes (Signed)
HPI:  Biggest issue has been variability in blood pressure.  Pressure can be up and down, and various adjustments have been made.  No chest pain, or other cardiac symptoms per se.  He says that BP can be as low as 100/40.    Current Outpatient Prescriptions  Medication Sig Dispense Refill  . aspirin 81 MG tablet Take 81 mg by mouth daily.      . cloNIDine (CATAPRES) 0.2 MG tablet Take 0.2 mg by mouth 3 (three) times daily.      . clopidogrel (PLAVIX) 75 MG tablet Take 75 mg by mouth daily.        . finasteride (PROSCAR) 5 MG tablet Take 5 mg by mouth daily.        . furosemide (LASIX) 40 MG tablet Take 40 mg by mouth as needed.        Marland Kitchen HYDROcodone-acetaminophen (VICODIN) 5-500 MG per tablet Take 1 tablet by mouth every 6 (six) hours as needed.        Marland Kitchen lisinopril (PRINIVIL,ZESTRIL) 20 MG tablet Take 20 mg by mouth 2 (two) times daily.        . nitroGLYCERIN (NITROSTAT) 0.4 MG SL tablet Place 0.4 mg under the tongue every 5 (five) minutes as needed.        . pantoprazole (PROTONIX) 40 MG tablet Take 40 mg by mouth daily.       . Polyethylene Glycol 8000 POWD Take by mouth 4 (four) times a week.       . simvastatin (ZOCOR) 40 MG tablet Take 40 mg by mouth at bedtime.        Marland Kitchen zolpidem (AMBIEN) 10 MG tablet Take 10 mg by mouth at bedtime as needed.        Marland Kitchen DISCONTD: lisinopril (PRINIVIL,ZESTRIL) 20 MG tablet Take 1 tablet (20 mg total) by mouth daily.  30 tablet  0    Allergies  Allergen Reactions  . Mylanta   . Oxycodone-Acetaminophen     Past Medical History  Diagnosis Date  . Other and unspecified hyperlipidemia   . Renal calculus     history  . Other specified gastritis without mention of hemorrhage   . Spinal stenosis, unspecified region other than cervical   . Hiatal hernia     HISTORY  . Acute venous embolism and thrombosis of unspecified deep vessels of lower extremity   . Abdominal aneurysm without mention of rupture   . Esophageal reflux   . Unspecified essential  hypertension   . Coronary atherosclerosis of unspecified type of vessel, native or graft   . CHF (congestive heart failure)     Past Surgical History  Procedure Date  . Ptca   . Total knee arthroplasty     right  . Joint replacement 2005    Right knee  . Spine surgery     X's 3  . Coronary artery bypass graft 1994 and  2005    Family History  Problem Relation Age of Onset  . Coronary artery disease Mother   . Diabetes Mother   . Heart disease Mother   . Hyperlipidemia Mother   . Hypertension Mother   . Heart disease Father   . Hyperlipidemia Father   . Hypertension Father     History   Social History  . Marital Status: Married    Spouse Name: N/A    Number of Children: N/A  . Years of Education: N/A   Occupational History  . Not on file.  Social History Main Topics  . Smoking status: Never Smoker   . Smokeless tobacco: Never Used  . Alcohol Use: No  . Drug Use: No  . Sexually Active: Not on file   Other Topics Concern  . Not on file   Social History Narrative  . No narrative on file    ROS: Please see the HPI.  All other systems reviewed and negative.  PHYSICAL EXAM:  BP 172/100  Pulse 80  Ht 6\' 1"  (1.854 m)  Wt 185 lb 6.4 oz (84.097 kg)  BMI 24.46 kg/m2  General: Well developed, well nourished, in no acute distress. Head:  Normocephalic and atraumatic. Neck: no JVD Lungs: Clear to auscultation and percussion.  Slightly prolonged expiration Heart: Normal S1 and S2.   Pulses: Pulses normal in all 4 extremities. Extremities: No clubbing or cyanosis. No edema. Neurologic: Alert and oriented x 3.  EKG:  NSR with PACs.  Inferior MI, old.  No acute changes.    ASSESSMENT AND PLAN:

## 2011-08-24 NOTE — Assessment & Plan Note (Signed)
Seems to be the biggest issue.  It is labile.  Meds have recently been switched.  His BP is up today, but as he pointed out it can fall quickly and he can get dizzy.  Question if a Catapres patch might provide a smoother delivery, but he is being followed closely by his primary MD.  He will continue to monitor his BPs at home closely.

## 2011-08-24 NOTE — Assessment & Plan Note (Signed)
No recurrent chest pain at this time.  Patient has had redo CABG and remains stable.

## 2011-09-18 ENCOUNTER — Ambulatory Visit (INDEPENDENT_AMBULATORY_CARE_PROVIDER_SITE_OTHER): Payer: Medicare Other | Admitting: Cardiology

## 2011-09-18 ENCOUNTER — Encounter: Payer: Self-pay | Admitting: Cardiology

## 2011-09-18 VITALS — BP 160/86 | HR 70 | Ht 73.0 in | Wt 177.4 lb

## 2011-09-18 DIAGNOSIS — I2510 Atherosclerotic heart disease of native coronary artery without angina pectoris: Secondary | ICD-10-CM

## 2011-09-18 DIAGNOSIS — I714 Abdominal aortic aneurysm, without rupture, unspecified: Secondary | ICD-10-CM

## 2011-09-18 DIAGNOSIS — I10 Essential (primary) hypertension: Secondary | ICD-10-CM

## 2011-09-18 LAB — BASIC METABOLIC PANEL
BUN: 21 mg/dL (ref 6–23)
CO2: 30 mEq/L (ref 19–32)
Calcium: 9.8 mg/dL (ref 8.4–10.5)
Chloride: 104 mEq/L (ref 96–112)
Creat: 1.07 mg/dL (ref 0.50–1.35)

## 2011-09-18 NOTE — Assessment & Plan Note (Signed)
Continues to be variable.  Not too bad today.  Would continue same regimen.  However, need to watch K closely.  Recheck BMET today.

## 2011-09-18 NOTE — Progress Notes (Signed)
HPI:  He is stable.  His BP continues to remain variable.  He has been placed on spirinolactone.  The systolics vary, but generally are 180 or less systolic.  Denies chest pain.    Current Outpatient Prescriptions  Medication Sig Dispense Refill  . aspirin 81 MG tablet Take 81 mg by mouth daily.      . cloNIDine (CATAPRES) 0.2 MG tablet Take 0.2 mg by mouth 3 (three) times daily.      . clopidogrel (PLAVIX) 75 MG tablet Take 75 mg by mouth daily.        . finasteride (PROSCAR) 5 MG tablet Take 5 mg by mouth daily.        . furosemide (LASIX) 40 MG tablet Take 40 mg by mouth as needed.        Marland Kitchen HYDROcodone-acetaminophen (VICODIN) 5-500 MG per tablet Take 1 tablet by mouth every 6 (six) hours as needed.        Marland Kitchen lisinopril (PRINIVIL,ZESTRIL) 20 MG tablet Take 20 mg by mouth 2 (two) times daily.        . nitroGLYCERIN (NITROSTAT) 0.4 MG SL tablet Place 0.4 mg under the tongue every 5 (five) minutes as needed.        . pantoprazole (PROTONIX) 40 MG tablet Take 40 mg by mouth daily.       . Polyethylene Glycol 8000 POWD Take by mouth 4 (four) times a week.       . simvastatin (ZOCOR) 40 MG tablet Take 40 mg by mouth at bedtime.        Marland Kitchen spironolactone (ALDACTONE) 25 MG tablet Take 25 mg by mouth daily.      Marland Kitchen zolpidem (AMBIEN) 10 MG tablet Take 10 mg by mouth at bedtime as needed.        Marland Kitchen DISCONTD: lisinopril (PRINIVIL,ZESTRIL) 20 MG tablet Take 1 tablet (20 mg total) by mouth daily.  30 tablet  0    Allergies  Allergen Reactions  . Alum & Mag Hydroxide-Simeth   . Oxycodone-Acetaminophen     Past Medical History  Diagnosis Date  . Other and unspecified hyperlipidemia   . Renal calculus     history  . Other specified gastritis without mention of hemorrhage   . Spinal stenosis, unspecified region other than cervical   . Hiatal hernia     HISTORY  . Acute venous embolism and thrombosis of unspecified deep vessels of lower extremity   . Abdominal aneurysm without mention of rupture     . Esophageal reflux   . Unspecified essential hypertension   . Coronary atherosclerosis of unspecified type of vessel, native or graft   . CHF (congestive heart failure)     Past Surgical History  Procedure Date  . Ptca   . Total knee arthroplasty     right  . Joint replacement 2005    Right knee  . Spine surgery     X's 3  . Coronary artery bypass graft 1994 and  2005    Family History  Problem Relation Age of Onset  . Coronary artery disease Mother   . Diabetes Mother   . Heart disease Mother   . Hyperlipidemia Mother   . Hypertension Mother   . Heart disease Father   . Hyperlipidemia Father   . Hypertension Father     History   Social History  . Marital Status: Married    Spouse Name: N/A    Number of Children: N/A  . Years of Education: N/A  Occupational History  . Not on file.   Social History Main Topics  . Smoking status: Never Smoker   . Smokeless tobacco: Never Used  . Alcohol Use: No  . Drug Use: No  . Sexually Active: Not on file   Other Topics Concern  . Not on file   Social History Narrative  . No narrative on file    ROS: Please see the HPI.  All other systems reviewed and negative.  PHYSICAL EXAM:  BP 160/86  Pulse 70  Ht 6\' 1"  (1.854 m)  Wt 177 lb 6.4 oz (80.468 kg)  BMI 23.41 kg/m2  BP confirmed by me.   General: Well developed, well nourished, in no acute distress. Head:  Normocephalic and atraumatic. Neck: no JVD Lungs: Clear to auscultation and percussion. Heart: Normal S1 and S2.  No murmur, rubs or gallops.  Pulses: Pulses normal in all 4 extremities. Extremities: No clubbing or cyanosis. No edema. Neurologic: Alert and oriented x 3.  EKG:  NSR.  Sinus arrhythmia with occasional PVCs.  Inferior posterior MI.  Age undetermined.    ASSESSMENT AND PLAN:

## 2011-09-18 NOTE — Patient Instructions (Signed)
Your physician recommends that you schedule a follow-up appointment in: 2 MONTHS with Dr Riley Kill  Your physician recommends that you have lab work today: [facility]  Your physician recommends that you continue on your current medications as directed. Please refer to the Current Medication list given to you today.

## 2011-09-18 NOTE — Assessment & Plan Note (Signed)
Being followed by [facility].  

## 2011-09-18 NOTE — Assessment & Plan Note (Signed)
Nor recurrent chest pain.

## 2011-10-02 ENCOUNTER — Encounter (HOSPITAL_COMMUNITY): Payer: Self-pay | Admitting: *Deleted

## 2011-10-02 ENCOUNTER — Emergency Department (HOSPITAL_COMMUNITY)
Admission: EM | Admit: 2011-10-02 | Discharge: 2011-10-02 | Disposition: A | Discharge status: Home / Self Care | Payer: Medicare Other | Attending: Emergency Medicine | Admitting: Emergency Medicine

## 2011-10-02 DIAGNOSIS — I2510 Atherosclerotic heart disease of native coronary artery without angina pectoris: Secondary | ICD-10-CM | POA: Insufficient documentation

## 2011-10-02 DIAGNOSIS — I10 Essential (primary) hypertension: Secondary | ICD-10-CM | POA: Insufficient documentation

## 2011-10-02 DIAGNOSIS — R1031 Right lower quadrant pain: Secondary | ICD-10-CM

## 2011-10-02 DIAGNOSIS — I714 Abdominal aortic aneurysm, without rupture, unspecified: Secondary | ICD-10-CM | POA: Insufficient documentation

## 2011-10-02 DIAGNOSIS — K219 Gastro-esophageal reflux disease without esophagitis: Secondary | ICD-10-CM | POA: Insufficient documentation

## 2011-10-02 DIAGNOSIS — Z86718 Personal history of other venous thrombosis and embolism: Secondary | ICD-10-CM | POA: Insufficient documentation

## 2011-10-02 DIAGNOSIS — E785 Hyperlipidemia, unspecified: Secondary | ICD-10-CM | POA: Insufficient documentation

## 2011-10-02 DIAGNOSIS — R1011 Right upper quadrant pain: Secondary | ICD-10-CM | POA: Insufficient documentation

## 2011-10-02 DIAGNOSIS — R110 Nausea: Secondary | ICD-10-CM | POA: Insufficient documentation

## 2011-10-02 DIAGNOSIS — Z79899 Other long term (current) drug therapy: Secondary | ICD-10-CM | POA: Insufficient documentation

## 2011-10-02 DIAGNOSIS — Z7982 Long term (current) use of aspirin: Secondary | ICD-10-CM | POA: Insufficient documentation

## 2011-10-02 DIAGNOSIS — I509 Heart failure, unspecified: Secondary | ICD-10-CM | POA: Insufficient documentation

## 2011-10-02 LAB — ABO/RH: ABO/RH(D): A POS

## 2011-10-02 LAB — TYPE AND SCREEN: ABO/RH(D): A POS

## 2011-10-02 NOTE — ED Provider Notes (Signed)
Pt seen with resident on arrival He was transferred from [facility] for AAA which has increased from previous size Pt is stable, awake/alert, vascular has been paged BP 184/93  Pulse 83  Temp(Src) 98.2 F (36.8 C) (Oral)  SpO2 99%   Joya Gaskins, MD 10/02/11 1637

## 2011-10-02 NOTE — Discharge Instructions (Signed)
Abdominal Aortic Aneurysm  An aneurysm is the enlargement (dilatation), bulging, or ballooning out of part of the wall of a vein or artery. An aortic aneurysm is a bulging in the largest artery of the body. This artery supplies blood from the heart to the rest of the body.  The first part of the aorta is called the thoracic aorta. It leaves the heart, rises (ascends), arches, and goes down (descends) through the chest until it reaches the diaphragm. The diaphragm is the muscular part between the chest and abdomen.   The second part of the aorta is called the abdominal aorta after it has passed the diaphragm and continues down through the abdomen. The abdominal aorta ends where it splits to form the two iliac arteries that go to the legs.  Aortic aneurysms can develop anywhere along the length of the aorta. The majority are located along the abdominal aorta. The major concern with an aortic aneurysm is that it can enlarge and rupture. This can cause death unless diagnosed and treated promptly. Aneurysms can also develop blood clots or infections. CAUSES  Many aortic aneurysms are caused by arteriosclerosis. Arteriosclerosis can weaken the aortic wall. The pressure of the blood being pumped through the aorta causes it to balloon out at the site of weakness. Therefore, high blood pressure (hypertension) is associated with aneurysm. Other risk factors include:  Age over 60.   Tobacco use.   Being male.   White race.   Family history of aneurysm.   Less frequent causes of abdominal aortic aneurysms include:   Connective tissue diseases.   Abdominal trauma.   Inflammation of blood vessles (arteritis).   Inherited (congenital) malformations.   Infection.  SYMPTOMS  The signs and symptoms of an unruptured aneurysm will partly depend on its size and rate of growth.   Abdominal aortic aneurysms may cause pain. The pain typically has a deep quality as if it is piercing into the person. It is  felt most often in the lower back area. The pain is usually steady but may be relieved by changing your body position.   The person may also become aware of an abnormally prominent pulse in the belly (abdominal pulsation).  DIAGNOSIS  An aortic aneurysm may be discovered by chance on physical exam, or on X-ray studies done for other reasons. It may be suspected because of other problems such as back or abdominal pain. The following tests may help identify the problem.  X-rays of the abdomen can show calcium deposits in the aneurysm wall.   CT scanning of the abdomen, particularly with contrast medium, is accurate at showing the exact size and shape of the aneurysm.   Ultrasounds give a clear picture of the size of an aneurysm (about 98% accuracy).   MRI scanning is accurate, but often unnecessary.   An abdominal angiogram shows the source of the major blood vessels arising from the aorta. It reveals the size and extent of any aneurysm. It can also show a clot clinging to the wall of the aneurysm (mural thrombus).  TREATMENT  Treating an abdominal aortic aneurysm depends on the size. A rupture of an aneurysm is uncommon when they are less than 5 cm wide (2 inches). Rupture is far more common in aneurysms that are over 6 cm wide (2.4 inches).  Surgical repair is usually recommended for all aneurysms over 6 cm wide (2.4 inches). This depends on the health, age, and other circumstances of the individual. This type of surgery consists of   opening the abdomen, removing the aneurysm, and sewing a synthetic graft (similar to a cloth tube) in its place. A less invasive form of this surgery, using stent grafts, is sometimes recommended.   For most patients, elective repair is recommended for aneurysms between 4 and 6 cm (1.6 and 2.4 inches). Elective means the surgery can be done at your convenience. This should not be put off too long if surgery is recommended.   If you smoke, stop immediately. Smoking  is a major risk factor for enlargement and rupture.   Medications may be used to help decrease complications - these include medicine to lower blood pressure and control cholesterol.  HOME CARE INSTRUCTIONS   If you smoke, stop. Do not start smoking.   Take all medications as prescribed.   Your caregiver will tell you when to have your aneurysm rechecked, either by ultrasound or CT scan.   If your caregiver has given you a follow-up appointment, it is very important to keep that appointment. Not keeping the appointment could result in a chronic or permanent injury, pain, or disability. If there is any problem keeping the appointment, you must call back to this facility for assistance.  SEEK MEDICAL CARE IF:   You develop mild abdominal pain or pressure.   You are able to feel or perceive your aneurysm, and you sense any change.  SEEK IMMEDIATE MEDICAL CARE IF:   You develop severe abdominal pain, or severe pain moving (radiating) to your back.   You suddenly develop cold or blue toes or feet.   You suddenly develop lightheadedness or fainting spells.  MAKE SURE YOU:   Understand these instructions.   Will watch your condition.   Will get help right away if you are not doing well or get worse.  Document Released: 02/11/2005 Document Revised: 04/23/2011 Document Reviewed: 12/06/2007 [employer] Patient Information 2012 [employer]. 

## 2011-10-02 NOTE — ED Notes (Signed)
Patient request to use bathroom for BM. EDP advised of request and states to allow patient to use [facility] and stay on monitor.

## 2011-10-02 NOTE — ED Provider Notes (Signed)
History     CSN: [id number]  Arrival date & time 10/02/11  1612   First MD Initiated Contact with Patient 10/02/11 1618      No chief complaint on file.   (Consider location/radiation/quality/duration/timing/severity/associated sxs/prior treatment) Patient is a 78 y.o. male presenting with abdominal pain. The history is provided by the patient.  Abdominal Pain The primary symptoms of the illness include abdominal pain and nausea. The primary symptoms of the illness do not include fever, shortness of breath, vomiting, diarrhea or dysuria. Episode onset: several weeks ago. The onset of the illness was gradual. The problem has been gradually worsening.  The abdominal pain began more than 2 days ago. The pain came on gradually. The abdominal pain has been gradually worsening since its onset. The abdominal pain is located in the RUQ. The abdominal pain radiates to the back. The abdominal pain is relieved by nothing. Exacerbated by: laying flat.  The patient has not had a change in bowel habit. Symptoms associated with the illness do not include hematuria.    Past Medical History  Diagnosis Date  . Other and unspecified hyperlipidemia   . Renal calculus     history  . Other specified gastritis without mention of hemorrhage   . Spinal stenosis, unspecified region other than cervical   . Hiatal hernia     HISTORY  . Acute venous embolism and thrombosis of unspecified deep vessels of lower extremity   . Abdominal aneurysm without mention of rupture   . Esophageal reflux   . Unspecified essential hypertension   . Coronary atherosclerosis of unspecified type of vessel, native or graft   . CHF (congestive heart failure)     Past Surgical History  Procedure Date  . Ptca   . Total knee arthroplasty     right  . Joint replacement 2005    Right knee  . Spine surgery     X's 3  . Coronary artery bypass graft 1994 and  2005    Family History  Problem Relation Age of Onset  . Coronary  artery disease Mother   . Diabetes Mother   . Heart disease Mother   . Hyperlipidemia Mother   . Hypertension Mother   . Heart disease Father   . Hyperlipidemia Father   . Hypertension Father     History  Substance Use Topics  . Smoking status: Never Smoker   . Smokeless tobacco: Never Used  . Alcohol Use: No      Review of Systems  Constitutional: Negative for fever.  HENT: Negative for rhinorrhea, drooling and neck pain.   Eyes: Negative for pain.  Respiratory: Negative for cough and shortness of breath.   Cardiovascular: Negative for chest pain and leg swelling.  Gastrointestinal: Positive for nausea and abdominal pain. Negative for vomiting and diarrhea.  Genitourinary: Negative for dysuria and hematuria.  Musculoskeletal: Negative for gait problem.  Skin: Negative for color change.  Neurological: Negative for numbness and headaches.  Hematological: Negative for adenopathy.  Psychiatric/Behavioral: Negative for behavioral problems.  All other systems reviewed and are negative.    Allergies  Alum & mag hydroxide-simeth and Oxycodone-acetaminophen  Home Medications   Current Outpatient Rx  Name Route Sig Dispense Refill  . ASPIRIN 81 MG PO TABS Oral Take 81 mg by mouth daily.    Marland Kitchen CLONIDINE HCL 0.2 MG PO TABS Oral Take 0.2 mg by mouth 3 (three) times daily.    Marland Kitchen CLOPIDOGREL BISULFATE 75 MG PO TABS Oral Take 75 mg  by mouth daily.      Marland Kitchen FINASTERIDE 5 MG PO TABS Oral Take 5 mg by mouth daily.      Marland Kitchen HYDROCODONE-ACETAMINOPHEN 5-500 MG PO TABS Oral Take 1 tablet by mouth every 6 (six) hours as needed. For pain    . LISINOPRIL 20 MG PO TABS Oral Take 20 mg by mouth 2 (two) times daily.      Marland Kitchen NITROGLYCERIN 0.4 MG SL SUBL Sublingual Place 0.4 mg under the tongue every 5 (five) minutes as needed. For chest pain    . PANTOPRAZOLE SODIUM 40 MG PO TBEC Oral Take 40 mg by mouth daily.     Marland Kitchen POLYETHYLENE GLYCOL 8000 POWD Oral Take by mouth 4 (four) times a week.     Marland Kitchen  SIMVASTATIN 40 MG PO TABS Oral Take 40 mg by mouth at bedtime.      . SPIRONOLACTONE 25 MG PO TABS Oral Take 12.5 mg by mouth 2 (two) times daily.     Marland Kitchen ZOLPIDEM TARTRATE 10 MG PO TABS Oral Take 10 mg by mouth at bedtime as needed. For insomnia      BP 184/93  Pulse 83  Temp(Src) 98.2 F (36.8 C) (Oral)  SpO2 99%  Physical Exam  Constitutional: He is oriented to person, place, and time. He appears well-developed and well-nourished.  HENT:  Head: Normocephalic and atraumatic.  Right Ear: External ear normal.  Left Ear: External ear normal.  Nose: Nose normal.  Mouth/Throat: Oropharynx is clear and moist. No oropharyngeal exudate.  Eyes: Conjunctivae and EOM are normal. Pupils are equal, round, and reactive to light.  Neck: Normal range of motion. Neck supple.  Cardiovascular: Normal rate, regular rhythm, normal heart sounds and intact distal pulses.  Exam reveals no gallop and no friction rub.   No murmur heard. Pulmonary/Chest: Effort normal and breath sounds normal. No respiratory distress. He has no wheezes.  Abdominal: Soft. Bowel sounds are normal. He exhibits no distension. There is no tenderness.  Musculoskeletal: Normal range of motion. He exhibits no edema and no tenderness.  Neurological: He is alert and oriented to person, place, and time.  Skin: Skin is warm and dry.  Psychiatric: He has a normal mood and affect. His behavior is normal.    ED Course  Procedures (including critical care time)   Labs Reviewed  TYPE AND SCREEN   No results found.   No diagnosis found.   Date: 10/03/2011  Rate: 80  Rhythm: normal sinus rhythm and indeterminate  QRS Axis: left  Intervals: normal  ST/T Wave abnormalities: nonspecific T wave changes  Conduction Disutrbances:none  Narrative Interpretation: No new ST or T wave changes cw ischemia  Old EKG Reviewed: unchanged    MDM  4:37 PM 78 y.o. male w hx of AAA, CHF on plavix/asa pw RUQ abd pain for several weeks. Pt seen  at [facility] today, found to have inc in AAA from 45mm to 50mm w/in last year. Pt transferred here and accepted by vascular. Pt AFVSS now, denies pain, abdomen benign. Will get type and screen.    4:37 PM Dr. Edilia Bo (vasc surgeon) aware of pt's arrival.   7:40 PM: Pt continues to appear well on exam. Dr. Edilia Bo has seen and eval pt and believes him to be stable for d/c and f/u at his regularly scheduled appt. Pt refused Rx for pain medicine. I have discussed the diagnosis/risks/treatment options with the patient and family and believe the pt to be eligible for discharge home to follow-up  with his surgeon Dr. Darrick Penna as scheduled. We also discussed returning to the ED immediately if new or worsening sx occur. We discussed the sx which are most concerning (e.g., worsening pain, syncope, sob) that necessitate immediate return. Any new prescriptions provided to the patient are listed below.  New Prescriptions   No medications on file   Clinical Impression 1. ABDOMINAL AORTIC ANEURYSM          Purvis Sheffield, MD 10/03/11 1610  Purvis Sheffield, MD 10/03/11 [phone]

## 2011-10-02 NOTE — ED Notes (Signed)
Patient remains on monitor and sats of 100% on RA. Patient is hypertensive and states he has been this way x 2 months. Patient talking with staff and family and denies any pain.

## 2011-10-02 NOTE — ED Notes (Signed)
Pt's IV removed, changed back into clothes and given snack per MDs approval

## 2011-10-02 NOTE — Consult Note (Signed)
[facility] [place]  Patient name: Jonathan Arroyo MRN: [id number] DOB: 1929/10/02 Sex: male  REASON FOR CONSULT: 5.0 cm abdominal aortic aneurysm with abdominal pain.  HPI: Jonathan Arroyo is a 78 y.o. male who was last seen by Dr. Darrick Penna on 07/09/2011. At the time of his last visit the aneurysm measured 4.3 cm in maximum diameter by ultrasound. However previous study in August of 2012 showed the aneurysm to be 4.6 cm in maximum diameter. He states he's had vague right-sided abdominal pain off and on for approximately 2 weeks. This was not acute in onset. He's had some chronic back pain related to spinal stenosis. The pain was mostly on the right side and he underwent an ultrasound which did not show any evidence of choledocholithiasis. He was sent for a CT scan which showed diverticulosis and possibly some mild diverticulitis. He was incidentally noted that the aneurysm increased in size to 5.0 cm by the radiologist's interpretation. He sent for vascular consultation.  The patient states that his pain has essentially resolved. He notes that it was gradual in onset it is even having this off and on for 2 weeks. As a long history of diverticulosis. He's had no fever or chills. He said no leg pain.  Past Medical History  Diagnosis Date  . Other and unspecified hyperlipidemia   . Renal calculus     history  . Other specified gastritis without mention of hemorrhage   . Spinal stenosis, unspecified region other than cervical   . Hiatal hernia     HISTORY  . Acute venous embolism and thrombosis of unspecified deep vessels of lower extremity   . Abdominal aneurysm without mention of rupture   . Esophageal reflux   . Unspecified essential hypertension   . Coronary atherosclerosis of unspecified type of vessel, native or graft   . CHF (congestive heart failure)     Family History  Problem Relation Age of Onset  . Coronary artery disease Mother   . Diabetes Mother   .  Heart disease Mother   . Hyperlipidemia Mother   . Hypertension Mother   . Heart disease Father   . Hyperlipidemia Father   . Hypertension Father     SOCIAL HISTORY: History  Substance Use Topics  . Smoking status: Never Smoker   . Smokeless tobacco: Never Used  . Alcohol Use: No    Allergies  Allergen Reactions  . Alum & Mag Hydroxide-Simeth Other (See Comments)    unknwn  . Oxycodone-Acetaminophen Other (See Comments)    unknown    No current facility-administered medications for this encounter.   Current Outpatient Prescriptions  Medication Sig Dispense Refill  . aspirin 81 MG tablet Take 81 mg by mouth daily.      . cloNIDine (CATAPRES) 0.2 MG tablet Take 0.2 mg by mouth 3 (three) times daily.      . clopidogrel (PLAVIX) 75 MG tablet Take 75 mg by mouth daily.        . finasteride (PROSCAR) 5 MG tablet Take 5 mg by mouth daily.        Marland Kitchen HYDROcodone-acetaminophen (VICODIN) 5-500 MG per tablet Take 1 tablet by mouth every 6 (six) hours as needed. For pain      . lisinopril (PRINIVIL,ZESTRIL) 20 MG tablet Take 20 mg by mouth 2 (two) times daily.        . nitroGLYCERIN (NITROSTAT) 0.4 MG SL tablet Place 0.4 mg under the tongue every 5 (five) minutes as needed.  For chest pain      . pantoprazole (PROTONIX) 40 MG tablet Take 40 mg by mouth daily.       . Polyethylene Glycol 8000 POWD Take by mouth 4 (four) times a week.       . simvastatin (ZOCOR) 40 MG tablet Take 40 mg by mouth at bedtime.        Marland Kitchen spironolactone (ALDACTONE) 25 MG tablet Take 12.5 mg by mouth 2 (two) times daily.       Marland Kitchen zolpidem (AMBIEN) 10 MG tablet Take 10 mg by mouth at bedtime as needed. For insomnia      . DISCONTD: lisinopril (PRINIVIL,ZESTRIL) 20 MG tablet Take 1 tablet (20 mg total) by mouth daily.  30 tablet  0    REVIEW OF SYSTEMS: Arly.Keller ] denotes positive finding; [  ] denotes negative finding CARDIOVASCULAR:  [ ]  chest pain   [ ]  chest pressure   [ ]  palpitations   [ ]  orthopnea   [ ]  dyspnea on  exertion   [ ]  claudication   [ ]  rest pain   [ ]  DVT   [ ]  phlebitis PULMONARY:   [ ]  productive cough   [ ]  asthma   [ ]  wheezing NEUROLOGIC:   [ ]  weakness  [ ]  paresthesias  [ ]  aphasia  [ ]  amaurosis  [ ]  dizziness HEMATOLOGIC:   [ ]  bleeding problems   [ ]  clotting disorders MUSCULOSKELETAL:  [ ]  joint pain   [ ]  joint swelling [ ]  leg swelling GASTROINTESTINAL: [ ]   blood in stool  [ ]   hematemesis GENITOURINARY:  [ ]   dysuria  [ ]   hematuria PSYCHIATRIC:  [ ]  history of major depression INTEGUMENTARY:  [ ]  rashes  [ ]  ulcers CONSTITUTIONAL:  [ ]  fever   [ ]  chills  PHYSICAL EXAM: Filed Vitals:   10/02/11 1613 10/02/11 1741 10/02/11 1833 10/02/11 1904  BP: 184/93 164/87 152/101 201/86  Pulse: 83 80 87 79  Temp: 98.2 F (36.8 C)     TempSrc: Oral     Resp:   14 19  SpO2: 99% 98% 99% 98%   There is no height or weight on file to calculate BMI. GENERAL: The patient is a well-nourished male, in no acute distress. The vital signs are documented above. CARDIOVASCULAR: There is a regular rate and rhythm without significant murmur appreciated. I do not detect carotid bruits. He has palpable femoral and pedal pulses bilaterally. PULMONARY: There is good air exchange bilaterally without wheezing or rales. ABDOMEN: Soft and non-tender with normal pitched bowel sounds. His abdominal aortic aneurysm is palpable and nontender. MUSCULOSKELETAL: There are no major deformities or cyanosis. NEUROLOGIC: No focal weakness or paresthesias are detected. SKIN: There are no ulcers or rashes noted. PSYCHIATRIC: The patient has a normal affect.  DATA:   Lab Results  Component Value Date   CREATININE 1.07 09/18/2011   I have independently reviewed his CT scan of the abdomen. I my interpretation the aneurysm measures 4.7 cm in maximum diameter. Thus there has been only slight increase in the size of his aneurysm. There is no evidence of leak or retroperitoneal hematoma.  MEDICAL ISSUES: ABDOMINAL  AORTIC ANEURYSM: There is only slight increase in the size of his abdominal aortic aneurysm. There is no evidence of leak or retroperitoneal hematoma. Do not think that his abdominal pain is related to his aneurysm. He does have evidence of diverticulosis. He will keep his regularly scheduled appointment in early  June for continued follow up of his aneurysm by Dr. Darrick Penna. Based on his CT scan he appears to be a good candidate for endovascular repair of his aneurysm. He knows to return to the emergency department if he has any acute onset abdominal or back pain.  Ashley Montminy S [facility] [place] Beeper: [phone]

## 2011-10-02 NOTE — ED Notes (Signed)
Dr. Edilia Bo saw patient is is discharging patient home.

## 2011-10-02 NOTE — ED Notes (Signed)
Patient states he has had RUQ pain x several days with radiating pain to his back. [facility] ultra sound was negative and CT showed 5mm increase in size of AAA. 2012 the  AAA was 45mm and today the CY showed the AAA is now 50mm. Patient transported to [facility] via [facility]. Patient has hx of COPD, MI CABG x 2. Patient states he has had 2 mornings of nausea and denies V/F/D/SOB. Patient placed on monitor and stas of 100% RA. Family at bedside.

## 2011-10-04 NOTE — ED Provider Notes (Signed)
I have personally seen and examined the patient.  I have discussed the plan of care with the resident.  I have reviewed the documentation on PMH/FH/Soc. History.  I have reviewed the documentation of the resident and agree.  I have reviewed and agree with the ECG interpretation(s) documented by the resident.  Pt seen with resident, stable in the ED, no distress D/w dr Edilia Bo, stable for d/c home  Joya Gaskins, MD 10/04/11 [phone]

## 2011-10-17 DIAGNOSIS — R109 Unspecified abdominal pain: Secondary | ICD-10-CM

## 2011-10-17 HISTORY — DX: Unspecified abdominal pain: R10.9

## 2011-11-24 ENCOUNTER — Encounter: Payer: Self-pay | Admitting: Cardiology

## 2011-11-24 ENCOUNTER — Ambulatory Visit (INDEPENDENT_AMBULATORY_CARE_PROVIDER_SITE_OTHER): Payer: Medicare Other | Admitting: Cardiology

## 2011-11-24 VITALS — BP 126/79 | HR 73 | Ht 73.0 in | Wt 169.0 lb

## 2011-11-24 DIAGNOSIS — I10 Essential (primary) hypertension: Secondary | ICD-10-CM

## 2011-11-24 DIAGNOSIS — I2510 Atherosclerotic heart disease of native coronary artery without angina pectoris: Secondary | ICD-10-CM

## 2011-11-24 NOTE — Assessment & Plan Note (Signed)
No recurrent chest pain. 

## 2011-11-24 NOTE — Patient Instructions (Addendum)
Your physician wants you to follow-up in: 6 months with Dr. Riley Kill. You will receive a reminder letter in the mail two months in advance. If you don't receive a letter, please call our office to schedule the follow-up appointment.  Your physician recommends that you continue on your current medications as directed. Please refer to the Current Medication list given to you today.

## 2011-11-24 NOTE — Progress Notes (Signed)
HPI: The patient is being seen in followup. In general he is been stable. He has been followed very closely by his primary care physician  (Dr. Shary Decamp) is doing a good job of managing the patient's blood pressure. The patient did tolerate he's taking spironolactone only on as needed basis, and he also is using clonidine only twice a day. We made adjustments in his medication list today as a result of these findings,. I encouraged him to provide this information to his primary care physician. He denies any specific cardiac complaints. He's not having any chest pain. He does have some considerable stress at home.   Current Outpatient Prescriptions  Medication Sig Dispense Refill  . aspirin 81 MG tablet Take 81 mg by mouth daily.      . busPIRone (BUSPAR) 10 MG tablet Take 10 mg by mouth 2 (two) times daily as needed.      . cloNIDine (CATAPRES) 0.2 MG tablet Take 1 tablet (0.2 mg total) by mouth 2 (two) times daily.      . clopidogrel (PLAVIX) 75 MG tablet Take 75 mg by mouth daily.        . finasteride (PROSCAR) 5 MG tablet Take 5 mg by mouth daily.        Marland Kitchen HYDROcodone-acetaminophen (VICODIN) 5-500 MG per tablet Take 1 tablet by mouth every 6 (six) hours as needed. For pain      . lisinopril (PRINIVIL,ZESTRIL) 20 MG tablet Take 20 mg by mouth 2 (two) times daily.        . nitroGLYCERIN (NITROSTAT) 0.4 MG SL tablet Place 0.4 mg under the tongue every 5 (five) minutes as needed. For chest pain      . pantoprazole (PROTONIX) 40 MG tablet Take 40 mg by mouth daily.       . Polyethylene Glycol 8000 POWD Take by mouth 4 (four) times a week.       . simvastatin (ZOCOR) 40 MG tablet Take 40 mg by mouth at bedtime.        Marland Kitchen spironolactone (ALDACTONE) 25 MG tablet Take 0.5 tablets (12.5 mg total) by mouth 2 (two) times daily as needed.      . zolpidem (AMBIEN) 10 MG tablet Take 10 mg by mouth at bedtime as needed. For insomnia      . DISCONTD: cloNIDine (CATAPRES) 0.2 MG tablet Take 0.2 mg by mouth 3  (three) times daily.      Marland Kitchen DISCONTD: spironolactone (ALDACTONE) 25 MG tablet Take 12.5 mg by mouth 2 (two) times daily.       Marland Kitchen DISCONTD: lisinopril (PRINIVIL,ZESTRIL) 20 MG tablet Take 1 tablet (20 mg total) by mouth daily.  30 tablet  0    Allergies  Allergen Reactions  . Alum & Mag Hydroxide-Simeth Other (See Comments)    unknwn  . Oxycodone-Acetaminophen Other (See Comments)    unknown    Past Medical History  Diagnosis Date  . Other and unspecified hyperlipidemia   . Renal calculus     history  . Other specified gastritis without mention of hemorrhage   . Spinal stenosis, unspecified region other than cervical   . Hiatal hernia     HISTORY  . Acute venous embolism and thrombosis of unspecified deep vessels of lower extremity   . Abdominal aneurysm without mention of rupture   . Esophageal reflux   . Unspecified essential hypertension   . Coronary atherosclerosis of unspecified type of vessel, native or graft   . CHF (congestive heart failure)  Past Surgical History  Procedure Date  . Ptca   . Total knee arthroplasty     right  . Joint replacement 2005    Right knee  . Spine surgery     X's 3  . Coronary artery bypass graft 1994 and  2005    Family History  Problem Relation Age of Onset  . Coronary artery disease Mother   . Diabetes Mother   . Heart disease Mother   . Hyperlipidemia Mother   . Hypertension Mother   . Heart disease Father   . Hyperlipidemia Father   . Hypertension Father     History   Social History  . Marital Status: Married    Spouse Name: N/A    Number of Children: N/A  . Years of Education: N/A   Occupational History  . Not on file.   Social History Main Topics  . Smoking status: Never Smoker   . Smokeless tobacco: Never Used  . Alcohol Use: No  . Drug Use: No  . Sexually Active: Not on file   Other Topics Concern  . Not on file   Social History Narrative  . No narrative on file    ROS: Please see the HPI.   All other systems reviewed and negative.  PHYSICAL EXAM:  BP 126/79  Pulse 73  Ht 6\' 1"  (1.854 m)  Wt 169 lb (76.658 kg)  BMI 22.30 kg/m2  General: Well developed, well nourished, in no acute distress. Head:  Normocephalic and atraumatic. Neck: no JVD Lungs: Clear to auscultation and percussion. Heart: Normal S1 and S2.  No murmur, rubs or gallops.  Abdomen:  Normal bowel sounds; soft; non tender; no organomegaly Pulses: Pulses normal in all 4 extremities. Extremities: No clubbing or cyanosis. No edema. Neurologic: Alert and oriented x 3.  EKG:    ASSESSMENT AND PLAN:

## 2011-11-24 NOTE — Assessment & Plan Note (Signed)
Being followed closely by Dr. Shary Decamp.  Encouraged him to share his medication information.

## 2012-01-08 ENCOUNTER — Other Ambulatory Visit: Payer: Self-pay | Admitting: *Deleted

## 2012-01-08 DIAGNOSIS — I714 Abdominal aortic aneurysm, without rupture: Secondary | ICD-10-CM

## 2012-01-13 ENCOUNTER — Encounter: Payer: Self-pay | Admitting: Neurosurgery

## 2012-01-14 ENCOUNTER — Encounter (INDEPENDENT_AMBULATORY_CARE_PROVIDER_SITE_OTHER): Payer: Medicare Other | Admitting: Vascular Surgery

## 2012-01-14 ENCOUNTER — Encounter: Payer: Self-pay | Admitting: Neurosurgery

## 2012-01-14 ENCOUNTER — Ambulatory Visit (INDEPENDENT_AMBULATORY_CARE_PROVIDER_SITE_OTHER): Payer: Medicare Other | Admitting: Neurosurgery

## 2012-01-14 VITALS — BP 171/102 | HR 89 | Resp 16 | Ht 74.0 in | Wt 175.2 lb

## 2012-01-14 DIAGNOSIS — I714 Abdominal aortic aneurysm, without rupture, unspecified: Secondary | ICD-10-CM

## 2012-01-14 NOTE — Addendum Note (Signed)
Addended by: Sharee Pimple on: 01/14/2012 01:39 PM   Modules accepted: Orders

## 2012-01-14 NOTE — Progress Notes (Signed)
[facility] [place] AAA/PAD/PVD Office Note  CC: Six-month AAA surveillance Referring Physician: Fields  History of Present Illness: 78 year old male patient of Dr. Darrick Penna seen for known AAA. The patient was brought to hospital in May with an episode of chest and abdominal pain which was not related to his aneurysm. At that time he was seen by Dr. Edilia Bo who recommended he followup with Dr. Darrick Penna as scheduled. Since that time the patient denies any other abdominal or back pain or any other vascular problems.  Past Medical History  Diagnosis Date  . Other and unspecified hyperlipidemia   . Renal calculus     history  . Other specified gastritis without mention of hemorrhage   . Spinal stenosis, unspecified region other than cervical   . Hiatal hernia     HISTORY  . Acute venous embolism and thrombosis of unspecified deep vessels of lower extremity   . Abdominal aneurysm without mention of rupture   . Esophageal reflux   . Unspecified essential hypertension   . Coronary atherosclerosis of unspecified type of vessel, native or graft   . CHF (congestive heart failure)     ROS: [x]  Positive   [ ]  Denies    General: [ ]  Weight loss, [ ]  Fever, [ ]  chills Neurologic: [ ]  Dizziness, [ ]  Blackouts, [ ]  Seizure [ ]  Stroke, [ ]  "Mini stroke", [ ]  Slurred speech, [ ]  Temporary blindness; [ ]  weakness in arms or legs, [ ]  Hoarseness Cardiac: [ ]  Chest pain/pressure, [ ]  Shortness of breath at rest [ ]  Shortness of breath with exertion, [ ]  Atrial fibrillation or irregular heartbeat Vascular: [ ]  Pain in legs with walking, [ ]  Pain in legs at rest, [ ]  Pain in legs at night,  [ ]  Non-healing ulcer, [ ]  Blood clot in vein/DVT,   Pulmonary: [ ]  Home oxygen, [ ]  Productive cough, [ ]  Coughing up blood, [ ]  Asthma,  [ ]  Wheezing Musculoskeletal:  [ ]  Arthritis, [ ]  Low back pain, [ ]  Joint pain Hematologic: [ ]  Easy Bruising, [ ]  Anemia; [ ]  Hepatitis Gastrointestinal: [ ]   Blood in stool, [ ]  Gastroesophageal Reflux/heartburn, [ ]  Trouble swallowing Urinary: [ ]  chronic Kidney disease, [ ]  on HD - [ ]  MWF or [ ]  TTHS, [ ]  Burning with urination, [ ]  Difficulty urinating Skin: [ ]  Rashes, [ ]  Wounds Psychological: [ ]  Anxiety, [ ]  Depression   Social History History  Substance Use Topics  . Smoking status: Never Smoker   . Smokeless tobacco: Never Used  . Alcohol Use: No    Family History Family History  Problem Relation Age of Onset  . Coronary artery disease Mother   . Diabetes Mother   . Heart disease Mother   . Hyperlipidemia Mother   . Hypertension Mother   . Heart disease Father   . Hyperlipidemia Father   . Hypertension Father     Allergies  Allergen Reactions  . Alum & Mag Hydroxide-Simeth Other (See Comments)    unknwn  . Oxycodone-Acetaminophen Other (See Comments)    unknown    Current Outpatient Prescriptions  Medication Sig Dispense Refill  . aspirin 81 MG tablet Take 81 mg by mouth daily.      . busPIRone (BUSPAR) 10 MG tablet Take 10 mg by mouth 2 (two) times daily as needed.      . cloNIDine (CATAPRES) 0.2 MG tablet Take 1 tablet (0.2 mg total) by mouth 2 (two)  times daily.      . clopidogrel (PLAVIX) 75 MG tablet Take 75 mg by mouth daily.        . finasteride (PROSCAR) 5 MG tablet Take 5 mg by mouth daily.        Marland Kitchen HYDROcodone-acetaminophen (VICODIN) 5-500 MG per tablet Take 1 tablet by mouth every 6 (six) hours as needed. For pain      . lisinopril (PRINIVIL,ZESTRIL) 20 MG tablet Take 20 mg by mouth 2 (two) times daily.        . nitroGLYCERIN (NITROSTAT) 0.4 MG SL tablet Place 0.4 mg under the tongue every 5 (five) minutes as needed. For chest pain      . pantoprazole (PROTONIX) 40 MG tablet Take 40 mg by mouth daily.       . Polyethylene Glycol 8000 POWD Take by mouth 4 (four) times a week.       . simvastatin (ZOCOR) 40 MG tablet Take 40 mg by mouth at bedtime.        Marland Kitchen spironolactone (ALDACTONE) 25 MG tablet Take 0.5  tablets (12.5 mg total) by mouth 2 (two) times daily as needed.      . zolpidem (AMBIEN) 10 MG tablet Take 10 mg by mouth at bedtime as needed. For insomnia        Physical Examination  Filed Vitals:   01/14/12 0947  BP: 171/102  Pulse: 89  Resp: 16    Body mass index is 22.49 kg/(m^2).  General:  WDWN in NAD Gait: Normal HEENT: WNL Eyes: Pupils equal Pulmonary: normal non-labored breathing , without Rales, rhonchi,  wheezing Cardiac: RRR, without  Murmurs, rubs or gallops; No carotid bruits Abdomen: soft, NT, no masses Skin: no rashes, ulcers noted Vascular Exam/Pulses: Palpable femoral pulses bilaterally, questionable abdominal pulsatile mass to palpation  Extremities without ischemic changes, no Gangrene , no cellulitis; no open wounds;  Musculoskeletal: no muscle wasting or atrophy  Neurologic: A&O X 3; Appropriate Affect ; SENSATION: normal; MOTOR FUNCTION:  moving all extremities equally. Speech is fluent/normal  Non-Invasive Vascular Imaging: AAA maximum diameter today is 4.7 transverse which is a slight increase from previous exam February 2013 at which time it was 4.3  ASSESSMENT/PLAN: Asymptomatic AAA ,we will follow this patient are 6 months with duplex. The patient knows the signs and symptoms of rupture and knows to report to the nearest emergency department should that occur. The patient will followup as scheduled in 6 months, his questions were encouraged and answered, he is in agreement with this plan.  Lauree Chandler ANP  Clinic M.D.: [facility]

## 2012-02-03 ENCOUNTER — Telehealth: Payer: Self-pay | Admitting: Cardiology

## 2012-02-03 NOTE — Telephone Encounter (Signed)
Pt needs to go off coumdain for 7 days for procedure with dr Alvester Morin, sent fax [phone] checking on status

## 2012-02-04 ENCOUNTER — Encounter: Payer: Self-pay | Admitting: Cardiology

## 2012-02-04 NOTE — Telephone Encounter (Signed)
Pt wants to talk about coming off his plavix for an injection

## 2012-02-04 NOTE — Telephone Encounter (Signed)
This encounter was created in error - please disregard.

## 2012-02-04 NOTE — Telephone Encounter (Signed)
This patient does not take coumadin, he is on plavix.  Dr Riley Kill reviewed form that was sent to our office and approved the pt holding plavix 7 days prior to epidural steroid injection.  I have faxed form to [phone].

## 2012-05-27 ENCOUNTER — Ambulatory Visit (INDEPENDENT_AMBULATORY_CARE_PROVIDER_SITE_OTHER): Payer: Medicare Other | Admitting: Cardiology

## 2012-05-27 ENCOUNTER — Encounter: Payer: Self-pay | Admitting: Cardiology

## 2012-05-27 VITALS — BP 127/73 | HR 58 | Ht 74.0 in | Wt 182.0 lb

## 2012-05-27 DIAGNOSIS — I25810 Atherosclerosis of coronary artery bypass graft(s) without angina pectoris: Secondary | ICD-10-CM

## 2012-05-27 DIAGNOSIS — I714 Abdominal aortic aneurysm, without rupture: Secondary | ICD-10-CM

## 2012-05-27 DIAGNOSIS — I10 Essential (primary) hypertension: Secondary | ICD-10-CM

## 2012-05-27 DIAGNOSIS — E785 Hyperlipidemia, unspecified: Secondary | ICD-10-CM

## 2012-05-27 NOTE — Assessment & Plan Note (Signed)
Seeing Dr. Darrick Penna.

## 2012-05-27 NOTE — Patient Instructions (Addendum)
Follow-up with Dr. Riley Kill in March  07/28/12 at 2:15pm  Continue your current medications as directed

## 2012-05-27 NOTE — Progress Notes (Signed)
HPI:  The patient is seen in a followup visit. Since I last saw him, he was seen in the hospital for concern over rupture abdominal aneurysm. It proved to be diverticulosis. Marland Kitchen   He is not having chest pain.  He does need to have an epidural injection, and came off of plavix the last time for this.  He denies ongoing symptoms.  He did have two brief episodes of chest pain.   His BP has been better controlled. He only takes spironolactone periodically.  He takes clonidine twice daily.  His CT did not show rupture, but slight increase to 5.0cm.  He is scheduled to see Dr. Darrick Penna again in  June. '  Current Outpatient Prescriptions  Medication Sig Dispense Refill  . aspirin 81 MG tablet Take 81 mg by mouth daily.      . busPIRone (BUSPAR) 10 MG tablet Take 10 mg by mouth 2 (two) times daily as needed.      . cloNIDine (CATAPRES) 0.2 MG tablet Take 0.1 mg by mouth daily.       . clopidogrel (PLAVIX) 75 MG tablet Take 75 mg by mouth daily.        . finasteride (PROSCAR) 5 MG tablet Take 5 mg by mouth daily.        Marland Kitchen HYDROcodone-acetaminophen (VICODIN) 5-500 MG per tablet Take 1 tablet by mouth every 6 (six) hours as needed. For pain      . nitroGLYCERIN (NITROSTAT) 0.4 MG SL tablet Place 0.4 mg under the tongue every 5 (five) minutes as needed. For chest pain      . pantoprazole (PROTONIX) 40 MG tablet Take 40 mg by mouth daily.       . Polyethylene Glycol 8000 POWD Take by mouth 4 (four) times a week.       . simvastatin (ZOCOR) 40 MG tablet Take 40 mg by mouth at bedtime.        Marland Kitchen spironolactone (ALDACTONE) 25 MG tablet Take 0.5 tablets (12.5 mg total) by mouth 2 (two) times daily as needed.      . zolpidem (AMBIEN) 10 MG tablet Take 10 mg by mouth at bedtime as needed. For insomnia      . lisinopril (PRINIVIL,ZESTRIL) 20 MG tablet Take 20 mg by mouth 2 (two) times daily.          Allergies  Allergen Reactions  . Alum & Mag Hydroxide-Simeth Other (See Comments)    unknwn  .  Oxycodone-Acetaminophen Other (See Comments)    unknown    Past Medical History  Diagnosis Date  . Other and unspecified hyperlipidemia   . Renal calculus     history  . Other specified gastritis without mention of hemorrhage   . Spinal stenosis, unspecified region other than cervical   . Hiatal hernia     HISTORY  . Acute venous embolism and thrombosis of unspecified deep vessels of lower extremity   . Abdominal aneurysm without mention of rupture   . Esophageal reflux   . Unspecified essential hypertension   . Coronary atherosclerosis of unspecified type of vessel, native or graft   . CHF (congestive heart failure)   . Stomach pain June 2013    Hospital stay at [facility]    Past Surgical History  Procedure Date  . Ptca   . Total knee arthroplasty     right  . Joint replacement 2005    Right knee  . Spine surgery     X's 3  . Coronary  artery bypass graft 1994 and  2005    Family History  Problem Relation Age of Onset  . Coronary artery disease Mother   . Diabetes Mother   . Heart disease Mother   . Hyperlipidemia Mother   . Hypertension Mother   . Heart disease Father   . Hyperlipidemia Father   . Hypertension Father     History   Social History  . Marital Status: Married    Spouse Name: N/A    Number of Children: N/A  . Years of Education: N/A   Occupational History  . Not on file.   Social History Main Topics  . Smoking status: Never Smoker   . Smokeless tobacco: Never Used  . Alcohol Use: No  . Drug Use: No  . Sexually Active: Not on file   Other Topics Concern  . Not on file   Social History Narrative  . No narrative on file    ROS: Please see the HPI.  All other systems reviewed and negative.  PHYSICAL EXAM:  BP 127/73  Pulse 58  Ht 6\' 2"  (1.88 m)  Wt 182 lb (82.555 kg)  BMI 23.37 kg/m2  SpO2 99%  General: Well developed, well nourished, in no acute distress. Head:  Normocephalic and atraumatic. Neck: no JVD Lungs: Clear to  auscultation and percussion. Heart: Normal S1 and S2.  No murmur, rubs or gallops.  Abdomen:  Normal bowel sounds; soft; non tender; no organomegaly.  I dont feel an enlargement.   Pulses: Pulses normal in all 4 extremities. Extremities: No clubbing or cyanosis. No edema. Neurologic: Alert and oriented x 3.  EKG:  SB.  PACs.  Ocassional PVCs.    ASSESSMENT AND PLAN:

## 2012-05-27 NOTE — Assessment & Plan Note (Signed)
We have discussed the continued use of DAPT given his circumstances.  He can hold for procedures, but would continue ASA.  He understands.

## 2012-05-30 ENCOUNTER — Encounter: Payer: Self-pay | Admitting: Cardiology

## 2012-05-30 NOTE — Telephone Encounter (Signed)
New Problem:    Patient called in wanting to let you know that he does not need a call back.

## 2012-05-30 NOTE — Telephone Encounter (Signed)
This encounter was created in error - please disregard.

## 2012-06-13 NOTE — Assessment & Plan Note (Signed)
Seems to be under better control, excellent given AAA.

## 2012-06-13 NOTE — Assessment & Plan Note (Signed)
On aggressive doses of lipid lowering therapy.  Continue as at present.

## 2012-07-13 ENCOUNTER — Encounter: Payer: Self-pay | Admitting: Neurosurgery

## 2012-07-14 ENCOUNTER — Ambulatory Visit (INDEPENDENT_AMBULATORY_CARE_PROVIDER_SITE_OTHER): Payer: Medicare Other | Admitting: Neurosurgery

## 2012-07-14 ENCOUNTER — Encounter: Payer: Self-pay | Admitting: Neurosurgery

## 2012-07-14 ENCOUNTER — Other Ambulatory Visit: Payer: Self-pay | Admitting: *Deleted

## 2012-07-14 ENCOUNTER — Encounter (INDEPENDENT_AMBULATORY_CARE_PROVIDER_SITE_OTHER): Payer: Medicare Other | Admitting: *Deleted

## 2012-07-14 VITALS — BP 135/77 | HR 62 | Resp 16 | Ht 74.0 in | Wt 183.0 lb

## 2012-07-14 DIAGNOSIS — I714 Abdominal aortic aneurysm, without rupture: Secondary | ICD-10-CM

## 2012-07-14 NOTE — Progress Notes (Signed)
[facility] [place] AAA/Carotid Office Note  CC: AAA surveillance Referring Physician: Fields  History of Present Illness: 79 year old male patient of Dr. Darrick Penna followed for known AAA. The patient denies any signs or symptoms of abdominal or back pain. The patient denies any new medical diagnoses or recent surgery.  Past Medical History  Diagnosis Date  . Other and unspecified hyperlipidemia   . Renal calculus     history  . Other specified gastritis without mention of hemorrhage   . Spinal stenosis, unspecified region other than cervical   . Hiatal hernia     HISTORY  . Acute venous embolism and thrombosis of unspecified deep vessels of lower extremity   . Abdominal aneurysm without mention of rupture   . Esophageal reflux   . Unspecified essential hypertension   . Coronary atherosclerosis of unspecified type of vessel, native or graft   . CHF (congestive heart failure)   . Stomach pain June 2013    Hospital stay at [facility]  . Irregular heart beat   . Myocardial infarction     ROS: [x]  Positive   [ ]  Denies    General: [ ]  Weight loss, [ ]  Fever, [ ]  chills Neurologic: [ ]  Dizziness, [ ]  Blackouts, [ ]  Seizure [ ]  Stroke, [ ]  "Mini stroke", [ ]  Slurred speech, [ ]  Temporary blindness; [ ]  weakness in arms or legs, [ ]  Hoarseness Cardiac: [ ]  Chest pain/pressure, [ ]  Shortness of breath at rest [ ]  Shortness of breath with exertion, [ ]  Atrial fibrillation or irregular heartbeat Vascular: [ ]  Pain in legs with walking, [ ]  Pain in legs at rest, [ ]  Pain in legs at night,  [ ]  Non-healing ulcer, [ ]  Blood clot in vein/DVT,   Pulmonary: [ ]  Home oxygen, [ ]  Productive cough, [ ]  Coughing up blood, [ ]  Asthma,  [ ]  Wheezing Musculoskeletal:  [ ]  Arthritis, [ ]  Low back pain, [ ]  Joint pain Hematologic: [ ]  Easy Bruising, [ ]  Anemia; [ ]  Hepatitis Gastrointestinal: [ ]  Blood in stool, [ ]  Gastroesophageal Reflux/heartburn, [ ]  Trouble swallowing Urinary: [ ]   chronic Kidney disease, [ ]  on HD - [ ]  MWF or [ ]  TTHS, [ ]  Burning with urination, [ ]  Difficulty urinating Skin: [ ]  Rashes, [ ]  Wounds Psychological: [ ]  Anxiety, [ ]  Depression   Social History History  Substance Use Topics  . Smoking status: Never Smoker   . Smokeless tobacco: Never Used  . Alcohol Use: No    Family History Family History  Problem Relation Age of Onset  . Coronary artery disease Mother   . Diabetes Mother   . Heart disease Mother   . Hyperlipidemia Mother   . Hypertension Mother   . Heart disease Father   . Hyperlipidemia Father   . Hypertension Father     Allergies  Allergen Reactions  . Alum & Mag Hydroxide-Simeth Other (See Comments)    unknwn  . Oxycodone-Acetaminophen Other (See Comments)    unknown    Current Outpatient Prescriptions  Medication Sig Dispense Refill  . aspirin 81 MG tablet Take 81 mg by mouth daily.      . busPIRone (BUSPAR) 10 MG tablet Take 10 mg by mouth 2 (two) times daily as needed.      . cloNIDine (CATAPRES) 0.2 MG tablet Take 0.1 mg by mouth 2 (two) times daily.       . clopidogrel (PLAVIX) 75 MG tablet Take 75  mg by mouth daily.        . finasteride (PROSCAR) 5 MG tablet Take 5 mg by mouth daily.        Marland Kitchen HYDROcodone-acetaminophen (VICODIN) 5-500 MG per tablet Take 1 tablet by mouth every 6 (six) hours as needed. For pain      . nitroGLYCERIN (NITROSTAT) 0.4 MG SL tablet Place 0.4 mg under the tongue every 5 (five) minutes as needed. For chest pain      . pantoprazole (PROTONIX) 40 MG tablet Take 40 mg by mouth daily.       . Polyethylene Glycol 8000 POWD Take by mouth 4 (four) times a week.       . simvastatin (ZOCOR) 40 MG tablet Take 40 mg by mouth at bedtime.        Marland Kitchen spironolactone (ALDACTONE) 25 MG tablet Take 0.5 tablets (12.5 mg total) by mouth 2 (two) times daily as needed.      . zolpidem (AMBIEN) 10 MG tablet Take 10 mg by mouth at bedtime as needed. For insomnia      . lisinopril (PRINIVIL,ZESTRIL) 20 MG  tablet Take 20 mg by mouth 2 (two) times daily.         No current facility-administered medications for this visit.    Physical Examination  Filed Vitals:   07/14/12 1048  BP: 135/77  Pulse: 62  Resp: 16    Body mass index is 23.49 kg/(m^2).  General:  WDWN in NAD Gait: Normal HEENT: WNL Eyes: Pupils equal Pulmonary: normal non-labored breathing , without Rales, rhonchi,  wheezing Cardiac: RRR, without  Murmurs, rubs or gallops; Abdomen: soft, NT, no masses Skin: no rashes, ulcers noted  Vascular Exam Pulses: 3+ radial pulses bilaterally, there is a mild. Abdominal nontender mass palpated Carotid bruits: Carotid pulses to auscultation no bruits are found Extremities without ischemic changes, no Gangrene , no cellulitis; no open wounds;  Musculoskeletal: no muscle wasting or atrophy   Neurologic: A&O X 3; Appropriate Affect ; SENSATION: normal; MOTOR FUNCTION:  moving all extremities equally. Speech is fluent/normal  Non-Invasive Vascular Imaging AAA duplex shows a maximum diameter of 4.4 AP by 4.4 transverse which is consistent with previous exam in August 2013 when he was 4.6 x 4.7   ASSESSMENT/PLAN: Asymptomatic patient will followup in 6 months with repeat AAA duplex. The patient's questions were encouraged and answered, he is in agreement with this plan.  Lauree Chandler ANP   Clinic MD: Darrick Penna

## 2012-07-28 ENCOUNTER — Ambulatory Visit (INDEPENDENT_AMBULATORY_CARE_PROVIDER_SITE_OTHER): Payer: Medicare Other | Admitting: Cardiology

## 2012-07-28 ENCOUNTER — Encounter: Payer: Self-pay | Admitting: Cardiology

## 2012-07-28 VITALS — BP 122/74 | HR 78 | Ht 74.0 in | Wt 182.8 lb

## 2012-07-28 DIAGNOSIS — M6289 Other specified disorders of muscle: Secondary | ICD-10-CM

## 2012-07-28 DIAGNOSIS — M629 Disorder of muscle, unspecified: Secondary | ICD-10-CM

## 2012-07-28 DIAGNOSIS — I10 Essential (primary) hypertension: Secondary | ICD-10-CM

## 2012-07-28 DIAGNOSIS — I714 Abdominal aortic aneurysm, without rupture, unspecified: Secondary | ICD-10-CM

## 2012-07-28 DIAGNOSIS — I25810 Atherosclerosis of coronary artery bypass graft(s) without angina pectoris: Secondary | ICD-10-CM

## 2012-07-28 DIAGNOSIS — I2510 Atherosclerotic heart disease of native coronary artery without angina pectoris: Secondary | ICD-10-CM

## 2012-07-28 NOTE — Progress Notes (Signed)
HPI:  Patient is in for follow up.  He is doing pretty well.  Denies much in the way of chest pain--perhaps occasional.  No major new symptoms.    Current Outpatient Prescriptions  Medication Sig Dispense Refill  . aspirin 81 MG tablet Take 81 mg by mouth daily.      . busPIRone (BUSPAR) 10 MG tablet Take 10 mg by mouth 2 (two) times daily as needed.      . cloNIDine (CATAPRES) 0.2 MG tablet Take 0.1 mg by mouth 2 (two) times daily.       . clopidogrel (PLAVIX) 75 MG tablet Take 75 mg by mouth daily.        . finasteride (PROSCAR) 5 MG tablet Take 5 mg by mouth daily.        Marland Kitchen HYDROcodone-acetaminophen (VICODIN) 5-500 MG per tablet Take 1 tablet by mouth every 6 (six) hours as needed. For pain      . nitroGLYCERIN (NITROSTAT) 0.4 MG SL tablet Place 0.4 mg under the tongue every 5 (five) minutes as needed. For chest pain      . pantoprazole (PROTONIX) 40 MG tablet Take 40 mg by mouth daily.       . Polyethylene Glycol 8000 POWD Take by mouth 4 (four) times a week.       . simvastatin (ZOCOR) 40 MG tablet Take 40 mg by mouth at bedtime.        Marland Kitchen spironolactone (ALDACTONE) 25 MG tablet Take 0.5 tablets (12.5 mg total) by mouth 2 (two) times daily as needed.      . zolpidem (AMBIEN) 10 MG tablet Take 10 mg by mouth at bedtime as needed. For insomnia      . lisinopril (PRINIVIL,ZESTRIL) 20 MG tablet Take 20 mg by mouth 2 (two) times daily.         No current facility-administered medications for this visit.    Allergies  Allergen Reactions  . Alum & Mag Hydroxide-Simeth Other (See Comments)    unknwn  . Oxycodone-Acetaminophen Other (See Comments)    unknown    Past Medical History  Diagnosis Date  . Other and unspecified hyperlipidemia   . Renal calculus     history  . Other specified gastritis without mention of hemorrhage   . Spinal stenosis, unspecified region other than cervical   . Hiatal hernia     HISTORY  . Acute venous embolism and thrombosis of unspecified deep vessels  of lower extremity   . Abdominal aneurysm without mention of rupture   . Esophageal reflux   . Unspecified essential hypertension   . Coronary atherosclerosis of unspecified type of vessel, native or graft   . CHF (congestive heart failure)   . Stomach pain June 2013    Hospital stay at [facility]  . Irregular heart beat   . Myocardial infarction     Past Surgical History  Procedure Laterality Date  . Ptca    . Total knee arthroplasty      right  . Joint replacement  2005    Right knee  . Spine surgery      X's 3  . Coronary artery bypass graft  1994 and  2005    Family History  Problem Relation Age of Onset  . Coronary artery disease Mother   . Diabetes Mother   . Heart disease Mother   . Hyperlipidemia Mother   . Hypertension Mother   . Heart disease Father   . Hyperlipidemia Father   .  Hypertension Father     History   Social History  . Marital Status: Married    Spouse Name: N/A    Number of Children: N/A  . Years of Education: N/A   Occupational History  . Not on file.   Social History Main Topics  . Smoking status: Never Smoker   . Smokeless tobacco: Never Used  . Alcohol Use: No  . Drug Use: No  . Sexually Active: Not on file   Other Topics Concern  . Not on file   Social History Narrative  . No narrative on file    ROS: Please see the HPI.  All other systems reviewed and negative.  PHYSICAL EXAM:  BP 122/74  Pulse 78  Ht 6\' 2"  (1.88 m)  Wt 182 lb 12.8 oz (82.918 kg)  BMI 23.46 kg/m2  SpO2 96%  General: Well developed, well nourished, in no acute distress. Head:  Normocephalic and atraumatic. Neck: no JVD Lungs: Clear to auscultation and percussion. Heart: Normal S1 and S2.  1/6 apical systolic murmur.   Abdomen:  Normal bowel sounds; soft; non tender; no organomegaly Pulses: Pulses normal in all 4 extremities. Extremities: No clubbing or cyanosis. No edema. Neurologic: Alert and oriented x 3.  EKG:  NSR.  Inferior MI, old.   Lateral  and inferior T wave inversion.  LVH .  Minor T change V4 and V5, non diagnostic.    ASSESSMENT AND PLAN:  1.  Stable 2.  FU with Dr. Clifton James 3.  No new changes.

## 2012-07-28 NOTE — Patient Instructions (Addendum)
Your physician recommends that you schedule a follow-up appointment in: 4 MONTHS with Dr Clifton James (previous pt of Dr Riley Kill)  Your physician recommends that you continue on your current medications as directed. Please refer to the Current Medication list given to you today.

## 2012-07-30 NOTE — Assessment & Plan Note (Signed)
Maintains follow up with [facility].

## 2012-07-30 NOTE — Assessment & Plan Note (Signed)
This has been somewhat problematic, but handled by his primary care provider in an effective fashion.

## 2012-07-30 NOTE — Assessment & Plan Note (Signed)
Not really symptomatic.  Would be appropriate at some point in the future to repeat echo studies.

## 2012-07-30 NOTE — Assessment & Plan Note (Signed)
Has been maintained on DAPT since the time of his redo surgery.  He has tolerated this well.

## 2012-11-28 ENCOUNTER — Ambulatory Visit (INDEPENDENT_AMBULATORY_CARE_PROVIDER_SITE_OTHER): Payer: Medicare Other | Admitting: Cardiovascular Disease

## 2012-11-28 ENCOUNTER — Encounter: Payer: Self-pay | Admitting: Cardiovascular Disease

## 2012-11-28 VITALS — BP 124/64 | HR 81 | Ht 74.0 in | Wt 176.0 lb

## 2012-11-28 DIAGNOSIS — I739 Peripheral vascular disease, unspecified: Secondary | ICD-10-CM

## 2012-11-28 DIAGNOSIS — I340 Nonrheumatic mitral (valve) insufficiency: Secondary | ICD-10-CM

## 2012-11-28 DIAGNOSIS — I714 Abdominal aortic aneurysm, without rupture, unspecified: Secondary | ICD-10-CM

## 2012-11-28 DIAGNOSIS — I2510 Atherosclerotic heart disease of native coronary artery without angina pectoris: Secondary | ICD-10-CM

## 2012-11-28 DIAGNOSIS — I059 Rheumatic mitral valve disease, unspecified: Secondary | ICD-10-CM

## 2012-11-28 NOTE — Patient Instructions (Addendum)

## 2012-11-28 NOTE — Progress Notes (Signed)
History of Present Illness: 79 yo male with history of HLD, HTN, GERD, CAD s/p CABG (1994 redo in 2005), ischemic cardiomyopathy, MR, hiatal hernia, DVT who is here today for cardiac follow up. He has been followed in the past by Dr. Riley Kill. Last echo 2011 with LVEF 35-40% with moderate MR, trivial AI. Last cath 2005 with severe native vessel disease and patency of LIMA to LAD. CABG 07/04/03 with right radial graft to PDA, SVG to Circumflex. His LIMA to LAD was patent by cath. His AAA is followed by Dr. Darrick Penna in [facility]. Last u/s 2/14 with stable 4.6 x 4.75 AAA.   He is here today for follow up. He has had no exertional chest pain. He does have exertional dyspnea which has changed some over the last year. He is dizzy all of the time. No changes. He has been told before that he had atrial fibrillation in primary care but never documented here. He has not been on anti-coagulation. No prolonged episodes of palpitations.   Primary Care Physician: Feliciana Rossetti  Last Lipid Profile:Followed in primary care.   Past Medical History  Diagnosis Date  . Other and unspecified hyperlipidemia   . Renal calculus     history  . Other specified gastritis without mention of hemorrhage   . Spinal stenosis, unspecified region other than cervical   . Hiatal hernia     HISTORY  . Acute venous embolism and thrombosis of unspecified deep vessels of lower extremity   . Abdominal aneurysm without mention of rupture   . Esophageal reflux   . Unspecified essential hypertension   . Coronary atherosclerosis of unspecified type of vessel, native or graft   . CHF (congestive heart failure)   . Stomach pain June 2013    Hospital stay at [facility]  . Irregular heart beat   . Myocardial infarction     Past Surgical History  Procedure Laterality Date  . Ptca    . Total knee arthroplasty      right  . Joint replacement  2005    Right knee  . Spine surgery      X's 3  . Coronary artery bypass graft  1994 and  2005     Current Outpatient Prescriptions  Medication Sig Dispense Refill  . aspirin 81 MG tablet Take 81 mg by mouth daily.      . busPIRone (BUSPAR) 10 MG tablet Take 10 mg by mouth 2 (two) times daily as needed.      . cloNIDine (CATAPRES) 0.2 MG tablet Take 0.1 mg by mouth 2 (two) times daily.       . clopidogrel (PLAVIX) 75 MG tablet Take 75 mg by mouth daily.        . finasteride (PROSCAR) 5 MG tablet Take 5 mg by mouth daily.        Marland Kitchen HYDROcodone-acetaminophen (VICODIN) 5-500 MG per tablet Take 1 tablet by mouth every 6 (six) hours as needed. For pain      . lisinopril (PRINIVIL,ZESTRIL) 20 MG tablet Take 20 mg by mouth 2 (two) times daily.        . nitroGLYCERIN (NITROSTAT) 0.4 MG SL tablet Place 0.4 mg under the tongue every 5 (five) minutes as needed. For chest pain      . pantoprazole (PROTONIX) 40 MG tablet Take 40 mg by mouth daily.       . Polyethylene Glycol 8000 POWD Take by mouth 4 (four) times a week.       Marland Kitchen  simvastatin (ZOCOR) 40 MG tablet Take 40 mg by mouth at bedtime.        Marland Kitchen spironolactone (ALDACTONE) 25 MG tablet Take 0.5 tablets (12.5 mg total) by mouth 2 (two) times daily as needed.      . temazepam (RESTORIL) 30 MG capsule Take 30 mg by mouth at bedtime as needed for sleep.      Marland Kitchen zolpidem (AMBIEN) 10 MG tablet Take 10 mg by mouth at bedtime as needed. For insomnia       No current facility-administered medications for this visit.    Allergies  Allergen Reactions  . Alum & Mag Hydroxide-Simeth Other (See Comments)    unknwn  . Oxycodone-Acetaminophen Other (See Comments)    unknown    History   Social History  . Marital Status: Married    Spouse Name: N/A    Number of Children: N/A  . Years of Education: N/A   Occupational History  . Not on file.   Social History Main Topics  . Smoking status: Never Smoker   . Smokeless tobacco: Never Used  . Alcohol Use: No  . Drug Use: No  . Sexually Active: Not on file   Other Topics Concern  . Not on file    Social History Narrative  . No narrative on file    Family History  Problem Relation Age of Onset  . Coronary artery disease Mother   . Diabetes Mother   . Heart disease Mother   . Hyperlipidemia Mother   . Hypertension Mother   . Heart disease Father   . Hyperlipidemia Father   . Hypertension Father     Review of Systems:  As stated in the HPI and otherwise negative.   BP 124/64  Pulse 81  Ht 6\' 2"  (1.88 m)  Wt 176 lb (79.833 kg)  BMI 22.59 kg/m2  SpO2 96%  Physical Examination: General: Well developed, well nourished, NAD HEENT: OP clear, mucus membranes moist SKIN: warm, dry. No rashes. Neuro: No focal deficits Musculoskeletal: Muscle strength 5/5 all ext Psychiatric: Mood and affect normal Neck: No JVD, no carotid bruits, no thyromegaly, no lymphadenopathy. Lungs:Clear bilaterally, no wheezes, rhonci, crackles Cardiovascular: Regular rate and rhythm. No murmurs, gallops or rubs. Abdomen:Soft. Bowel sounds present. Non-tender.  Extremities: No lower extremity edema. Pulses are 2 + in the bilateral DP/PT.  EKG: Sinus, PACs. Rate 74 bpm.   Echo 07/17/09: Left ventricle: The cavity size was mildly dilated. Wall thickness was normal. Systolic function was moderately reduced. The estimated ejection fraction was in the range of 35% to 40%. There is akinesis and scarring of the basal-mid inferolateral myocardium. All other segments were hypokinetic. The study is not technically sufficient to allow evaluation of LV diastolic function. - Aortic valve: Trivial regurgitation. - Mitral valve: Mild to moderate regurgitation. - Left atrium: The atrium was moderately dilated. - Right atrium: The atrium was moderately dilated. - Pulmonic valve: Moderate regurgitation.  Cardiac cath 2005 (pre re-do CABG): 4. The left main coronary artery demonstrates about a 75 to 80% stenosis at  the takeoff involving the origin of the circumflex. The LAD itself was  totally occluded  just after the takeoff of the small intermediate vessel,  which itself has some proximal irregularities.  5. Following the origin of a marginal branch, there is 70 and 90% narrowing  noted in the AV circumflex. There is some filling noted distally,  although this is likely competitive.  6. The internal mammary to the distal LAD is widely  patent. The distal LAD  has an 80% apical tip stenosis.  7. The saphenous vein graft to two branches of the obtuse marginal  demonstrates severe degenerative disease. There is a stent site  proximally with about 50% smooth narrowing. After this there are three  tandem stenoses of 80, 70 and 80 in an area that looks severely  degenerative. The graft then supplies a marginal branch and then circles  posteriorly to supply a second marginal. In this area is a previously  placed stent which remains widely patent.  8. The right coronary artery is totally occluded.  9. The saphenous vein graft to the PDA demonstrates multiple areas of  diffuse disease. The graft itself was fairly small caliber graft, but  there is a 90% narrowing in the mid portion at a previously stented site.  There is also 75 and 80% disease noted distally. The PDA is at least  moderate size.  Assessment and Plan:   1. ABDOMINAL AORTIC ANEURYSM: Follow up with [facility].   2. CAD: s/p CABG. Stable. Continue current meds. Has been maintained on DAPT since the time of his redo surgery.  He has tolerated this well.        3. HYPERTENSION: BP controlled. Managed in primary care.    4. Mitral regurgitation: Moderate by echo 2011. Repeat echo to assess now.       5. Irregular heart rhythm: Sinus today with PACs. He will call if he notices more palpitations and we will arrange heart monitor at that time.

## 2012-12-02 ENCOUNTER — Ambulatory Visit (HOSPITAL_COMMUNITY): Payer: Medicare Other | Attending: Cardiology | Admitting: Radiology

## 2012-12-02 DIAGNOSIS — I340 Nonrheumatic mitral (valve) insufficiency: Secondary | ICD-10-CM

## 2012-12-02 DIAGNOSIS — I059 Rheumatic mitral valve disease, unspecified: Secondary | ICD-10-CM | POA: Insufficient documentation

## 2012-12-02 DIAGNOSIS — I714 Abdominal aortic aneurysm, without rupture, unspecified: Secondary | ICD-10-CM | POA: Insufficient documentation

## 2012-12-02 DIAGNOSIS — I2589 Other forms of chronic ischemic heart disease: Secondary | ICD-10-CM | POA: Insufficient documentation

## 2012-12-02 DIAGNOSIS — I739 Peripheral vascular disease, unspecified: Secondary | ICD-10-CM | POA: Insufficient documentation

## 2012-12-02 DIAGNOSIS — R002 Palpitations: Secondary | ICD-10-CM | POA: Insufficient documentation

## 2012-12-02 DIAGNOSIS — I10 Essential (primary) hypertension: Secondary | ICD-10-CM | POA: Insufficient documentation

## 2012-12-02 DIAGNOSIS — E785 Hyperlipidemia, unspecified: Secondary | ICD-10-CM | POA: Insufficient documentation

## 2012-12-02 NOTE — Progress Notes (Signed)
Echocardiogram performed.  

## 2013-01-12 ENCOUNTER — Ambulatory Visit: Payer: Medicare Other | Admitting: Neurosurgery

## 2013-01-13 ENCOUNTER — Encounter (INDEPENDENT_AMBULATORY_CARE_PROVIDER_SITE_OTHER): Payer: Medicare Other | Admitting: *Deleted

## 2013-01-13 DIAGNOSIS — I714 Abdominal aortic aneurysm, without rupture: Secondary | ICD-10-CM

## 2013-01-27 ENCOUNTER — Other Ambulatory Visit: Payer: Self-pay | Admitting: *Deleted

## 2013-01-27 ENCOUNTER — Encounter: Payer: Self-pay | Admitting: Vascular Surgery

## 2013-01-27 DIAGNOSIS — I714 Abdominal aortic aneurysm, without rupture: Secondary | ICD-10-CM

## 2013-03-28 ENCOUNTER — Telehealth: Payer: Self-pay | Admitting: Cardiovascular Disease

## 2013-03-28 NOTE — Telephone Encounter (Signed)
New message     Pt c/o sob---which is new for him--  Per triage---sent to nurse for callback.

## 2013-03-28 NOTE — Telephone Encounter (Signed)
Thanks

## 2013-03-28 NOTE — Telephone Encounter (Signed)
Spoke with pt. He reports shortness of breath with minimal exertion. Gets short of breath when walking from storage building in yard to house--about 50 feet. Goes away with rest.  Will sometimes have shortness of breath at rest also.  Shortness of breath has gotten worse in last week. Had some exertional shortness of breath when he last saw Dr. Clifton James but pt feels it is continuing to worsen. States he had endo recently and will have pain in chest area with swallowing at times. Also has chest pain not associated with swallowing at times. Happens every couple of days. Lasts a few seconds and goes away on own. No palpitations. Appt made for pt to see Jacolyn Reedy, PA on March 29, 2013 at 1:45.

## 2013-03-29 ENCOUNTER — Ambulatory Visit (INDEPENDENT_AMBULATORY_CARE_PROVIDER_SITE_OTHER): Payer: Medicare Other | Admitting: Physician Assistant

## 2013-03-29 ENCOUNTER — Encounter: Payer: Self-pay | Admitting: Physician Assistant

## 2013-03-29 VITALS — BP 160/90 | HR 76 | Ht 74.0 in | Wt 185.0 lb

## 2013-03-29 DIAGNOSIS — R0789 Other chest pain: Secondary | ICD-10-CM

## 2013-03-29 DIAGNOSIS — R0609 Other forms of dyspnea: Secondary | ICD-10-CM | POA: Insufficient documentation

## 2013-03-29 NOTE — Assessment & Plan Note (Signed)
Stable

## 2013-03-29 NOTE — Assessment & Plan Note (Signed)
Patient has new symptoms of dyspnea on exertion that could be anginal equivalent. We will order a Lexiscan to rule out ischemia

## 2013-03-29 NOTE — Assessment & Plan Note (Signed)
Patient has 2-3 week history of worsening dyspnea on exertion. There is no evidence of heart failure on exam today. O2 sats were 97% with walking but his heart rate did jump up to 120 beats per minute. We will place a 48-hour Holter and order a Lexi scan to rule out ischemia. I discussed this patient in detail with Dr.McAlhany who concurs. If his Lexi scan is normal we may need to pursue CT angio of his chest. I recommend the patient have the tests done this week, but he has family coming to town tomorrow and says he won't do anything until next week.

## 2013-03-29 NOTE — Patient Instructions (Signed)
Your physician has requested that you have a lexiscan myoview NO LATER THEN Tuesday April 04, 2013. Please follow instruction sheet, as given.  Your physician has recommended that you wear a 48 HOUR holter monitor NO LATER THEN Tuesday April 04, 2013. Holter monitors are medical devices that record the heart's electrical activity. Doctors most often use these monitors to diagnose arrhythmias. Arrhythmias are problems with the speed or rhythm of the heartbeat. The monitor is a small, portable device. You can wear one while you do your normal daily activities. This is usually used to diagnose what is causing palpitations/syncope (passing out).  Your physician recommends that you continue on your current medications as directed. Please refer to the Current Medication list given to you today.

## 2013-03-29 NOTE — Assessment & Plan Note (Signed)
Stable on recent ultrasound 4.6 x 4.8 cm

## 2013-03-29 NOTE — Progress Notes (Signed)
HPI:   This is an 79-year-old male patient who has history of coronary artery disease status post CABG in 1994 with redo in 2005, ischemic cardiomyopathy echo in 2011 EF 35-40% with moderate MR, trivial AI. Recent echo 11/2012 EF 45-50%. Last cath in 2005 showed severe native vessel disease and patent LIMA to the LAD. CABG in 2005 with right radial graft to the PDA, SVG to the circumflex. His LIMA to the LAD was patent by cath. His abdominal aortic aneurysm is followed by Dr. Fields. Last ultrasound 06/2102, 4.6 x 4.8  He also has history of DVT, hypertension, hyperlipidemia and GERD. He's also been told on 3 different occasions that he had atrial fibrillation but we've never documented it in this office.  He comes in today complaining of 2-3 week history of significantly worsening dyspnea on exertion. He was exercising at the gym on a regular basis until 3 months ago when he had to stop because of chronic back pain. He's had occasional chest pressure but it is not associated with exertion and  it eases spontaneously within minutes. He denies radiation of pain, dyspnea, diaphoresis. He's also had a recent endoscopy and has had trouble swallowing and has associated pressure with eating. He denies any edema, dyspnea at rest, palpitations. He has chronic dizziness and balance issues that has not changed. We walked him with an O2 sat monitor on.His O2 sat was 97% at rest and 97% with exercise even though he was symptomatically short of breath.   Allergies:  -- Alum & Mag Hydroxide-Simeth -- Other (See Comments)   --  unknwn  -- Oxycodone-Acetaminophen -- Other (See Comments)   --  unknown  Current Outpatient Prescriptions on File Prior to Visit: aspirin 81 MG tablet, Take 81 mg by mouth daily., Disp: , Rfl:  busPIRone (BUSPAR) 10 MG tablet, Take 10 mg by mouth 2 (two) times daily as needed., Disp: , Rfl:  cloNIDine (CATAPRES) 0.2 MG tablet, Take 0.1 mg by mouth 2 (two) times daily. , Disp: , Rfl:   clopidogrel (PLAVIX) 75 MG tablet, Take 75 mg by mouth daily.  , Disp: , Rfl:  finasteride (PROSCAR) 5 MG tablet, Take 5 mg by mouth daily.  , Disp: , Rfl:  HYDROcodone-acetaminophen (VICODIN) 5-500 MG per tablet, Take 1 tablet by mouth every 6 (six) hours as needed. For pain, Disp: , Rfl:  lisinopril (PRINIVIL,ZESTRIL) 20 MG tablet, Take 20 mg by mouth 2 (two) times daily.  , Disp: , Rfl:  nitroGLYCERIN (NITROSTAT) 0.4 MG SL tablet, Place 0.4 mg under the tongue every 5 (five) minutes as needed. For chest pain, Disp: , Rfl:  pantoprazole (PROTONIX) 40 MG tablet, Take 40 mg by mouth daily. , Disp: , Rfl:  Polyethylene Glycol 8000 POWD, Take by mouth 4 (four) times a week. , Disp: , Rfl:  simvastatin (ZOCOR) 40 MG tablet, Take 40 mg by mouth at bedtime.  , Disp: , Rfl:  spironolactone (ALDACTONE) 25 MG tablet, Take 0.5 tablets (12.5 mg total) by mouth 2 (two) times daily as needed., Disp: , Rfl:  temazepam (RESTORIL) 30 MG capsule, Take 30 mg by mouth at bedtime as needed for sleep., Disp: , Rfl:  zolpidem (AMBIEN) 10 MG tablet, Take 10 mg by mouth at bedtime as needed. For insomnia, Disp: , Rfl:   No current facility-administered medications on file prior to visit.   Past Medical History:   Other and unspecified hyperlipidemia                           Renal calculus                                                 Comment:history   Other specified gastritis without mention of h*              Spinal stenosis, unspecified region other than*              Hiatal hernia                                                  Comment:HISTORY   Acute venous embolism and thrombosis of unspec*              Abdominal aneurysm without mention of rupture                Esophageal reflux                                            Unspecified essential hypertension                           Coronary atherosclerosis of unspecified type o*              CHF (congestive heart failure)                                Stomach pain                                    June 2013      Comment:Hospital stay at [facility]   Irregular heart beat                                         Myocardial infarction                                       Past Surgical History:   PTCA                                                          TOTAL KNEE ARTHROPLASTY                                         Comment:right   JOINT REPLACEMENT                                2005             Comment:Right knee   SPINE SURGERY                                                   Comment:X's 3   CORONARY ARTERY BYPASS GRAFT                     1994 and *  Review of patient's family history indicates:   Coronary artery disease        Mother                   Diabetes                       Mother                   Heart disease                  Mother                   Hyperlipidemia                 Mother                   Hypertension                   Mother                   Heart disease                  Father                   Hyperlipidemia                 Father                   Hypertension                   Father                   Social History   Marital Status: Married             Spouse Name:                      Years of Education:                 Number of children:             Occupational History   None on file  Social History Main Topics   Smoking Status: Never Smoker                     Smokeless Status: Never Used                       Alcohol Use: No             Drug Use: No             Sexual Activity: Not on file        Other Topics            Concern   None on file  Social History Narrative   None on   file    ROS: chronic left calf pain since having back problems.   PHYSICAL EXAM: Well-nournished, in no acute distress. Neck: No JVD, HJR, Bruit, or thyroid enlargement  Lungs: No tachypnea, clear without wheezing, rales, or rhonchi  Cardiovascular: RRR, PMI not displaced, 1/6 systolic  murmur at the left sternal border, no bruit, thrill, or heave.  Abdomen: BS normal. Soft without organomegaly, masses, lesions or tenderness.  Extremities: without cyanosis, clubbing or edema. Decreased distal pulses bilateral. No pain in extremities with palpation  SKin: Warm, no lesions or rashes   Musculoskeletal: No deformities  Neuro: no focal signs  There were no vitals taken for this visit.   EKG: Normal sinus rhythm with PACs, LVH  Echo 12/02/12: Study Conclusions  - Left ventricle: Moderately severe hypokinesis of   apical-septal segment and the inferior wall. The cavity   size was mildly dilated. Wall thickness was normal.   Systolic function was mildly reduced. The estimated   ejection fraction was in the range of 45% to 50%. - Left atrium: The atrium was mildly dilated.    Cardiac cath 2005 (pre re-do CABG): 4. The left main coronary artery demonstrates about a 75 to 80% stenosis at   the takeoff involving the origin of the circumflex. The LAD itself was   totally occluded just after the takeoff of the small intermediate vessel,   which itself has some proximal irregularities.   5. Following the origin of a marginal branch, there is 70 and 90% narrowing   noted in the AV circumflex. There is some filling noted distally,   although this is likely competitive.   6. The internal mammary to the distal LAD is widely patent. The distal LAD   has an 80% apical tip stenosis.   7. The saphenous vein graft to two branches of the obtuse marginal   demonstrates severe degenerative disease. There is a stent site   proximally with about 50% smooth narrowing. After this there are three   tandem stenoses of 80, 70 and 80 in an area that looks severely   degenerative. The graft then supplies a marginal branch and then circles   posteriorly to supply a second marginal. In this area is a previously   placed stent which remains widely patent.   8. The right coronary artery is  totally occluded.   9. The saphenous vein graft to the PDA demonstrates multiple areas of   diffuse disease. The graft itself was fairly small caliber graft, but   there is a 90% narrowing in the mid portion at a previously stented site.   There is also 75 and 80% disease noted distally. The PDA is at least   moderate size.    

## 2013-04-03 ENCOUNTER — Encounter: Payer: Self-pay | Admitting: Cardiology

## 2013-04-04 ENCOUNTER — Encounter: Payer: Self-pay | Admitting: *Deleted

## 2013-04-04 ENCOUNTER — Encounter (INDEPENDENT_AMBULATORY_CARE_PROVIDER_SITE_OTHER): Payer: Medicare Other

## 2013-04-04 ENCOUNTER — Other Ambulatory Visit: Payer: Self-pay

## 2013-04-04 ENCOUNTER — Ambulatory Visit (HOSPITAL_COMMUNITY): Payer: Medicare Other | Attending: Cardiology | Admitting: Radiology

## 2013-04-04 ENCOUNTER — Encounter: Payer: Self-pay | Admitting: Cardiology

## 2013-04-04 VITALS — BP 177/116 | Ht 74.0 in | Wt 182.0 lb

## 2013-04-04 DIAGNOSIS — R0789 Other chest pain: Secondary | ICD-10-CM

## 2013-04-04 DIAGNOSIS — R55 Syncope and collapse: Secondary | ICD-10-CM | POA: Insufficient documentation

## 2013-04-04 DIAGNOSIS — I491 Atrial premature depolarization: Secondary | ICD-10-CM

## 2013-04-04 DIAGNOSIS — R42 Dizziness and giddiness: Secondary | ICD-10-CM | POA: Insufficient documentation

## 2013-04-04 DIAGNOSIS — R0989 Other specified symptoms and signs involving the circulatory and respiratory systems: Secondary | ICD-10-CM | POA: Insufficient documentation

## 2013-04-04 DIAGNOSIS — E785 Hyperlipidemia, unspecified: Secondary | ICD-10-CM | POA: Insufficient documentation

## 2013-04-04 DIAGNOSIS — I4949 Other premature depolarization: Secondary | ICD-10-CM

## 2013-04-04 DIAGNOSIS — I739 Peripheral vascular disease, unspecified: Secondary | ICD-10-CM | POA: Insufficient documentation

## 2013-04-04 DIAGNOSIS — Z8249 Family history of ischemic heart disease and other diseases of the circulatory system: Secondary | ICD-10-CM | POA: Insufficient documentation

## 2013-04-04 DIAGNOSIS — R079 Chest pain, unspecified: Secondary | ICD-10-CM

## 2013-04-04 DIAGNOSIS — I2510 Atherosclerotic heart disease of native coronary artery without angina pectoris: Secondary | ICD-10-CM

## 2013-04-04 DIAGNOSIS — I10 Essential (primary) hypertension: Secondary | ICD-10-CM | POA: Insufficient documentation

## 2013-04-04 DIAGNOSIS — R0602 Shortness of breath: Secondary | ICD-10-CM

## 2013-04-04 DIAGNOSIS — R0609 Other forms of dyspnea: Secondary | ICD-10-CM | POA: Insufficient documentation

## 2013-04-04 MED ORDER — TECHNETIUM TC 99M SESTAMIBI GENERIC - CARDIOLITE
11.0000 | Freq: Once | INTRAVENOUS | Status: AC | PRN
  Administered 2013-04-04: 11 via INTRAVENOUS

## 2013-04-04 MED ORDER — TECHNETIUM TC 99M SESTAMIBI GENERIC - CARDIOLITE
33.0000 | Freq: Once | INTRAVENOUS | Status: AC | PRN
  Administered 2013-04-04: 33 via INTRAVENOUS

## 2013-04-04 MED ORDER — REGADENOSON 0.4 MG/5ML IV SOLN
0.4000 mg | Freq: Once | INTRAVENOUS | Status: AC
  Administered 2013-04-04: 0.4 mg via INTRAVENOUS

## 2013-04-04 NOTE — Progress Notes (Signed)
Patient ID: Jonathan Arroyo, male   DOB: 01-19-1930, 79 y.o.   MRN: [id number] E-Cardio 48 hour holter monitor applied to patient.

## 2013-04-04 NOTE — Progress Notes (Signed)
[facility] SITE 3 NUCLEAR MED [address] [place], [place] [address] [phone]    Cardiology Nuclear Med Study  Jonathan Arroyo is a 79 y.o. male     MRN : [id number]     DOB: 1929-08-09  Procedure Date: 04/04/2013  Nuclear Med Background Indication for Stress Test:  Evaluation for Ischemia, Graft Patency and Stent Patency History:  '09 MPI:EF=41%,prior infarct inferior,mild peri-infarct ischemia,low risk;'14 Echo:EF-45% Cardiac Risk Factors: Family History - CAD, Hypertension, Lipids and PVD  Symptoms:  Chest Pain at Rest and with Exertion (last date of chest discomfort this am), Dizziness, DOE, Near Syncope and SOB   Nuclear Pre-Procedure Caffeine/Decaff Intake:  None > 12 hrs NPO After: 7:00am   Lungs:  clear O2 Sat: 98% on room air. IV 0.9% NS with Angio Cath:  20g  IV Site: L Wrist x 1, tolerated well IV Started by:  Irean Hong, RN  Chest Size (in):  42 Cup Size: n/a  Height: 6\' 2"  (1.88 m)  Weight:  182 lb (82.555 kg)  BMI:  Body mass index is 23.36 kg/(m^2). Tech Comments:  Took am medications    Nuclear Med Study 1 or 2 day study: 1 day  Stress Test Type:  Lexiscan  Reading MD: Olga Millers, MD  Order Authorizing Provider:  Verne Carrow, MD, and Jacolyn Reedy, [facility]  Resting Radionuclide: Technetium 68m Sestamibi  Resting Radionuclide Dose: 10.5 mCi   Stress Radionuclide:  Technetium 86m Sestamibi  Stress Radionuclide Dose: 33.0 mCi           Stress Protocol Rest HR: 85 Stress HR: 98  Rest BP: 177/116 Stress BP: 166/107  Exercise Time (min): n/a METS: n/a   Predicted Max HR: 139 bpm % Max HR: 70.5 bpm Rate Pressure Product: 62952   Dose of Adenosine (mg):  n/a Dose of Lexiscan: 0.4 mg  Dose of Atropine (mg): n/a Dose of Dobutamine: n/a mcg/kg/min (at max HR)  Stress Test Technologist: Cathlyn Parsons, RN  Nuclear Technologist:  Dario Guardian, CNMT     Rest Procedure:  Myocardial perfusion imaging was performed at rest 45  minutes following the intravenous administration of Technetium 86m Sestamibi. Rest ECG: Sinus rhythm with PACs and PVCs, LVH, cannot R/O prior anterior MI, prior inferior MI.  Stress Procedure:  The patient received IV Lexiscan 0.4 mg over 15-seconds.  Technetium 1m Sestamibi injected at 30-seconds.  Quantitative spect images were obtained after a 45 minute delay. Stress ECG: No significant ST segment change suggestive of ischemia.  QPS Raw Data Images:  Acquisition technically good; LVE. Stress Images:  There is decreased uptake in the inferior lateral wall and apex. Rest Images:  There is decreased uptake in the inferior lateral wall and apex, less prominent compared to the stress images. Subtraction (SDS):  These findings are consistent with prior infarct and mild peri-infarct ischemia. Transient Ischemic Dilatation (Normal <1.22):  0.98 Lung/Heart Ratio (Normal <0.45):  0.31  Quantitative Gated Spect Images QGS EDV:  n/a ml QGS ESV:  n/a ml  Impression Exercise Capacity:  Lexiscan with no exercise. BP Response:  Normal blood pressure response. Clinical Symptoms:  There is dyspnea and chest pain. ECG Impression:  No significant ST segment change suggestive of ischemia. Comparison with Prior Nuclear Study: Compared to 07/06/07, mild peri-infarct ischemia is new.  Overall Impression:  Low risk stress nuclear study with a large, severe, partially reversible inferior lateral and apical defect consistent with prior infarct and mild peri-infarct ischemia.  LV Ejection Fraction: LV  Wall Motion:  Not gated.  Olga Millers

## 2013-04-05 ENCOUNTER — Telehealth: Payer: Self-pay | Admitting: *Deleted

## 2013-04-05 ENCOUNTER — Encounter: Payer: Self-pay | Admitting: *Deleted

## 2013-04-05 DIAGNOSIS — I2510 Atherosclerotic heart disease of native coronary artery without angina pectoris: Secondary | ICD-10-CM

## 2013-04-05 NOTE — Telephone Encounter (Signed)
Dr. Clifton James reviewed stress test results with pt earlier today and cath was recommended.  I spoke with pt and he would like to proceed. Cath set up for 12:00 on April 10, 2013. Pt will have lab work done on April 06, 2013 when here to return monitor. I verbally went over all cath instructions with pt and he verbalized understanding. Will leave printed copy of instructions at front desk for pt to pick up tomorrow.

## 2013-04-06 ENCOUNTER — Other Ambulatory Visit (INDEPENDENT_AMBULATORY_CARE_PROVIDER_SITE_OTHER): Payer: Medicare Other

## 2013-04-06 DIAGNOSIS — I2510 Atherosclerotic heart disease of native coronary artery without angina pectoris: Secondary | ICD-10-CM

## 2013-04-06 LAB — CBC WITH DIFFERENTIAL/PLATELET
Basophils Absolute: 0 10*3/uL (ref 0.0–0.1)
HCT: 40.6 % (ref 39.0–52.0)
Hemoglobin: 14 g/dL (ref 13.0–17.0)
Lymphs Abs: 1.7 10*3/uL (ref 0.7–4.0)
MCV: 93.4 fl (ref 78.0–100.0)
Monocytes Absolute: 0.6 10*3/uL (ref 0.1–1.0)
Monocytes Relative: 9.3 % (ref 3.0–12.0)
Neutro Abs: 4.4 10*3/uL (ref 1.4–7.7)
Platelets: 202 10*3/uL (ref 150.0–400.0)
RDW: 13.7 % (ref 11.5–14.6)

## 2013-04-06 LAB — BASIC METABOLIC PANEL
CO2: 27 mEq/L (ref 19–32)
Calcium: 9.5 mg/dL (ref 8.4–10.5)
Creatinine, Ser: 1 mg/dL (ref 0.4–1.5)
GFR: 76.18 mL/min (ref >60.00)
Glucose, Bld: 83 mg/dL (ref 70–99)

## 2013-04-06 LAB — PROTIME-INR: INR: 1.1 ratio — ABNORMAL HIGH (ref 0.8–1.0)

## 2013-04-06 NOTE — Telephone Encounter (Signed)
I spoke to the pt and all questions answered. Proceed with cath Monday. Labs today. cdm

## 2013-04-07 NOTE — Addendum Note (Signed)
Addended by: Verne Carrow D on: 04/07/2013 02:48 PM   Modules accepted: Orders

## 2013-04-10 ENCOUNTER — Ambulatory Visit (HOSPITAL_COMMUNITY)
Admission: RE | Admit: 2013-04-10 | Discharge: 2013-04-11 | Disposition: A | Discharge status: Home / Self Care | Payer: Medicare Other | Source: Ambulatory Visit | Attending: Cardiovascular Disease | Admitting: Cardiovascular Disease

## 2013-04-10 ENCOUNTER — Encounter (HOSPITAL_COMMUNITY): Payer: Self-pay | Admitting: General Practice

## 2013-04-10 ENCOUNTER — Encounter (HOSPITAL_COMMUNITY)
Admission: RE | Disposition: A | Discharge status: Home / Self Care | Payer: Medicare Other | Source: Ambulatory Visit | Attending: Cardiovascular Disease

## 2013-04-10 DIAGNOSIS — I10 Essential (primary) hypertension: Secondary | ICD-10-CM | POA: Diagnosis present

## 2013-04-10 DIAGNOSIS — I200 Unstable angina: Secondary | ICD-10-CM | POA: Diagnosis present

## 2013-04-10 DIAGNOSIS — Z951 Presence of aortocoronary bypass graft: Secondary | ICD-10-CM | POA: Insufficient documentation

## 2013-04-10 DIAGNOSIS — I714 Abdominal aortic aneurysm, without rupture, unspecified: Secondary | ICD-10-CM | POA: Insufficient documentation

## 2013-04-10 DIAGNOSIS — I2510 Atherosclerotic heart disease of native coronary artery without angina pectoris: Secondary | ICD-10-CM

## 2013-04-10 DIAGNOSIS — K2970 Gastritis, unspecified, without bleeding: Secondary | ICD-10-CM | POA: Insufficient documentation

## 2013-04-10 DIAGNOSIS — K449 Diaphragmatic hernia without obstruction or gangrene: Secondary | ICD-10-CM | POA: Insufficient documentation

## 2013-04-10 DIAGNOSIS — E785 Hyperlipidemia, unspecified: Secondary | ICD-10-CM | POA: Insufficient documentation

## 2013-04-10 DIAGNOSIS — I509 Heart failure, unspecified: Secondary | ICD-10-CM | POA: Insufficient documentation

## 2013-04-10 DIAGNOSIS — I209 Angina pectoris, unspecified: Secondary | ICD-10-CM | POA: Insufficient documentation

## 2013-04-10 DIAGNOSIS — Z86718 Personal history of other venous thrombosis and embolism: Secondary | ICD-10-CM | POA: Insufficient documentation

## 2013-04-10 DIAGNOSIS — R0789 Other chest pain: Secondary | ICD-10-CM

## 2013-04-10 DIAGNOSIS — K219 Gastro-esophageal reflux disease without esophagitis: Secondary | ICD-10-CM | POA: Insufficient documentation

## 2013-04-10 DIAGNOSIS — I255 Ischemic cardiomyopathy: Secondary | ICD-10-CM

## 2013-04-10 DIAGNOSIS — R42 Dizziness and giddiness: Secondary | ICD-10-CM | POA: Insufficient documentation

## 2013-04-10 DIAGNOSIS — N200 Calculus of kidney: Secondary | ICD-10-CM | POA: Insufficient documentation

## 2013-04-10 DIAGNOSIS — M4800 Spinal stenosis, site unspecified: Secondary | ICD-10-CM | POA: Insufficient documentation

## 2013-04-10 HISTORY — DX: Atherosclerotic heart disease of native coronary artery without angina pectoris: I25.10

## 2013-04-10 HISTORY — DX: Ischemic cardiomyopathy: I25.5

## 2013-04-10 HISTORY — PX: CORONARY ANGIOPLASTY WITH STENT PLACEMENT: SHX49

## 2013-04-10 HISTORY — DX: Essential (primary) hypertension: I10

## 2013-04-10 HISTORY — DX: Hyperlipidemia, unspecified: E78.5

## 2013-04-10 HISTORY — DX: Anxiety disorder, unspecified: F41.9

## 2013-04-10 HISTORY — PX: LEFT HEART CATHETERIZATION WITH CORONARY ANGIOGRAM: SHX5451

## 2013-04-10 LAB — POCT ACTIVATED CLOTTING TIME: Activated Clotting Time: 365 seconds

## 2013-04-10 SURGERY — LEFT HEART CATHETERIZATION WITH CORONARY ANGIOGRAM
Anesthesia: LOCAL

## 2013-04-10 MED ORDER — ASPIRIN 81 MG PO CHEW
81.0000 mg | CHEWABLE_TABLET | ORAL | Status: AC
  Administered 2013-04-10: 81 mg via ORAL

## 2013-04-10 MED ORDER — ONDANSETRON HCL 4 MG/2ML IJ SOLN: 4.0000 mg | Freq: Four times a day (QID) | INTRAMUSCULAR | Status: DC | PRN

## 2013-04-10 MED ORDER — BUSPIRONE HCL 10 MG PO TABS
10.0000 mg | ORAL_TABLET | Freq: Two times a day (BID) | ORAL | Status: DC | PRN
  Filled 2013-04-10: qty 1

## 2013-04-10 MED ORDER — SODIUM CHLORIDE 0.9 % IV SOLN: INTRAVENOUS | Status: AC

## 2013-04-10 MED ORDER — NITROGLYCERIN 0.2 MG/ML ON CALL CATH LAB
INTRAVENOUS | Status: AC
  Filled 2013-04-10: qty 1

## 2013-04-10 MED ORDER — FENTANYL CITRATE 0.05 MG/ML IJ SOLN
INTRAMUSCULAR | Status: AC
  Filled 2013-04-10: qty 2

## 2013-04-10 MED ORDER — ASPIRIN 81 MG PO CHEW
CHEWABLE_TABLET | ORAL | Status: AC
  Filled 2013-04-10: qty 1

## 2013-04-10 MED ORDER — POLYETHYLENE GLYCOL 3350 17 G PO PACK
17.0000 g | PACK | Freq: Every day | ORAL | Status: DC | PRN
  Filled 2013-04-10: qty 1

## 2013-04-10 MED ORDER — DIAZEPAM 5 MG PO TABS
ORAL_TABLET | ORAL | Status: AC
  Filled 2013-04-10: qty 1

## 2013-04-10 MED ORDER — CLONIDINE HCL 0.1 MG PO TABS
0.1000 mg | ORAL_TABLET | Freq: Two times a day (BID) | ORAL | Status: DC
  Administered 2013-04-10 – 2013-04-11 (×2): 0.1 mg via ORAL
  Filled 2013-04-10 (×3): qty 1

## 2013-04-10 MED ORDER — SODIUM CHLORIDE 0.9 % IV SOLN
INTRAVENOUS | Status: DC
  Administered 2013-04-10: 10:00:00 via INTRAVENOUS

## 2013-04-10 MED ORDER — CLOPIDOGREL BISULFATE 75 MG PO TABS
75.0000 mg | ORAL_TABLET | Freq: Every day | ORAL | Status: DC
  Administered 2013-04-11: 75 mg via ORAL
  Filled 2013-04-10: qty 1

## 2013-04-10 MED ORDER — HEPARIN (PORCINE) IN NACL 2-0.9 UNIT/ML-% IJ SOLN
INTRAMUSCULAR | Status: AC
  Filled 2013-04-10: qty 1500

## 2013-04-10 MED ORDER — SIMVASTATIN 40 MG PO TABS
40.0000 mg | ORAL_TABLET | Freq: Every day | ORAL | Status: DC
  Administered 2013-04-10: 40 mg via ORAL
  Filled 2013-04-10 (×2): qty 1

## 2013-04-10 MED ORDER — SODIUM CHLORIDE 0.9 % IJ SOLN: 3.0000 mL | Freq: Two times a day (BID) | INTRAMUSCULAR | Status: DC

## 2013-04-10 MED ORDER — SODIUM CHLORIDE 0.9 % IJ SOLN: 3.0000 mL | INTRAMUSCULAR | Status: DC | PRN

## 2013-04-10 MED ORDER — ZOLPIDEM TARTRATE 5 MG PO TABS
5.0000 mg | ORAL_TABLET | Freq: Every evening | ORAL | Status: DC | PRN
  Administered 2013-04-10: 5 mg via ORAL
  Filled 2013-04-10: qty 1

## 2013-04-10 MED ORDER — FINASTERIDE 5 MG PO TABS
5.0000 mg | ORAL_TABLET | Freq: Every day | ORAL | Status: DC
  Filled 2013-04-10: qty 1

## 2013-04-10 MED ORDER — LISINOPRIL 20 MG PO TABS
20.0000 mg | ORAL_TABLET | Freq: Two times a day (BID) | ORAL | Status: DC
  Administered 2013-04-10 – 2013-04-11 (×2): 20 mg via ORAL
  Filled 2013-04-10 (×3): qty 1

## 2013-04-10 MED ORDER — BIVALIRUDIN 250 MG IV SOLR
INTRAVENOUS | Status: AC
  Filled 2013-04-10: qty 250

## 2013-04-10 MED ORDER — DIAZEPAM 5 MG PO TABS
5.0000 mg | ORAL_TABLET | ORAL | Status: AC
  Administered 2013-04-10: 5 mg via ORAL

## 2013-04-10 MED ORDER — PANTOPRAZOLE SODIUM 40 MG PO TBEC
40.0000 mg | DELAYED_RELEASE_TABLET | Freq: Every day | ORAL | Status: DC
  Administered 2013-04-11: 11:00:00 40 mg via ORAL
  Filled 2013-04-10: qty 1

## 2013-04-10 MED ORDER — MIDAZOLAM HCL 2 MG/2ML IJ SOLN
INTRAMUSCULAR | Status: AC
  Filled 2013-04-10: qty 2

## 2013-04-10 MED ORDER — ASPIRIN 81 MG PO CHEW
81.0000 mg | CHEWABLE_TABLET | Freq: Every day | ORAL | Status: DC
Start: 2013-04-11 — End: 2013-04-11
  Administered 2013-04-11: 10:00:00 81 mg via ORAL
  Filled 2013-04-10: qty 1

## 2013-04-10 MED ORDER — LIDOCAINE HCL (PF) 1 % IJ SOLN
INTRAMUSCULAR | Status: AC
  Filled 2013-04-10: qty 30

## 2013-04-10 MED ORDER — SODIUM CHLORIDE 0.9 % IV SOLN: 250.0000 mL | INTRAVENOUS | Status: DC | PRN

## 2013-04-10 NOTE — Progress Notes (Signed)
1645 repositioned pt post urination, noted hematoma developing on  rt groin, steady pressure applied for 10 mins. Hematoma resolved. Site remain with large ecchymosis,soft on palpation,  V?S stable. Observed rt groin site closely, bedrest emphasized to pt.

## 2013-04-10 NOTE — CV Procedure (Signed)
Cardiac Catheterization Operative Report  TELVIN REINDERS [id number] 11/24/20142:00 PM Feliciana Rossetti, MD  Procedure Performed:  1. Left Heart Catheterization 2. Selective Coronary Angiography 3. SVG/free radial artery graft angiography 4. LIMA graft angiography 5. Left ventricular angiogram 6. PTCA/DES x 1 distal left main artery 7. PTCA/DES x 1 distal Circumflex 8. Angioseal right femoral artery  Operator: Verne Carrow, MD  Indication: 79 yo male with history of CAD s/p CABG in 1996, redo CABG 2005 with free radial artery graft to the PDA, SVG to OM, known to have patent LIMA graft from first bypass procedure. Recent dyspnea and chest pain c/w unstable angina.                                    Procedure Details: The risks, benefits, complications, treatment options, and expected outcomes were discussed with the patient. The patient and/or family concurred with the proposed plan, giving informed consent. The patient was brought to the cath lab after IV hydration was begun and oral premedication was given. The patient was further sedated with Versed and Fentanyl. The right groin was prepped and draped in the usual manner. Using the modified Seldinger access technique, a 5 French sheath was placed in the right femoral artery. Standard diagnostic catheters were used to perform selective coronary angiography. The RCB was used to engage the radial artery graft to the PDA. LCB catheter used to engage both left sided vein grafts. JR4 used to engage the LIMA graft. A pigtail catheter was used to perform a left ventricular angiogram. I elected to proceed with PCI of the native distal left main and distal Circumflex arteries.   PCI Note: The sheath was upsized to a 6 [place] system. The patient was given a weight based bolus of Angiomax and a drip was started. When the ACT was over 200, I engaged the left main with a XB 3.0 guide. I then passed a Cougar IC wire down the Circumflex artery  into the distal vessel. A 2.5 x 15 mm balloon was used to pre-dilate the distal left main stenosis and then the distal Circumflex into the second OM branch. A 2.75 x 20 mm Promus Premier DES was deployed in the distal Circumflex extending into the second OM branch. This was post-dilated with a 2.75 x 15 mm [place] balloon. I then focused on lesion #2. I pulled the 2.75 x 15 [place] balloon into the distal left main and pre-dilated this lesion again. I then carefully positioned and deployed a 3.0 x 16 mm Promus Premier DES in the distal left main extending into the proximal Circumflex. This stent was post-dilated with a 3.5 x 8 mm [place]row balloon x 2. There was an excellent angiographic result. The guide was removed.   An Angioseal femoral artery closure device was placed in the right femoral artery.   There were no immediate complications. The patient was taken to the recovery area in stable condition.   Hemodynamic Findings: Central aortic pressure: 170/76 Left ventricular pressure: 167/11/19  Angiographic Findings:  Left main: 99% distal stenosis.   Left Anterior Descending Artery: Large caliber vessel that courses to the apex. The vessel is occluded at the ostium. The entire proximal, mid and distal vessel including the diagonal fills from the patent IMA graft.   Circumflex Artery: Large caliber, diffusely calcified vessel. 99% ostial stenosis. 40% proximal stenosis. First obtuse marginal branch is moderate in caliber with a  proximal 30% stenosis. The distal AV groove Circumflex has a long 99% stenosis just before the takeoff of the second obtuse marginal branch. The distal AV groove Circumflex beyond this becomes small in caliber and has diffuse 50% stensosis.   Right Coronary Artery: 100% proximal occlusion. The mid and distal vessel fills from the patent free radial graft.   Graft Anatomy:  Both SVG to OM system are occluded Free radial graft to PDA is patent LIMA graft to mid LAD is patent  Left  Ventricular Angiogram: LVEF=35%. Global hypokinesis.   Impression: 1. Severe triple vessel CAD s/p 3V CABG with 2/3 patent bypass grafts.  2. Occluded vein graft to OM system with high grade disease in distal left main leading into large OM system and severe stenosis distal Circumflex.  3. Successful PTCA/DES x 1 distal Left main artery 4. Successful PTCA/DES x 1 distal left Circumflex artery 5. Moderate LV systolic dysfunction  Recommendations: Continue ASA and Plavix for at least one year. Continue statin.        Complications:  None. The patient tolerated the procedure well.

## 2013-04-10 NOTE — Interval H&P Note (Signed)
History and Physical Interval Note:  04/10/2013 12:20 PM  Jonathan Arroyo  has presented today for cardiac cath with the diagnosis of Chest pain/dyspnea.  The various methods of treatment have been discussed with the patient and family. After consideration of risks, benefits and other options for treatment, the patient has consented to  Procedure(s): LEFT HEART CATHETERIZATION WITH CORONARY ANGIOGRAM (N/A) as a surgical intervention .  The patient's history has been reviewed, patient examined, no change in status, stable for surgery.  I have reviewed the patient's chart and labs.  Questions were answered to the patient's satisfaction.    Cath Lab Visit (complete for each Cath Lab visit)  Clinical Evaluation Leading to the Procedure:   ACS: no  Non-ACS:    Anginal Classification: CCS III  Anti-ischemic medical therapy: No Therapy  Non-Invasive Test Results: Low-risk stress test findings: cardiac mortality <1%/year  Prior CABG: Previous CABG        [name]

## 2013-04-10 NOTE — H&P (View-Only) (Signed)
HPI:   This is an 79 year old male patient who has history of coronary artery disease status post CABG in 1994 with redo in 2005, ischemic cardiomyopathy echo in 2011 EF 35-40% with moderate MR, trivial AI. Recent echo 11/2012 EF 45-50%. Last cath in 2005 showed severe native vessel disease and patent LIMA to the LAD. CABG in 2005 with right radial graft to the PDA, SVG to the circumflex. His LIMA to the LAD was patent by cath. His abdominal aortic aneurysm is followed by Dr. Darrick Penna. Last ultrasound 06/2102, 4.6 x 4.8  He also has history of DVT, hypertension, hyperlipidemia and GERD. He's also been told on 3 different occasions that he had atrial fibrillation but we've never documented it in this office.  He comes in today complaining of 2-3 week history of significantly worsening dyspnea on exertion. He was exercising at the gym on a regular basis until 3 months ago when he had to stop because of chronic back pain. He's had occasional chest pressure but it is not associated with exertion and  it eases spontaneously within minutes. He denies radiation of pain, dyspnea, diaphoresis. He's also had a recent endoscopy and has had trouble swallowing and has associated pressure with eating. He denies any edema, dyspnea at rest, palpitations. He has chronic dizziness and balance issues that has not changed. We walked him with an O2 sat monitor on.His O2 sat was 97% at rest and 97% with exercise even though he was symptomatically short of breath.   Allergies:  -- Alum & Mag Hydroxide-Simeth -- Other (See Comments)   --  unknwn  -- Oxycodone-Acetaminophen -- Other (See Comments)   --  unknown  Current Outpatient Prescriptions on File Prior to Visit: aspirin 81 MG tablet, Take 81 mg by mouth daily., Disp: , Rfl:  busPIRone (BUSPAR) 10 MG tablet, Take 10 mg by mouth 2 (two) times daily as needed., Disp: , Rfl:  cloNIDine (CATAPRES) 0.2 MG tablet, Take 0.1 mg by mouth 2 (two) times daily. , Disp: , Rfl:   clopidogrel (PLAVIX) 75 MG tablet, Take 75 mg by mouth daily.  , Disp: , Rfl:  finasteride (PROSCAR) 5 MG tablet, Take 5 mg by mouth daily.  , Disp: , Rfl:  HYDROcodone-acetaminophen (VICODIN) 5-500 MG per tablet, Take 1 tablet by mouth every 6 (six) hours as needed. For pain, Disp: , Rfl:  lisinopril (PRINIVIL,ZESTRIL) 20 MG tablet, Take 20 mg by mouth 2 (two) times daily.  , Disp: , Rfl:  nitroGLYCERIN (NITROSTAT) 0.4 MG SL tablet, Place 0.4 mg under the tongue every 5 (five) minutes as needed. For chest pain, Disp: , Rfl:  pantoprazole (PROTONIX) 40 MG tablet, Take 40 mg by mouth daily. , Disp: , Rfl:  Polyethylene Glycol 8000 POWD, Take by mouth 4 (four) times a week. , Disp: , Rfl:  simvastatin (ZOCOR) 40 MG tablet, Take 40 mg by mouth at bedtime.  , Disp: , Rfl:  spironolactone (ALDACTONE) 25 MG tablet, Take 0.5 tablets (12.5 mg total) by mouth 2 (two) times daily as needed., Disp: , Rfl:  temazepam (RESTORIL) 30 MG capsule, Take 30 mg by mouth at bedtime as needed for sleep., Disp: , Rfl:  zolpidem (AMBIEN) 10 MG tablet, Take 10 mg by mouth at bedtime as needed. For insomnia, Disp: , Rfl:   No current facility-administered medications on file prior to visit.   Past Medical History:   Other and unspecified hyperlipidemia  Renal calculus                                                 Comment:history   Other specified gastritis without mention of h*              Spinal stenosis, unspecified region other than*              Hiatal hernia                                                  Comment:HISTORY   Acute venous embolism and thrombosis of unspec*              Abdominal aneurysm without mention of rupture                Esophageal reflux                                            Unspecified essential hypertension                           Coronary atherosclerosis of unspecified type o*              CHF (congestive heart failure)                                Stomach pain                                    June 2013      Comment:Hospital stay at [facility]   Irregular heart beat                                         Myocardial infarction                                       Past Surgical History:   PTCA                                                          TOTAL KNEE ARTHROPLASTY                                         Comment:right   JOINT REPLACEMENT                                2005  Comment:Right knee   SPINE SURGERY                                                   Comment:X's 3   CORONARY ARTERY BYPASS GRAFT                     1994 and *  Review of patient's family history indicates:   Coronary artery disease        Mother                   Diabetes                       Mother                   Heart disease                  Mother                   Hyperlipidemia                 Mother                   Hypertension                   Mother                   Heart disease                  Father                   Hyperlipidemia                 Father                   Hypertension                   Father                   Social History   Marital Status: Married             Spouse Name:                      Years of Education:                 Number of children:             Occupational History   None on file  Social History Main Topics   Smoking Status: Never Smoker                     Smokeless Status: Never Used                       Alcohol Use: No             Drug Use: No             Sexual Activity: Not on file        Other Topics            Concern   None on file  Social History Narrative   None on  file    ROS: chronic left calf pain since having back problems.   PHYSICAL EXAM: Well-nournished, in no acute distress. Neck: No JVD, HJR, Bruit, or thyroid enlargement  Lungs: No tachypnea, clear without wheezing, rales, or rhonchi  Cardiovascular: RRR, PMI not displaced, 1/6 systolic  murmur at the left sternal border, no bruit, thrill, or heave.  Abdomen: BS normal. Soft without organomegaly, masses, lesions or tenderness.  Extremities: without cyanosis, clubbing or edema. Decreased distal pulses bilateral. No pain in extremities with palpation  SKin: Warm, no lesions or rashes   Musculoskeletal: No deformities  Neuro: no focal signs  There were no vitals taken for this visit.   EKG: Normal sinus rhythm with PACs, LVH  Echo 12/02/12: Study Conclusions  - Left ventricle: Moderately severe hypokinesis of   apical-septal segment and the inferior wall. The cavity   size was mildly dilated. Wall thickness was normal.   Systolic function was mildly reduced. The estimated   ejection fraction was in the range of 45% to 50%. - Left atrium: The atrium was mildly dilated.    Cardiac cath 2005 (pre re-do CABG): 4. The left main coronary artery demonstrates about a 75 to 80% stenosis at   the takeoff involving the origin of the circumflex. The LAD itself was   totally occluded just after the takeoff of the small intermediate vessel,   which itself has some proximal irregularities.   5. Following the origin of a marginal branch, there is 70 and 90% narrowing   noted in the AV circumflex. There is some filling noted distally,   although this is likely competitive.   6. The internal mammary to the distal LAD is widely patent. The distal LAD   has an 80% apical tip stenosis.   7. The saphenous vein graft to two branches of the obtuse marginal   demonstrates severe degenerative disease. There is a stent site   proximally with about 50% smooth narrowing. After this there are three   tandem stenoses of 80, 70 and 80 in an area that looks severely   degenerative. The graft then supplies a marginal branch and then circles   posteriorly to supply a second marginal. In this area is a previously   placed stent which remains widely patent.   8. The right coronary artery is  totally occluded.   9. The saphenous vein graft to the PDA demonstrates multiple areas of   diffuse disease. The graft itself was fairly small caliber graft, but   there is a 90% narrowing in the mid portion at a previously stented site.   There is also 75 and 80% disease noted distally. The PDA is at least   moderate size.

## 2013-04-11 ENCOUNTER — Encounter (HOSPITAL_COMMUNITY): Payer: Self-pay | Admitting: Physician Assistant

## 2013-04-11 ENCOUNTER — Other Ambulatory Visit: Payer: Self-pay | Admitting: Physician Assistant

## 2013-04-11 DIAGNOSIS — I255 Ischemic cardiomyopathy: Secondary | ICD-10-CM

## 2013-04-11 DIAGNOSIS — K449 Diaphragmatic hernia without obstruction or gangrene: Secondary | ICD-10-CM | POA: Insufficient documentation

## 2013-04-11 DIAGNOSIS — I2589 Other forms of chronic ischemic heart disease: Secondary | ICD-10-CM

## 2013-04-11 DIAGNOSIS — F419 Anxiety disorder, unspecified: Secondary | ICD-10-CM | POA: Insufficient documentation

## 2013-04-11 DIAGNOSIS — I200 Unstable angina: Secondary | ICD-10-CM | POA: Diagnosis present

## 2013-04-11 DIAGNOSIS — K2960 Other gastritis without bleeding: Secondary | ICD-10-CM | POA: Insufficient documentation

## 2013-04-11 DIAGNOSIS — I2510 Atherosclerotic heart disease of native coronary artery without angina pectoris: Secondary | ICD-10-CM

## 2013-04-11 DIAGNOSIS — R0789 Other chest pain: Secondary | ICD-10-CM

## 2013-04-11 DIAGNOSIS — Z86718 Personal history of other venous thrombosis and embolism: Secondary | ICD-10-CM | POA: Insufficient documentation

## 2013-04-11 LAB — BASIC METABOLIC PANEL
BUN: 15 mg/dL (ref 6–23)
CO2: 25 mEq/L (ref 19–32)
Creatinine, Ser: 0.98 mg/dL (ref 0.50–1.35)
GFR calc Af Amer: 87 mL/min — ABNORMAL LOW (ref >90)
GFR calc non Af Amer: 75 mL/min — ABNORMAL LOW (ref >90)
Glucose, Bld: 92 mg/dL (ref 70–99)
Potassium: 3.8 mEq/L (ref 3.5–5.1)
Sodium: 138 mEq/L (ref 135–145)

## 2013-04-11 LAB — CBC
HCT: 34.9 % — ABNORMAL LOW (ref 39.0–52.0)
Hemoglobin: 12 g/dL — ABNORMAL LOW (ref 13.0–17.0)
MCH: 32.5 pg (ref 26.0–34.0)
MCHC: 34.4 g/dL (ref 30.0–36.0)
RBC: 3.69 MIL/uL — ABNORMAL LOW (ref 4.22–5.81)

## 2013-04-11 MED ORDER — CARVEDILOL 3.125 MG PO TABS
3.1250 mg | ORAL_TABLET | Freq: Two times a day (BID) | ORAL | Status: DC
  Administered 2013-04-11: 10:00:00 3.125 mg via ORAL
  Filled 2013-04-11: qty 1

## 2013-04-11 MED ORDER — CARVEDILOL 3.125 MG PO TABS: 3.1250 mg | ORAL_TABLET | Freq: Two times a day (BID) | ORAL | Status: DC

## 2013-04-11 MED FILL — Sodium Chloride IV Soln 0.9%: INTRAVENOUS | Qty: 50 | Status: AC

## 2013-04-11 NOTE — Discharge Summary (Signed)
See full note this am. cdm 

## 2013-04-11 NOTE — Progress Notes (Addendum)
     SUBJECTIVE: No chest pain or SOB. Feels great. Right groin sore.   Tele: NSR with PACs, PVCs.   BP 143/73  Pulse 83  Temp(Src) 98.6 F (37 C) (Oral)  Resp 20  Ht 6' (1.829 m)  Wt 186 lb 4.6 oz (84.5 kg)  BMI 25.26 kg/m2  SpO2 95%  Intake/Output Summary (Last 24 hours) at 04/11/13 4540 Last data filed at 04/10/13 2100  Gross per 24 hour  Intake  817.5 ml  Output      0 ml  Net  817.5 ml    PHYSICAL EXAM General: Well developed, well nourished, in no acute distress. Alert and oriented x 3.  Psych:  Good affect, responds appropriately Neck: No JVD. No masses noted.  Lungs: Clear bilaterally with no wheezes or rhonci noted.  Heart: RRR with no murmurs noted. Abdomen: Bowel sounds are present. Soft, non-tender.  Extremities: No lower extremity edema. Large ecchymosis right groin, soft.   LABS: Basic Metabolic Panel:  Recent Labs  98/11/91 0530  NA 138  K 3.8  CL 104  CO2 25  GLUCOSE 92  BUN 15  CREATININE 0.98  CALCIUM 9.0   CBC:  Recent Labs  04/11/13 0530  WBC 8.4  HGB 12.0*  HCT 34.9*  MCV 94.6  PLT 145*    Current Meds: . aspirin  81 mg Oral Daily  . cloNIDine  0.1 mg Oral BID  . clopidogrel  75 mg Oral Q breakfast  . finasteride  5 mg Oral Daily  . lisinopril  20 mg Oral BID  . pantoprazole  40 mg Oral Daily  . simvastatin  40 mg Oral QHS     ASSESSMENT AND PLAN:  1. CAD/Unstable angina: s/p cath yesterday with occluded vein graft to Circumflex system. Patent grafts to the LAD and RCA. S/p DES in the distal left main and the distal Circumflex. Large hematoma right groin, stable. H/H stable. Doing well this am. Will need dual anti-platelet therapy with ASA and Plavix. Continue statin. Will start Coreg 3.125 mg po BID. Continue Ace-inh.   2. Ischemic Cardiomyopathy: Continue medical therapy. Repeat echo 3 months. If his LVEF is still less than 35%, will consider ICD.   3. Dispo: D/C home today around 11am if he ambulates and has no  further bleeding issues right groin. Follow up with me in 2-3 weeks.   Lakiyah Arntson  11/25/20147:21 AM

## 2013-04-11 NOTE — Discharge Summary (Signed)
Discharge Summary   Patient ID: Jonathan Arroyo MRN: [id number], DOB/AGE: November 25, 1929 79 y.o. Admit date: 04/10/2013 D/C date:     04/11/2013  Primary Cardiologist: Clifton James  Primary Discharge Diagnoses:  1. CAD/unstable angina - cath this admission: 2/3 patent bypass grafts (Free radial graft to PDA is patent, LIMA graft to mid LAD is patent, both SVG to OM system occluded), s/p PTCA/DES x1 to distal LM and PTCA/DES x1 to distal LCx - history: CABG 1994, redo 2005 2. Ischemic cardiomyopathy - EF history: 2011: 35-40%, 11/2012: 45-50%, this admission: 35% by cath 11/214/14 3. HTN 4. Hyperlipidemia  Secondary Discharge Diagnoses:  1. AAA 4.6x4.8 by ultrasound 06/2012, followed by Dr. Darrick Penna 2. H/o DVT 3. ?Possible prior history of atrial fibrillation 4. H/o Renal calculus  5. H/o other specified gastritis without mention of hemorrhage 6. Hiatal hernia 7. Spinal stenosis 8. Stomach pain Hospital Stay 10/2011 at [facility] 9. Anxiety 10. GERD  Hospital Course: Jonathan Arroyo is an 79 y/o M with history of CAD s/p CABG 1994 redo 2005, ICM (EF 35-40% in 2011, 45-50% in 11/2012), AAA followed by Dr. Darrick Penna, DVT, HTN, HLD, and GERD who presented to the office recently with DOE. He also has been told on 3 different occasions that he had atrial fibrillation but we've never documented it in this office. He presented on 03/29/13 to Ms. Geni Bers PA-C with worsening SOB on exertion. He was exercising at the gym on a regular basis until 3 months ago when he had to stop because of chronic back pain. He also reported occasional chest pressure that was not associated with exertion and eases spontaneously within minutes. He denied radiation of pain, dyspnea, diaphoresis. He was walked in the office with O2 sat monitor on remaining 97% at rest and with exercise despite subjective dyspnea. EKG showed NSR with PACs, LVH. He was set up with 48-hour Holter (results still pending) as well as Lexiscan nuclear stress test. This  stress test was abnormal with a large, severe, partially reversible inferior lateral and apical defect consistent with prior infarct and mild peri-infarct ischemia. Cardiac cath was recommended. He was brought in for this procedure yesterday which showed: 1. Severe triple vessel CAD s/p 3V CABG with 2/3 patent bypass grafts.  2. Occluded vein graft to OM system with high grade disease in distal left main leading into large OM system and severe stenosis distal Circumflex.  3. Successful PTCA/DES x 1 distal Left main artery  4. Successful PTCA/DES x 1 distal left Circumflex artery  5. Moderate LV systolic dysfunction Recommendation was to continue DAPT with ASA/Plavix for at least 1 yr. He tolerated this procedure well. Dr. Clifton James has seen and examined the patient today and feels he is stable for discharge. He remains on ACEI for his cardiomyopathy, and beta blocker was added. He will have a repeat echo in 3 months on med rx and if LVEF is still less than 35%, Dr. Clifton James would consider ICD. Please note as above Holter results are still being processed.  Discharge Vitals: Blood pressure 155/72, pulse 91, temperature 98.2 F (36.8 C), temperature source Oral, resp. rate 18, height 6' (1.829 m), weight 186 lb 4.6 oz (84.5 kg), SpO2 95.00%.  Labs: Lab Results  Component Value Date   WBC 8.4 04/11/2013   HGB 12.0* 04/11/2013   HCT 34.9* 04/11/2013   MCV 94.6 04/11/2013   PLT 145* 04/11/2013     Recent Labs Lab 04/11/13 0530  NA 138  K 3.8  CL 104  CO2 25  BUN 15  CREATININE 0.98  CALCIUM 9.0  GLUCOSE 92     Diagnostic Studies/Procedures   Nuc 04/04/13 Overall Impression: Low risk stress nuclear study with a large, severe, partially reversible inferior lateral and apical defect consistent with prior infarct and mild peri-infarct ischemia.  LV Ejection Fraction: LV Wall Motion: Not gated.  Cardiac Cath 04/10/13 NIKOLAOS MADDOCKS  [id number]  11/24/20142:00 PM  Jonathan Rossetti, MD    Procedure Performed:  1. Left Heart Catheterization 2. Selective Coronary Angiography 3. SVG/free radial artery graft angiography 4. LIMA graft angiography 5. Left ventricular angiogram 6. PTCA/DES x 1 distal left main artery 7. PTCA/DES x 1 distal Circumflex 8. Angioseal right femoral artery Operator: Verne Carrow, MD  Indication: 79 yo male with history of CAD s/p CABG in 1996, redo CABG 2005 with free radial artery graft to the PDA, SVG to OM, known to have patent LIMA graft from first bypass procedure. Recent dyspnea and chest pain c/w unstable angina.  Procedure Details:  The risks, benefits, complications, treatment options, and expected outcomes were discussed with the patient. The patient and/or family concurred with the proposed plan, giving informed consent. The patient was brought to the cath lab after IV hydration was begun and oral premedication was given. The patient was further sedated with Versed and Fentanyl. The right groin was prepped and draped in the usual manner. Using the modified Seldinger access technique, a 5 French sheath was placed in the right femoral artery. Standard diagnostic catheters were used to perform selective coronary angiography. The RCB was used to engage the radial artery graft to the PDA. LCB catheter used to engage both left sided vein grafts. JR4 used to engage the LIMA graft. A pigtail catheter was used to perform a left ventricular angiogram. I elected to proceed with PCI of the native distal left main and distal Circumflex arteries.  PCI Note: The sheath was upsized to a 6 [place] system. The patient was given a weight based bolus of Angiomax and a drip was started. When the ACT was over 200, I engaged the left main with a XB 3.0 guide. I then passed a Cougar IC wire down the Circumflex artery into the distal vessel. A 2.5 x 15 mm balloon was used to pre-dilate the distal left main stenosis and then the distal Circumflex into the second OM branch.  A 2.75 x 20 mm Promus Premier DES was deployed in the distal Circumflex extending into the second OM branch. This was post-dilated with a 2.75 x 15 mm [place] balloon. I then focused on lesion #2. I pulled the 2.75 x 15 Sun balloon into the distal left main and pre-dilated this lesion again. I then carefully positioned and deployed a 3.0 x 16 mm Promus Premier DES in the distal left main extending into the proximal Circumflex. This stent was post-dilated with a 3.5 x 8 mm [place] balloon x 2. There was an excellent angiographic result. The guide was removed.  An Angioseal femoral artery closure device was placed in the right femoral artery.  There were no immediate complications. The patient was taken to the recovery area in stable condition.  Hemodynamic Findings:  Central aortic pressure: 170/76  Left ventricular pressure: 167/11/19  Angiographic Findings:  Left main: 99% distal stenosis.  Left Anterior Descending Artery: Large caliber vessel that courses to the apex. The vessel is occluded at the ostium. The entire proximal, mid and distal vessel including the diagonal fills from the patent IMA graft.  Circumflex  Artery: Large caliber, diffusely calcified vessel. 99% ostial stenosis. 40% proximal stenosis. First obtuse marginal branch is moderate in caliber with a proximal 30% stenosis. The distal AV groove Circumflex has a long 99% stenosis just before the takeoff of the second obtuse marginal branch. The distal AV groove Circumflex beyond this becomes small in caliber and has diffuse 50% stensosis.  Right Coronary Artery: 100% proximal occlusion. The mid and distal vessel fills from the patent free radial graft.  Graft Anatomy:  Both SVG to OM system are occluded  Free radial graft to PDA is patent  LIMA graft to mid LAD is patent  Left Ventricular Angiogram: LVEF=35%. Global hypokinesis.  Impression:  1. Severe triple vessel CAD s/p 3V CABG with 2/3 patent bypass grafts.  2. Occluded vein graft to OM  system with high grade disease in distal left main leading into large OM system and severe stenosis distal Circumflex.  3. Successful PTCA/DES x 1 distal Left main artery  4. Successful PTCA/DES x 1 distal left Circumflex artery  5. Moderate LV systolic dysfunction  Recommendations: Continue ASA and Plavix for at least one year. Continue statin.  Complications: None. The patient tolerated the procedure well  Discharge Medications     Medication List         AMBIEN 10 MG tablet  Generic drug:  zolpidem  Take 10 mg by mouth at bedtime as needed. For insomnia     aspirin 81 MG tablet  Take 81 mg by mouth daily.     busPIRone 10 MG tablet  Commonly known as:  BUSPAR  Take 10 mg by mouth 2 (two) times daily as needed (anxiety).     carvedilol 3.125 MG tablet  Commonly known as:  COREG  Take 1 tablet (3.125 mg total) by mouth 2 (two) times daily with a meal.     cloNIDine 0.2 MG tablet  Commonly known as:  CATAPRES  Take 0.1 mg by mouth 2 (two) times daily.     clopidogrel 75 MG tablet  Commonly known as:  PLAVIX  Take 75 mg by mouth daily.     finasteride 5 MG tablet  Commonly known as:  PROSCAR  Take 5 mg by mouth daily.     HYDROcodone-acetaminophen 5-500 MG per tablet  Commonly known as:  VICODIN  Take 1 tablet by mouth every 6 (six) hours as needed. For pain     lisinopril 20 MG tablet  Commonly known as:  PRINIVIL,ZESTRIL  Take 20 mg by mouth 2 (two) times daily.     nitroGLYCERIN 0.4 MG SL tablet  Commonly known as:  NITROSTAT  Place 0.4 mg under the tongue every 5 (five) minutes as needed. For chest pain     pantoprazole 40 MG tablet  Commonly known as:  PROTONIX  Take 40 mg by mouth daily.     polyethylene glycol packet  Commonly known as:  MIRALAX / GLYCOLAX  Take 17 g by mouth daily as needed for mild constipation.     simvastatin 40 MG tablet  Commonly known as:  ZOCOR  Take 40 mg by mouth at bedtime.        Disposition   The patient will be  discharged in stable condition to home. Discharge Orders   Future Appointments Provider Department Dept Phone   04/25/2013 3:20 PM Kennon Rounds [facility] [place] Office [phone]   07/14/2013 1:00 PM [facility]-Site 3 Echo Echo 1 [facility] SITE 3 ECHO LAB [phone]  07/27/2013 8:30 AM [facility]-Cv Us[facility] [place] [facility] [address] [phone]   Eat a light meal the night before the exam but please avoid gaseous foods.   Nothing to eat or drink for at least 8 hours prior to the exam. No gum chewing or smoking the morning of the exam. Please take your morning medications with small sips of water, especially blood pressure medication. If you have several vascular lab exams and will see physician, please bring a snack with you.   07/27/2013 9:00 AM Carma Lair Nickel, NP [facility] [place] [phone]   Future Orders Complete By Expires   Diet - low sodium heart healthy  As directed    Discharge instructions  As directed    Comments:     One of your heart tests showed weakness of the heart muscle this admission. This may make you more susceptible to weight gain from fluid retention, which can lead to symptoms that we call heart failure. Please read these special instructions: 1. Follow a low-salt diet and watch your fluid intake. In general, you should not be taking in more than 2 liters of fluid per day (no more than 8 glasses per day). Some patients are restricted to less than 1.5 liters of fluid per day (no more than 6 glasses per day). This includes sources of water in foods like soup, coffee, tea, milk, etc. 2. Weigh yourself on the same scale at same time of day and keep a log. 3. Call your doctor: (Anytime you feel any of the following symptoms)  - 3-4 pound weight gain in 1-2 days or 2 pounds overnight  - Shortness of breath, with or without a dry hacking cough  - Swelling in the hands, feet or stomach  - If you have to sleep on extra  pillows at night in order to breathe  IT IS IMPORTANT TO LET YOUR DOCTOR KNOW EARLY ON IF YOU ARE HAVING SYMPTOMS SO WE CAN HELP YOU!   Increase activity slowly  As directed    Comments:     No driving for 2 days. No lifting over 5 lbs for 1 week. No sexual activity for 1 week. Keep procedure site clean & dry. If you notice increased pain, swelling, bleeding or pus, call/return!  You may shower, but no soaking baths/hot tubs/pools for 1 week.     Follow-up Information   Follow up with Jacolyn Reedy, PA-C. (04/25/13 at 3:20pm)    Specialty:  Cardiology   Contact information:   [address] [place], [place] [address] [place] [address] [phone]       Follow up with [facility]. (Heart ultrasound 07/14/13 at 1pm)    Specialty:  Cardiology   Contact information:   [address], [address] [place] [address] [phone]        Duration of Discharge Encounter: Greater than 30 minutes including physician and PA time.  Signed, Ronie Spies PA-C 04/11/2013, 8:57 AM

## 2013-04-11 NOTE — Progress Notes (Signed)
Patient ambulated hall with no s/s intolerance. Right groin site level 1 with bruising. No change in site, no bleeding or swelling noted. PA notified of above.

## 2013-04-11 NOTE — Progress Notes (Signed)
CARDIAC REHAB PHASE I   PRE:  Rate/Rhythm: 94 SR PACs  BP:  Supine:   Sitting: 118/50  Standing:    SaO2:   MODE:  Ambulation: 500 ft   POST:  Rate/Rhythm: 110 ST  BP:  Supine:   Sitting: 113/43  Standing:    SaO2: 96%RA [id number] Pt walked 500 ft on RA with rolling walker and asst x 1. States left knee has tendency to give out at times. Does not like to use cane or walker even though he has both at home. Discussed with pt that he does not want to fall on plavix. Tolerated well. To chair after walk. Education completed with pt . Understanding voiced. Has done CRP 2 before and is not interested in doing it again. States he wants to go back to gym when he can. Instructed pt to give groin a couple of weeks to heal before stationary bike or stepper. Gave walking instructions but encouraged pt to use walker if he feels unsteady as we do not want any falls. Pt has orthopedic issues which interfere with walking at times. Pt stated his breathing has improved since procedure.   Luetta Nutting, RN BSN  04/11/2013 8:35 AM

## 2013-04-25 ENCOUNTER — Ambulatory Visit (INDEPENDENT_AMBULATORY_CARE_PROVIDER_SITE_OTHER): Payer: Medicare Other | Admitting: Physician Assistant

## 2013-04-25 ENCOUNTER — Encounter: Payer: Self-pay | Admitting: Physician Assistant

## 2013-04-25 VITALS — BP 130/68 | HR 69 | Ht 74.0 in | Wt 186.4 lb

## 2013-04-25 DIAGNOSIS — I2589 Other forms of chronic ischemic heart disease: Secondary | ICD-10-CM

## 2013-04-25 DIAGNOSIS — R5383 Other fatigue: Secondary | ICD-10-CM

## 2013-04-25 DIAGNOSIS — S301XXD Contusion of abdominal wall, subsequent encounter: Secondary | ICD-10-CM

## 2013-04-25 DIAGNOSIS — I5022 Chronic systolic (congestive) heart failure: Secondary | ICD-10-CM

## 2013-04-25 DIAGNOSIS — R0609 Other forms of dyspnea: Secondary | ICD-10-CM

## 2013-04-25 DIAGNOSIS — R5381 Other malaise: Secondary | ICD-10-CM

## 2013-04-25 DIAGNOSIS — I255 Ischemic cardiomyopathy: Secondary | ICD-10-CM

## 2013-04-25 DIAGNOSIS — I2510 Atherosclerotic heart disease of native coronary artery without angina pectoris: Secondary | ICD-10-CM

## 2013-04-25 DIAGNOSIS — Z5189 Encounter for other specified aftercare: Secondary | ICD-10-CM

## 2013-04-25 DIAGNOSIS — I10 Essential (primary) hypertension: Secondary | ICD-10-CM

## 2013-04-25 DIAGNOSIS — R0683 Snoring: Secondary | ICD-10-CM

## 2013-04-25 DIAGNOSIS — E785 Hyperlipidemia, unspecified: Secondary | ICD-10-CM

## 2013-04-25 NOTE — Patient Instructions (Signed)
Your physician recommends that you schedule a follow-up appointment in: 4-6 WEEKS WITH DR [facility]  You will need lab work done today ( BMET CBC )  Your physician recommends that you schedule a follow-up appointment WITH YOUR PCP WITH PRIMARY CARE DOCTOR FOR DECREASE ENERGY  Your physician has recommended that you have a sleep study. This test records several body functions during sleep, including: brain activity, eye movement, oxygen and carbon dioxide blood levels, heart rate and rhythm, breathing rate and rhythm, the flow of air through your mouth and nose, snoring, body muscle movements, and chest and belly movement.

## 2013-04-25 NOTE — Progress Notes (Signed)
[address], [address] [place], [place]  [address] Phone: [phone] Fax:  [phone]  Date:  04/25/2013   ID:  Jonathan Arroyo, DOB 1929-09-11, MRN [id number]  PCP:  Feliciana Rossetti, MD  Cardiologist:  Dr. Verne Carrow     History of Present Illness: Jonathan Arroyo is a 79 y.o. Jonathan with a hx of HL, HTN, GERD, CAD s/p CABG (1994 redo in 2005), ischemic cardiomyopathy, MR, hiatal hernia, DVT.  Echo 2011 with LVEF 35-40% with moderate MR, trivial AI.  Cath in 2005 with severe native vessel disease and patency of LIMA to LAD. CABG 07/04/03 with right radial graft to PDA, SVG to Circumflex. His LIMA to LAD was patent by cath. His AAA is followed by Dr. Darrick Penna in [facility]. Last u/s 8/14 with stable 4.7 x 4.8 cm AAA.  Echo (11/2012): Apical septal and inferior HK, EF 45-50%, mild LAE.    He was recently seen in the office with increased dyspnea with exertion. Lexiscan Myoview (04/05/13): Inferior lateral and apical infarct with mild peri-infarct ischemia, low risk study.  He was set up for cardiac catheterization. He was admitted 11/24-11/25.  LHC (04/10/13): Distal left main 99%, ostial LAD occluded, ostial circumflex 99%, proximal circumflex 40%, proximal OM 30%, distal AV circumflex 99%, then 50%, proximal RCA occluded. Both SVG-OM systems occluded, free radial-PDA patent, LIMA-mid LAD patent, EF 35%.  PCI: Promus premier (2.7, 20 mm) DES to the distal circumflex and Promus premier 3 x 16 mm) DES to the distal left main extending into the proximal circumflex.  Recommendation was to repeat echocardiogram in 3 months. If EF is less than 35%, consider ICD. Patient had a large hematoma in his right groin post-catheterization. Hemoglobin remained stable. He was placed on carvedilol at discharge and his ACEI was continued.  Of note, there was some concern recently for atrial fibrillation. 48 hour Holter (03/2013): NSR, frequent PVCs and PACs, lowest heart rate 41.  Patient is here with his wife today. He  states that he feels bad all the time. Overall, it is hard for him to describe any specific symptoms. His wife states that he just has decreased energy. His breathing has improved since his recent PCI. He has chronic chest pain with eating (odynophagia). He had a recent EGD and dilatation. There have been no changes. He denies exertional chest discomfort. Overall, he describes NYHA class 2b symptoms. He denies orthopnea, PND or edema.  Recent Labs: 04/11/2013: Creatinine 0.98; Hemoglobin 12.0*; Potassium 3.8   Wt Readings from Last 3 Encounters:  04/25/13 186 lb 6.4 oz (84.55 kg)  04/11/13 186 lb 4.6 oz (84.5 kg)  04/11/13 186 lb 4.6 oz (84.5 kg)     Past Medical History  Diagnosis Date  . Hyperlipidemia   . Renal calculus     history  . Other specified gastritis without mention of hemorrhage   . Spinal stenosis, unspecified region other than cervical   . Hiatal hernia     HISTORY  . Acute venous embolism and thrombosis of unspecified deep vessels of lower extremity   . Abdominal aneurysm without mention of rupture   . Esophageal reflux   . HTN (hypertension)   . CAD (coronary artery disease)     a. s/p CABG 1994, redo 2005. b. [place] 03/2013: 2/3 patent bpg. s/p PTCA/DES to distal LM and PTCA/DES to distal LCx.  . CHF (congestive heart failure)   . Stomach pain June 2013    Hospital stay at [facility]  . Myocardial  infarction   . Anxiety   . Ischemic cardiomyopathy     a. EF history: 2011: 35-40%, 11/2012: 45-50%. b. 03/2013: 35% by cath.    Current Outpatient Prescriptions  Medication Sig Dispense Refill  . aspirin 81 MG tablet Take 81 mg by mouth daily.      . busPIRone (BUSPAR) 10 MG tablet Take 10 mg by mouth 2 (two) times daily as needed (anxiety).       . carvedilol (COREG) 3.125 MG tablet Take 1 tablet (3.125 mg total) by mouth 2 (two) times daily with a meal.  60 tablet  6  . cloNIDine (CATAPRES) 0.2 MG tablet Take 0.1 mg by mouth 2 (two) times daily.       . clopidogrel  (PLAVIX) 75 MG tablet Take 75 mg by mouth daily.        . finasteride (PROSCAR) 5 MG tablet Take 5 mg by mouth daily.        Marland Kitchen HYDROcodone-acetaminophen (VICODIN) 5-500 MG per tablet Take 1 tablet by mouth every 6 (six) hours as needed. For pain      . lisinopril (PRINIVIL,ZESTRIL) 20 MG tablet Take 20 mg by mouth 2 (two) times daily.        . nitroGLYCERIN (NITROSTAT) 0.4 MG SL tablet Place 0.4 mg under the tongue every 5 (five) minutes as needed. For chest pain      . pantoprazole (PROTONIX) 40 MG tablet Take 40 mg by mouth daily.       . polyethylene glycol (MIRALAX / GLYCOLAX) packet Take 17 g by mouth daily as needed for mild constipation.      . sertraline (ZOLOFT) 25 MG tablet Take 25 mg by mouth daily.      . simvastatin (ZOCOR) 40 MG tablet Take 40 mg by mouth at bedtime.        Marland Kitchen spironolactone (ALDACTONE) 25 MG tablet Take 25 mg by mouth daily.      Marland Kitchen zolpidem (AMBIEN) 10 MG tablet Take 10 mg by mouth at bedtime as needed. For insomnia       No current facility-administered medications for this visit.    Allergies:   Alum & mag hydroxide-simeth and Oxycodone-acetaminophen   Social History:  The patient  reports that he has never smoked. He has never used smokeless tobacco. He reports that he does not drink alcohol or use illicit drugs.   Family History:  The patient's family history includes Coronary artery disease in his mother; Diabetes in his mother; Heart disease in his father and mother; Hyperlipidemia in his father and mother; Hypertension in his father and mother.   ROS:  Please see the history of present illness.   He does have a snoring history. He also has a history of daytime hypersomnolence. He does admit to depressive symptoms. However, he takes his SSRI as needed.   All other systems reviewed and negative.   PHYSICAL EXAM: VS:  BP 130/68  Pulse 69  Ht 6\' 2"  (1.88 m)  Wt 186 lb 6.4 oz (84.55 kg)  BMI 23.92 kg/m2 Well nourished, well developed, in no acute  distress HEENT: normal Neck: no JVD Cardiac:  normal S1, S2; RRR; no murmur Lungs:  clear to auscultation bilaterally, no wheezing, rhonchi or rales Abd: soft, nontender, no hepatomegaly Ext: no edema; right groin with medium-sized hematoma over the right femoral arteriotomy site with diffuse ecchymosis, no pain Skin: warm and dry Neuro:  CNs 2-12 intact, no focal abnormalities noted  EKG:  NSR, HR 69,  LAD, inferior Q waves, PACs and PVCs, T wave inversions in 3, aVF and V6, no change from prior tracing     ASSESSMENT AND PLAN:  1. CAD:  Overall, he is symptomatically improved since his recent PCI to the circumflex and left main.  Continue dual anti-platelet therapy indefinitely. Continue statin. 2. Right Groin Hematoma, s/p Cardiac Cath:  This appears to be stable. Given his symptoms of fatigue, repeat a CBC today. 3. Fatigue:  Etiology is not entirely clear. I will arrange for a sleep study to rule out the possibility of sleep apnea. I will check a CBC today to rule out anemia. I have asked him to follow up with his primary care physician to further address possible depression. He may also need to be tested for low testosterone levels. He may also need a TSH checked. 4. Ischemic CM:  Continue beta blocker, ACEI, Spironolactone. Check a follow up basic metabolic panel today. He has a follow up echocardiogram arranged in 06/2013. His EF remains < 35% he will be considered for ICD. 5. Chronic Systolic CHF:  Volume stable. 6. Hypertension:  He has labile hypertension. Continue current therapy. 7. Hyperlipidemia:  Continue statin. 8. AAA:  Followed by [facility]. 9. GERD:  Continue PPI. 10. Disposition:  Follow up with Dr. Verne Carrow in 6 weeks.   Signed, Tereso Newcomer, PA-C  04/25/2013 4:00 PM

## 2013-04-26 LAB — CBC WITH DIFFERENTIAL/PLATELET
Basophils Absolute: 0 10*3/uL (ref 0.0–0.1)
Basophils Relative: 0.2 % (ref 0.0–3.0)
Eosinophils Absolute: 0.2 10*3/uL (ref 0.0–0.7)
HCT: 38.2 % — ABNORMAL LOW (ref 39.0–52.0)
Hemoglobin: 13.2 g/dL (ref 13.0–17.0)
Lymphocytes Relative: 21.4 % (ref 12.0–46.0)
Lymphs Abs: 1.7 10*3/uL (ref 0.7–4.0)
MCHC: 34.5 g/dL (ref 30.0–36.0)
Monocytes Relative: 7.9 % (ref 3.0–12.0)
Neutro Abs: 5.5 10*3/uL (ref 1.4–7.7)
RBC: 4.02 Mil/uL — ABNORMAL LOW (ref 4.22–5.81)
WBC: 8.1 10*3/uL (ref 4.5–10.5)

## 2013-04-26 LAB — BASIC METABOLIC PANEL
CO2: 28 mEq/L (ref 19–32)
Calcium: 9.3 mg/dL (ref 8.4–10.5)
Chloride: 106 mEq/L (ref 96–112)
Potassium: 4.6 mEq/L (ref 3.5–5.1)
Sodium: 141 mEq/L (ref 135–145)

## 2013-06-09 ENCOUNTER — Encounter: Payer: Self-pay | Admitting: Cardiovascular Disease

## 2013-06-09 ENCOUNTER — Ambulatory Visit (INDEPENDENT_AMBULATORY_CARE_PROVIDER_SITE_OTHER): Payer: Medicare Other | Admitting: Cardiovascular Disease

## 2013-06-09 VITALS — BP 122/68 | HR 62 | Ht 74.0 in | Wt 184.0 lb

## 2013-06-09 DIAGNOSIS — I714 Abdominal aortic aneurysm, without rupture, unspecified: Secondary | ICD-10-CM

## 2013-06-09 DIAGNOSIS — I255 Ischemic cardiomyopathy: Secondary | ICD-10-CM

## 2013-06-09 DIAGNOSIS — I059 Rheumatic mitral valve disease, unspecified: Secondary | ICD-10-CM

## 2013-06-09 DIAGNOSIS — I5022 Chronic systolic (congestive) heart failure: Secondary | ICD-10-CM

## 2013-06-09 DIAGNOSIS — I2510 Atherosclerotic heart disease of native coronary artery without angina pectoris: Secondary | ICD-10-CM

## 2013-06-09 DIAGNOSIS — I491 Atrial premature depolarization: Secondary | ICD-10-CM

## 2013-06-09 DIAGNOSIS — I2589 Other forms of chronic ischemic heart disease: Secondary | ICD-10-CM

## 2013-06-09 DIAGNOSIS — E785 Hyperlipidemia, unspecified: Secondary | ICD-10-CM

## 2013-06-09 DIAGNOSIS — I10 Essential (primary) hypertension: Secondary | ICD-10-CM

## 2013-06-09 DIAGNOSIS — I340 Nonrheumatic mitral (valve) insufficiency: Secondary | ICD-10-CM

## 2013-06-09 NOTE — Patient Instructions (Signed)
Your physician recommends that you schedule a follow-up appointment in:  6-8 weeks.   Your physician has recommended you make the following change in your medication:  Stop Aldactone  You have an echocardiogram scheduled for July 14, 2013

## 2013-06-09 NOTE — Progress Notes (Signed)
History of Present Illness: 80 yo male with history of HLD, HTN, GERD, CAD s/p CABG (1994 redo in 2005), ischemic cardiomyopathy, MR, hiatal hernia, DVT who is here today for cardiac follow up. He has been followed in the past by Dr. Lia Foyer. Redo CABG 07/04/03 with right radial graft to PDA, SVG to Circumflex. His LIMA to LAD was patent by cath. His AAA is followed by Dr. Oneida Alar in [facility]. He was admitted November 2014 with chest pain c/w angina. Cardiac cath 04/10/13 and found to have patent LIMA to LAD, patent free radial graft to PDA but occluded SVG to Circumflex system with 99% distal left main stenosis supplying the Circumflex as well as severe distal Circumflex stenosis. A 2.75 x 20 mm Promus Premier DES was deployed in the distal Circumflex extending into the second OM branch. This was post-dilated with a 2.75 x 15 mm [place] balloon. A 3.0 x 16 mm Promus Premier DES was deployed in the distal left main extending into the proximal Circumflex. This stent was post-dilated with a 3.5 x 8 mm [place] balloon x 2. Echo 11/22/12: Moderately severe hypokinesis of apical-septal segment and the inferior wall. The cavity size was mildly dilated. Wall thickness was normal. Systolic function was mildly reduced. The estimated ejection fraction was in the range of 45% to 50%. Left atrium was mildly dilated. No significant MR.   He is here today for follow up. Overall feeling well. Unhappy with frequent urination on aldactone. He started this back on his own after discharge. Occasional mild chest pains. No change in breathing. He is becoming more active. Groin hematoma/bruising is better.   Primary Care Physician: Gilford Rile  Last Lipid Profile: Followed in primary care.   Past Medical History  Diagnosis Date  . Hyperlipidemia   . Renal calculus     history  . Other specified gastritis without mention of hemorrhage   . Spinal stenosis, unspecified region other than cervical   . Hiatal hernia     HISTORY  . Acute  venous embolism and thrombosis of unspecified deep vessels of lower extremity   . Abdominal aneurysm without mention of rupture   . Esophageal reflux   . HTN (hypertension)   . CAD (coronary artery disease)     a. s/p CABG 1994, redo 2005. b. [place] 03/2013: 2/3 patent bpg. s/p PTCA/DES to distal LM and PTCA/DES to distal LCx.  . CHF (congestive heart failure)   . Stomach pain June 2013    Hospital stay at [facility]  . Myocardial infarction   . Anxiety   . Ischemic cardiomyopathy     a. EF history: 2011: 35-40%, 11/2012: 45-50%. b. 03/2013: 35% by cath.    Past Surgical History  Procedure Laterality Date  . Ptca    . Total knee arthroplasty      right  . Joint replacement  2005    Right knee  . Spine surgery      X's 3  . Coronary artery bypass graft  1994 and  2005  . Coronary angioplasty with stent placement  04/10/2013    DES LEFT MAIN DES LEFT CIRCUMFLEX    DR MCALHANY  . Back surgery    . Rotator cuff repair Left     Current Outpatient Prescriptions  Medication Sig Dispense Refill  . aspirin 81 MG tablet Take 81 mg by mouth daily.      . busPIRone (BUSPAR) 10 MG tablet Take 10 mg by mouth 2 (two) times daily as needed (  anxiety).       . carvedilol (COREG) 3.125 MG tablet Take 1 tablet (3.125 mg total) by mouth 2 (two) times daily with a meal.  60 tablet  6  . cloNIDine (CATAPRES) 0.2 MG tablet Take 0.1 mg by mouth 2 (two) times daily.       . clopidogrel (PLAVIX) 75 MG tablet Take 75 mg by mouth daily.        . finasteride (PROSCAR) 5 MG tablet Take 5 mg by mouth daily.        Marland Kitchen HYDROcodone-acetaminophen (VICODIN) 5-500 MG per tablet Take 1 tablet by mouth every 6 (six) hours as needed. For pain      . lisinopril (PRINIVIL,ZESTRIL) 20 MG tablet Take 20 mg by mouth 2 (two) times daily.        . nitroGLYCERIN (NITROSTAT) 0.4 MG SL tablet Place 0.4 mg under the tongue every 5 (five) minutes as needed. For chest pain      . pantoprazole (PROTONIX) 40 MG tablet Take 40 mg by mouth  daily.       . polyethylene glycol (MIRALAX / GLYCOLAX) packet Take 17 g by mouth daily as needed for mild constipation.      . sertraline (ZOLOFT) 25 MG tablet Take 25 mg by mouth daily.      . simvastatin (ZOCOR) 40 MG tablet Take 40 mg by mouth at bedtime.        Marland Kitchen spironolactone (ALDACTONE) 25 MG tablet Take 25 mg by mouth daily.      Marland Kitchen zolpidem (AMBIEN) 10 MG tablet Take 10 mg by mouth at bedtime as needed. For insomnia       No current facility-administered medications for this visit.    Allergies  Allergen Reactions  . Alum & Mag Hydroxide-Simeth Other (See Comments)    unknwn  . Oxycodone-Acetaminophen Other (See Comments)    unknown    History   Social History  . Marital Status: Married    Spouse Name: N/A    Number of Children: N/A  . Years of Education: N/A   Occupational History  . Not on file.   Social History Main Topics  . Smoking status: Never Smoker   . Smokeless tobacco: Never Used  . Alcohol Use: No  . Drug Use: No  . Sexual Activity: Not on file   Other Topics Concern  . Not on file   Social History Narrative  . No narrative on file    Family History  Problem Relation Age of Onset  . Coronary artery disease Mother   . Diabetes Mother   . Heart disease Mother   . Hyperlipidemia Mother   . Hypertension Mother   . Heart disease Father   . Hyperlipidemia Father   . Hypertension Father     Review of Systems:  As stated in the HPI and otherwise negative.   BP 122/68  Pulse 62  Ht 6\' 2"  (1.88 m)  Wt 184 lb (83.462 kg)  BMI 23.61 kg/m2  SpO2 95%  Physical Examination: General: Well developed, well nourished, NAD HEENT: OP clear, mucus membranes moist SKIN: warm, dry. No rashes. Neuro: No focal deficits Musculoskeletal: Muscle strength 5/5 all ext Psychiatric: Mood and affect normal Neck: No JVD, no carotid bruits, no thyromegaly, no lymphadenopathy. Lungs:Clear bilaterally, no wheezes, rhonci, crackles Cardiovascular: Regular rate  and rhythm. No murmurs, gallops or rubs. Abdomen:Soft. Bowel sounds present. Non-tender.  Extremities: No lower extremity edema. Pulses are 2 + in the bilateral DP/PT.  Cardiac  cath 04/10/13: Left main: 99% distal stenosis.  Left Anterior Descending Artery: Large caliber vessel that courses to the apex. The vessel is occluded at the ostium. The entire proximal, mid and distal vessel including the diagonal fills from the patent IMA graft.  Circumflex Artery: Large caliber, diffusely calcified vessel. 99% ostial stenosis. 40% proximal stenosis. First obtuse marginal branch is moderate in caliber with a proximal 30% stenosis. The distal AV groove Circumflex has a long 99% stenosis just before the takeoff of the second obtuse marginal branch. The distal AV groove Circumflex beyond this becomes small in caliber and has diffuse 50% stensosis.  Right Coronary Artery: 100% proximal occlusion. The mid and distal vessel fills from the patent free radial graft.  Graft Anatomy:  Both SVG to OM system are occluded  Free radial graft to PDA is patent  LIMA graft to mid LAD is patent  Left Ventricular Angiogram: LVEF=35%. Global hypokinesis.  Impression:  1. Severe triple vessel CAD s/p 3V CABG with 2/3 patent bypass grafts.  2. Occluded vein graft to OM system with high grade disease in distal left main leading into large OM system and severe stenosis distal Circumflex.  3. Successful PTCA/DES x 1 distal Left main artery  4. Successful PTCA/DES x 1 distal left Circumflex artery  5. Moderate LV systolic dysfunction  PCI Note: The sheath was upsized to a 6 [place] system. The patient was given a weight based bolus of Angiomax and a drip was started. When the ACT was over 200, I engaged the left main with a XB 3.0 guide. I then passed a Cougar IC wire down the Circumflex artery into the distal vessel. A 2.5 x 15 mm balloon was used to pre-dilate the distal left main stenosis and then the distal Circumflex into  the second OM branch. A 2.75 x 20 mm Promus Premier DES was deployed in the distal Circumflex extending into the second OM branch. This was post-dilated with a 2.75 x 15 mm [place] balloon. I then focused on lesion #2. I pulled the 2.75 x 15 [place] balloon into the distal left main and pre-dilated this lesion again. I then carefully positioned and deployed a 3.0 x 16 mm Promus Premier DES in the distal left main extending into the proximal Circumflex. This stent was post-dilated with a 3.5 x 8 mm [place] balloon x 2. There was an excellent angiographic result. The guide was removed.   Echo 12/02/12: Left ventricle: Moderately severe hypokinesis of apical-septal segment and the inferior wall. The cavity size was mildly dilated. Wall thickness was normal. Systolic function was mildly reduced. The estimated ejection fraction was in the range of 45% to 50%. - Left atrium: The atrium was mildly dilated.  Assessment and Plan:   1. CAD: Stable. He is symptomatically improved since PCI of the circumflex and left main in November 2014. Continue dual anti-platelet therapy indefinitely. Continue statin and beta blocker.   2. Groin Hematoma: Resolved.   3. Fatigue:Improved. H/H were ok post procedure. He will need f/u in primary care for discussion regarding sleep study, testosterone levels.   4. Ischemic CM: Continue beta blocker, ACEI. Will stop Spironolactone with frequent urination which is bothersome to him. Follow up echocardiogram arranged next month to reassess LVEF. If his LVEF remains < 35% he will be considered for ICD.   5. Chronic Systolic CHF: Volume stable.   6. Hypertension: BP stable. Continue current therapy.   7. Hyperlipidemia: Continue statin.   8. AAA: Followed by [facility].  9. Mitral Regurgitation: Mild by echo July 2014.   10. Frequent PACs, PVCs: Noted on telemetry while in hospital. Continue beta blocker.

## 2013-07-10 ENCOUNTER — Other Ambulatory Visit (HOSPITAL_COMMUNITY): Payer: Medicare Other

## 2013-07-14 ENCOUNTER — Ambulatory Visit (HOSPITAL_COMMUNITY): Payer: Medicare Other | Attending: Cardiovascular Disease | Admitting: Radiology

## 2013-07-14 ENCOUNTER — Encounter: Payer: Self-pay | Admitting: Cardiovascular Disease

## 2013-07-14 DIAGNOSIS — I359 Nonrheumatic aortic valve disorder, unspecified: Secondary | ICD-10-CM | POA: Insufficient documentation

## 2013-07-14 DIAGNOSIS — Z86718 Personal history of other venous thrombosis and embolism: Secondary | ICD-10-CM | POA: Insufficient documentation

## 2013-07-14 DIAGNOSIS — Z951 Presence of aortocoronary bypass graft: Secondary | ICD-10-CM | POA: Insufficient documentation

## 2013-07-14 DIAGNOSIS — I255 Ischemic cardiomyopathy: Secondary | ICD-10-CM

## 2013-07-14 DIAGNOSIS — I10 Essential (primary) hypertension: Secondary | ICD-10-CM | POA: Insufficient documentation

## 2013-07-14 DIAGNOSIS — R002 Palpitations: Secondary | ICD-10-CM | POA: Insufficient documentation

## 2013-07-14 DIAGNOSIS — I714 Abdominal aortic aneurysm, without rupture, unspecified: Secondary | ICD-10-CM | POA: Insufficient documentation

## 2013-07-14 DIAGNOSIS — I2589 Other forms of chronic ischemic heart disease: Secondary | ICD-10-CM | POA: Insufficient documentation

## 2013-07-14 DIAGNOSIS — E785 Hyperlipidemia, unspecified: Secondary | ICD-10-CM | POA: Insufficient documentation

## 2013-07-14 DIAGNOSIS — I5032 Chronic diastolic (congestive) heart failure: Secondary | ICD-10-CM | POA: Insufficient documentation

## 2013-07-14 DIAGNOSIS — I079 Rheumatic tricuspid valve disease, unspecified: Secondary | ICD-10-CM | POA: Insufficient documentation

## 2013-07-14 NOTE — Progress Notes (Signed)
Echocardiogram performed.  

## 2013-07-17 ENCOUNTER — Telehealth: Payer: Self-pay | Admitting: *Deleted

## 2013-07-17 NOTE — Telephone Encounter (Signed)
pt notified about echo results with verbal understanding, advised pt to keep f/u w/Dr. Angelena Form 3/4, pt said ok and thnak you.

## 2013-07-19 ENCOUNTER — Encounter: Payer: Self-pay | Admitting: Cardiovascular Disease

## 2013-07-19 ENCOUNTER — Ambulatory Visit (INDEPENDENT_AMBULATORY_CARE_PROVIDER_SITE_OTHER): Payer: Medicare Other | Admitting: Cardiovascular Disease

## 2013-07-19 VITALS — BP 133/82 | HR 71 | Ht 74.0 in | Wt 186.0 lb

## 2013-07-19 DIAGNOSIS — I2510 Atherosclerotic heart disease of native coronary artery without angina pectoris: Secondary | ICD-10-CM

## 2013-07-19 DIAGNOSIS — I5022 Chronic systolic (congestive) heart failure: Secondary | ICD-10-CM

## 2013-07-19 DIAGNOSIS — E785 Hyperlipidemia, unspecified: Secondary | ICD-10-CM

## 2013-07-19 DIAGNOSIS — I2589 Other forms of chronic ischemic heart disease: Secondary | ICD-10-CM

## 2013-07-19 DIAGNOSIS — I10 Essential (primary) hypertension: Secondary | ICD-10-CM

## 2013-07-19 DIAGNOSIS — I359 Nonrheumatic aortic valve disorder, unspecified: Secondary | ICD-10-CM

## 2013-07-19 DIAGNOSIS — I255 Ischemic cardiomyopathy: Secondary | ICD-10-CM

## 2013-07-19 DIAGNOSIS — I491 Atrial premature depolarization: Secondary | ICD-10-CM

## 2013-07-19 NOTE — Patient Instructions (Signed)
Your physician wants you to follow-up in:  6 months. You will receive a reminder letter in the mail two months in advance. If you don't receive a letter, please call our office to schedule the follow-up appointment.   

## 2013-07-19 NOTE — Progress Notes (Signed)
History of Present Illness: 80 yo male with history of HLD, HTN, GERD, CAD s/p CABG (1994 redo in 2005), ischemic cardiomyopathy, MR, hiatal hernia, DVT who is here today for cardiac follow up. He has been followed in the past by Dr. Lia Foyer. Redo CABG 07/04/03 with right radial graft to PDA, SVG to Circumflex. His LIMA to LAD was patent by cath. His AAA is followed by Dr. Oneida Alar in [facility]. He was admitted November 2014 with chest pain c/w angina. Cardiac cath 04/10/13 and found to have patent LIMA to LAD, patent free radial graft to PDA but occluded SVG to Circumflex system with 99% distal left main stenosis supplying the Circumflex as well as severe distal Circumflex stenosis. A 2.75 x 20 mm Promus Premier DES was deployed in the distal Circumflex extending into the second OM branch. This was post-dilated with a 2.75 x 15 mm [place] balloon. A 3.0 x 16 mm Promus Premier DES was deployed in the distal left main extending into the proximal Circumflex. This stent was post-dilated with a 3.5 x 8 mm [place] balloon x 2. Echo February 2015 with LVEF=50%.  He is here today for follow up. Overall feeling well. He has some chest pain after eating. No change in breathing. He is becoming more active.  Primary Care Physician: Gilford Rile  Last Lipid Profile: Followed in primary care.   Past Medical History  Diagnosis Date  . Hyperlipidemia   . Renal calculus     history  . Other specified gastritis without mention of hemorrhage   . Spinal stenosis, unspecified region other than cervical   . Hiatal hernia     HISTORY  . Acute venous embolism and thrombosis of unspecified deep vessels of lower extremity   . Abdominal aneurysm without mention of rupture   . Esophageal reflux   . HTN (hypertension)   . CAD (coronary artery disease)     a. s/p CABG 1994, redo 2005. b. [place] 03/2013: 2/3 patent bpg. s/p PTCA/DES to distal LM and PTCA/DES to distal LCx.  . CHF (congestive heart failure)   . Stomach pain June 2013   Hospital stay at [facility]  . Myocardial infarction   . Anxiety   . Ischemic cardiomyopathy     a. EF history: 2011: 35-40%, 11/2012: 45-50%. b. 03/2013: 35% by cath.    Past Surgical History  Procedure Laterality Date  . Ptca    . Total knee arthroplasty      right  . Joint replacement  2005    Right knee  . Spine surgery      X's 3  . Coronary artery bypass graft  1994 and  2005  . Coronary angioplasty with stent placement  04/10/2013    DES LEFT MAIN DES LEFT CIRCUMFLEX    DR Jadamarie Butson  . Back surgery    . Rotator cuff repair Left     Current Outpatient Prescriptions  Medication Sig Dispense Refill  . aspirin 81 MG tablet Take 81 mg by mouth daily.      . busPIRone (BUSPAR) 10 MG tablet Take 10 mg by mouth 2 (two) times daily as needed (anxiety).       . carvedilol (COREG) 3.125 MG tablet Take 1 tablet (3.125 mg total) by mouth 2 (two) times daily with a meal.  60 tablet  6  . cloNIDine (CATAPRES) 0.2 MG tablet Take 0.1 mg by mouth 2 (two) times daily.       . clopidogrel (PLAVIX) 75 MG tablet  Take 75 mg by mouth daily.        . finasteride (PROSCAR) 5 MG tablet Take 5 mg by mouth daily.        Marland Kitchen HYDROcodone-acetaminophen (VICODIN) 5-500 MG per tablet Take 1 tablet by mouth every 6 (six) hours as needed. For pain      . lisinopril (PRINIVIL,ZESTRIL) 20 MG tablet Take 20 mg by mouth 2 (two) times daily.        . nitroGLYCERIN (NITROSTAT) 0.4 MG SL tablet Place 0.4 mg under the tongue every 5 (five) minutes as needed. For chest pain      . pantoprazole (PROTONIX) 40 MG tablet Take 40 mg by mouth daily.       . polyethylene glycol (MIRALAX / GLYCOLAX) packet Take 17 g by mouth daily as needed for mild constipation.      . sertraline (ZOLOFT) 25 MG tablet Take 25 mg by mouth daily.      . simvastatin (ZOCOR) 40 MG tablet Take 40 mg by mouth at bedtime.        Marland Kitchen zolpidem (AMBIEN) 10 MG tablet Take 10 mg by mouth at bedtime as needed. For insomnia       No current facility-administered  medications for this visit.    Allergies  Allergen Reactions  . Alum & Mag Hydroxide-Simeth Other (See Comments)    unknwn  . Oxycodone-Acetaminophen Other (See Comments)    unknown    History   Social History  . Marital Status: Married    Spouse Name: N/A    Number of Children: N/A  . Years of Education: N/A   Occupational History  . Not on file.   Social History Main Topics  . Smoking status: Never Smoker   . Smokeless tobacco: Never Used  . Alcohol Use: No  . Drug Use: No  . Sexual Activity: Not on file   Other Topics Concern  . Not on file   Social History Narrative  . No narrative on file    Family History  Problem Relation Age of Onset  . Coronary artery disease Mother   . Diabetes Mother   . Heart disease Mother   . Hyperlipidemia Mother   . Hypertension Mother   . Heart disease Father   . Hyperlipidemia Father   . Hypertension Father     Review of Systems:  As stated in the HPI and otherwise negative.   BP 133/82  Pulse 71  Ht 6\' 2"  (1.88 m)  Wt 186 lb (84.369 kg)  BMI 23.87 kg/m2  Physical Examination: General: Well developed, well nourished, NAD HEENT: OP clear, mucus membranes moist SKIN: warm, dry. No rashes. Neuro: No focal deficits Musculoskeletal: Muscle strength 5/5 all ext Psychiatric: Mood and affect normal Neck: No JVD, no carotid bruits, no thyromegaly, no lymphadenopathy. Lungs:Clear bilaterally, no wheezes, rhonci, crackles Cardiovascular: Regular rate and rhythm. No murmurs, gallops or rubs. Abdomen:Soft. Bowel sounds present. Non-tender.  Extremities: No lower extremity edema. Pulses are 2 + in the bilateral DP/PT.  Cardiac cath 04/10/13: Left main: 99% distal stenosis.  Left Anterior Descending Artery: Large caliber vessel that courses to the apex. The vessel is occluded at the ostium. The entire proximal, mid and distal vessel including the diagonal fills from the patent IMA graft.  Circumflex Artery: Large caliber,  diffusely calcified vessel. 99% ostial stenosis. 40% proximal stenosis. First obtuse marginal branch is moderate in caliber with a proximal 30% stenosis. The distal AV groove Circumflex has a long 99% stenosis just  before the takeoff of the second obtuse marginal branch. The distal AV groove Circumflex beyond this becomes small in caliber and has diffuse 50% stensosis.  Right Coronary Artery: 100% proximal occlusion. The mid and distal vessel fills from the patent free radial graft.  Graft Anatomy:  Both SVG to OM system are occluded  Free radial graft to PDA is patent  LIMA graft to mid LAD is patent  Left Ventricular Angiogram: LVEF=35%. Global hypokinesis.  Impression:  1. Severe triple vessel CAD s/p 3V CABG with 2/3 patent bypass grafts.  2. Occluded vein graft to OM system with high grade disease in distal left main leading into large OM system and severe stenosis distal Circumflex.  3. Successful PTCA/DES x 1 distal Left main artery  4. Successful PTCA/DES x 1 distal left Circumflex artery  5. Moderate LV systolic dysfunction  PCI Note: The sheath was upsized to a 6 [place] system. The patient was given a weight based bolus of Angiomax and a drip was started. When the ACT was over 200, I engaged the left main with a XB 3.0 guide. I then passed a Cougar IC wire down the Circumflex artery into the distal vessel. A 2.5 x 15 mm balloon was used to pre-dilate the distal left main stenosis and then the distal Circumflex into the second OM branch. A 2.75 x 20 mm Promus Premier DES was deployed in the distal Circumflex extending into the second OM branch. This was post-dilated with a 2.75 x 15 mm [place] balloon. I then focused on lesion #2. I pulled the 2.75 x 15 [place] balloon into the distal left main and pre-dilated this lesion again. I then carefully positioned and deployed a 3.0 x 16 mm Promus Premier DES in the distal left main extending into the proximal Circumflex. This stent was post-dilated with a 3.5  x 8 mm [place] balloon x 2. There was an excellent angiographic result. The guide was removed.   Echo 07/14/13: Left ventricle: Septal hypokinesis The cavity size was mildly dilated. Wall thickness was increased in a pattern of mild LVH. The estimated ejection fraction was 50%. - Aortic valve: Mild regurgitation. - Left atrium: The atrium was moderately dilated. - Atrial septum: No defect or patent foramen ovale was Identified.  EKG: NSR, rate 71 bpm. Old inferior infarct. Sinus arrythmia.   Assessment and Plan:   1. CAD: Stable. He is symptomatically improved since PCI of the circumflex and left main in November 2014. Continue dual anti-platelet therapy indefinitely. Continue statin and beta blocker.   2. PACs, PVCs: Noted on telemetry while in hospital. EKG today with PACs. Continue beta blocker.   3. Aortic valve insufficiency: Mild by echo February 2015.   4. Ischemic CM: Continue beta blocker, ACEI. Spironolactone stopped due to frequent urination which is bothersome to him. LVEF=50% by echo 2/15.   5. Chronic Systolic CHF: Volume stable.   6. Hypertension: BP stable. Continue current therapy.   7. Hyperlipidemia: Continue statin.   8. AAA: Followed by [facility].   9. Mitral Regurgitation: Trivial by echo February 2015.

## 2013-07-26 ENCOUNTER — Encounter: Payer: Self-pay | Admitting: Family

## 2013-07-27 ENCOUNTER — Ambulatory Visit (INDEPENDENT_AMBULATORY_CARE_PROVIDER_SITE_OTHER): Payer: Medicare Other | Admitting: Family

## 2013-07-27 ENCOUNTER — Encounter: Payer: Self-pay | Admitting: Family

## 2013-07-27 ENCOUNTER — Ambulatory Visit (HOSPITAL_COMMUNITY)
Admission: RE | Admit: 2013-07-27 | Discharge: 2013-07-27 | Disposition: A | Discharge status: Home / Self Care | Payer: Medicare Other | Source: Ambulatory Visit | Attending: Family | Admitting: Family

## 2013-07-27 VITALS — BP 162/94 | HR 75 | Ht 74.0 in | Wt 187.8 lb

## 2013-07-27 DIAGNOSIS — Z48812 Encounter for surgical aftercare following surgery on the circulatory system: Secondary | ICD-10-CM

## 2013-07-27 DIAGNOSIS — I714 Abdominal aortic aneurysm, without rupture, unspecified: Secondary | ICD-10-CM | POA: Insufficient documentation

## 2013-07-27 DIAGNOSIS — I6529 Occlusion and stenosis of unspecified carotid artery: Secondary | ICD-10-CM

## 2013-07-27 NOTE — Progress Notes (Signed)
[facility]   Established Abdominal Aortic Aneurysm  History of Present Illness  Jonathan Arroyo is a 80 y.o. (06-23-1929) male patient of Dr. Oneida Alar followed for known AAA who presents with chief complaint: follow up for AAA.   He has chronic low back pain from lumbar spine issues, loss of some sensation in lower legs from this. No new back or abdominal pain. The patient has known lumbar spine issues and has soreness in both legs with walking, worse in left leg, denies non healing wounds. The patient denies history of stroke or TIA symptoms. He has extensive CAD, has had a CABG with a redo, had 2 cardiac stents placed recently, has had more than one MI. He saw Dr. Serita Sheller last week, states he was told he does not have atrial fib. He states his blood pressure is very difficult to control. CTA of abdomen in 2010 shows left renal artery stenosis, no more recent study of renal arteries available. No carotid Duplex result on file. He takes ASA., Plavix, and a statin.  Pt Diabetic: No Pt smoker: non-smoker  Past Medical History  Diagnosis Date  . Hyperlipidemia   . Renal calculus     history  . Other specified gastritis without mention of hemorrhage   . Spinal stenosis, unspecified region other than cervical   . Hiatal hernia     HISTORY  . Acute venous embolism and thrombosis of unspecified deep vessels of lower extremity   . Abdominal aneurysm without mention of rupture   . Esophageal reflux   . HTN (hypertension)   . CAD (coronary artery disease)     a. s/p CABG 1994, redo 2005. b. [place] 03/2013: 2/3 patent bpg. s/p PTCA/DES to distal LM and PTCA/DES to distal LCx.  . CHF (congestive heart failure)   . Stomach pain June 2013    Hospital stay at [facility]  . Myocardial infarction   . Anxiety   . Ischemic cardiomyopathy     a. EF history: 2011: 35-40%, 11/2012: 45-50%. b. 03/2013: 35% by cath.   Past Surgical History  Procedure Laterality Date  . Ptca     . Total knee arthroplasty      right  . Joint replacement  2005    Right knee  . Spine surgery      X's 3  . Coronary artery bypass graft  1994 and  2005  . Coronary angioplasty with stent placement  04/10/2013    DES LEFT MAIN DES LEFT CIRCUMFLEX    DR MCALHANY  . Back surgery    . Rotator cuff repair Left    Social History History   Social History  . Marital Status: Married    Spouse Name: N/A    Number of Children: N/A  . Years of Education: N/A   Occupational History  . Not on file.   Social History Main Topics  . Smoking status: Never Smoker   . Smokeless tobacco: Never Used  . Alcohol Use: No  . Drug Use: No  . Sexual Activity: Not on file   Other Topics Concern  . Not on file   Social History Narrative  . No narrative on file   Family History Family History  Problem Relation Age of Onset  . Coronary artery disease Mother   . Diabetes Mother   . Heart disease Mother   . Hyperlipidemia Mother   . Hypertension Mother   . Heart disease Father   . Hyperlipidemia Father   .  Hypertension Father     Current Outpatient Prescriptions on File Prior to Visit  Medication Sig Dispense Refill  . aspirin 81 MG tablet Take 81 mg by mouth daily.      . busPIRone (BUSPAR) 10 MG tablet Take 10 mg by mouth 2 (two) times daily as needed (anxiety).       . carvedilol (COREG) 3.125 MG tablet Take 1 tablet (3.125 mg total) by mouth 2 (two) times daily with a meal.  60 tablet  6  . cloNIDine (CATAPRES) 0.2 MG tablet Take 0.1 mg by mouth 2 (two) times daily.       . clopidogrel (PLAVIX) 75 MG tablet Take 75 mg by mouth daily.        . finasteride (PROSCAR) 5 MG tablet Take 5 mg by mouth daily.        Marland Kitchen HYDROcodone-acetaminophen (VICODIN) 5-500 MG per tablet Take 1 tablet by mouth every 6 (six) hours as needed. For pain      . lisinopril (PRINIVIL,ZESTRIL) 20 MG tablet Take 20 mg by mouth 2 (two) times daily.        . nitroGLYCERIN (NITROSTAT) 0.4 MG SL tablet Place 0.4 mg  under the tongue every 5 (five) minutes as needed. For chest pain      . pantoprazole (PROTONIX) 40 MG tablet Take 40 mg by mouth daily.       . polyethylene glycol (MIRALAX / GLYCOLAX) packet Take 17 g by mouth daily as needed for mild constipation.      . sertraline (ZOLOFT) 25 MG tablet Take 25 mg by mouth daily.      . simvastatin (ZOCOR) 40 MG tablet Take 40 mg by mouth at bedtime.        Marland Kitchen zolpidem (AMBIEN) 10 MG tablet Take 10 mg by mouth at bedtime as needed. For insomnia       No current facility-administered medications on file prior to visit.   Allergies  Allergen Reactions  . Alum & Mag Hydroxide-Simeth Other (See Comments)    unknwn  . Oxycodone-Acetaminophen Other (See Comments)    unknown    ROS: See HPI for pertinent positives and negatives.  Physical Examination  Filed Vitals:   07/27/13 0908  BP: 162/94  Pulse: 75  Height: 6\' 2"  (1.88 m)  Weight: 187 lb 12.8 oz (85.186 kg)  SpO2: 98%   Body mass index is 24.1 kg/(m^2).  General: A&O x 3, WD, .  Pulmonary: Sym exp, good air movt, CTAB, no rales, rhonchi, or wheezing.  Cardiac: Irregular, Nl S1, S2, no detected murmur.   Carotid Bruits Left Right   Negative Negative   Aorta is  palpable Radial pulses are:  right radial artery used in a CABG, right ulner artery is 2+ palpable, left radial artery is 1+ palpable                          VASCULAR EXAM:  LE Pulses LEFT RIGHT       FEMORAL   palpable   palpable        POPLITEAL   palpable    palpable       POSTERIOR TIBIAL  not palpable    palpable        DORSALIS PEDIS      ANTERIOR TIBIAL  palpable    palpable      Gastrointestinal: soft, NTND, -G/R, - HSM, - masses, - CVAT B.  Musculoskeletal: M/S 4/5 throughout, Extremities without ischemic changes.  Neurologic: CN 2-12 intact, Pain and light touch intact in extremities are intact  except , Motor exam as listed above.  Non-Invasive Vascular Imaging  AAA Duplex (07/27/2013)  Previous size: 4.8 cm (Date: 01/13/2013)  Current size:  4.8 x 4.8 cm (Date: 07/27/2013)  Medical Decision Making  The patient is a 80 y.o. male who presents with asymptomatic AAA with no increase in size. He has uncontrolled hypertension, CTA of abdomen in 2010 shows left renal artery stenosis, no more recent study of renal arteries available. He has extensive CAD. No carotid Duplex result on file. Both popliteal pulses are strongly palpable.   Based on this patient's exam and diagnostic studies, the patient will follow up in 2-3 weeks with the following studies: carotid Duplex, popliteal aneurysm Duplex, bilateral renal artery Duplex.  The threshold for repair is AAA size > 5.5 cm, growth > 1 cm/yr, and symptomatic status.  I emphasized the importance of maximal medical management including strict control of blood pressure, blood glucose, and lipid levels, antiplatelet agents, obtaining regular exercise, and continued cessation of smoking.   The patient was given information about AAA including signs, symptoms, treatment, and how to minimize the risk of enlargement and rupture of aneurysms.    The patient was advised to call [phone] should the patient experience sudden onset abdominal or back pain.   Thank you for allowing [place] to participate in this patient's care.  Clemon Chambers, RN, MSN, FNP-C [facility] [place] Office: [phone]  Clinic Physician: Early  07/27/2013, 9:12 AM

## 2013-07-27 NOTE — Patient Instructions (Signed)

## 2013-08-11 ENCOUNTER — Encounter: Payer: Self-pay | Admitting: Family

## 2013-08-14 ENCOUNTER — Other Ambulatory Visit: Payer: Self-pay | Admitting: Family

## 2013-08-14 ENCOUNTER — Ambulatory Visit (HOSPITAL_COMMUNITY)
Admission: RE | Admit: 2013-08-14 | Discharge: 2013-08-14 | Disposition: A | Discharge status: Home / Self Care | Payer: Medicare Other | Source: Ambulatory Visit | Attending: Family | Admitting: Family

## 2013-08-14 ENCOUNTER — Encounter: Payer: Self-pay | Admitting: Family

## 2013-08-14 ENCOUNTER — Ambulatory Visit (INDEPENDENT_AMBULATORY_CARE_PROVIDER_SITE_OTHER): Payer: Medicare Other | Admitting: Family

## 2013-08-14 ENCOUNTER — Ambulatory Visit (INDEPENDENT_AMBULATORY_CARE_PROVIDER_SITE_OTHER)
Admission: RE | Admit: 2013-08-14 | Discharge: 2013-08-14 | Disposition: A | Discharge status: Home / Self Care | Payer: Medicare Other | Source: Ambulatory Visit | Attending: Family | Admitting: Family

## 2013-08-14 VITALS — BP 152/88 | HR 59 | Ht 74.0 in | Wt 186.2 lb

## 2013-08-14 DIAGNOSIS — Z48812 Encounter for surgical aftercare following surgery on the circulatory system: Secondary | ICD-10-CM

## 2013-08-14 DIAGNOSIS — I6529 Occlusion and stenosis of unspecified carotid artery: Secondary | ICD-10-CM

## 2013-08-14 DIAGNOSIS — I701 Atherosclerosis of renal artery: Secondary | ICD-10-CM

## 2013-08-14 DIAGNOSIS — I714 Abdominal aortic aneurysm, without rupture, unspecified: Secondary | ICD-10-CM

## 2013-08-14 DIAGNOSIS — I10 Essential (primary) hypertension: Secondary | ICD-10-CM

## 2013-08-14 DIAGNOSIS — I150 Renovascular hypertension: Secondary | ICD-10-CM | POA: Insufficient documentation

## 2013-08-14 NOTE — Patient Instructions (Signed)
Abdominal Aortic Aneurysm An aneurysm is a weakened or damaged part of an artery wall that bulges from the normal force of blood pumping through the body. An abdominal aortic aneurysm is an aneurysm that occurs in the lower part of the aorta, the main artery of the body.  The major concern with an abdominal aortic aneurysm is that it can enlarge and burst (rupture) or blood can flow between the layers of the wall of the aorta through a tear (aorticdissection). Both of these conditions can cause bleeding inside the body and can be life threatening unless diagnosed and treated promptly. CAUSES  The exact cause of an abdominal aortic aneurysm is unknown. Some contributing factors are:   A hardening of the arteries caused by the buildup of fat and other substances in the lining of a blood vessel (arteriosclerosis).  Inflammation of the walls of an artery (arteritis).   Connective tissue diseases, such as Marfan syndrome.   Abdominal trauma.   An infection, such as syphilis or staphylococcus, in the wall of the aorta (infectious aortitis) caused by bacteria. RISK FACTORS  Risk factors that contribute to an abdominal aortic aneurysm may include:  Age older than 60 years.   High blood pressure (hypertension).  Male gender.  Ethnicity (white race).  Obesity.  Family history of aneurysm (first degree relatives only).  Tobacco use. PREVENTION  The following healthy lifestyle habits may help decrease your risk of abdominal aortic aneurysm:  Quitting smoking. Smoking can raise your blood pressure and cause arteriosclerosis.  Limiting or avoiding alcohol.  Keeping your blood pressure, blood sugar level, and cholesterol levels within normal limits.  Decreasing your salt intake. In somepeople, too much salt can raise blood pressure and increase your risk of abdominal aortic aneurysm.  Eating a diet low in saturated fats and cholesterol.  Increasing your fiber intake by including  whole grains, vegetables, and fruits in your diet. Eating these foods may help lower blood pressure.  Maintaining a healthy weight.  Staying physically active and exercising regularly. SYMPTOMS  The symptoms of abdominal aortic aneurysm may vary depending on the size and rate of growth of the aneurysm.Most grow slowly and do not have any symptoms. When symptoms do occur, they may include:  Pain (abdomen, side, lower back, or groin). The pain may vary in intensity. A sudden onset of severe pain may indicate that the aneurysm has ruptured.  Feeling full after eating only small amounts of food.  Nausea or vomiting or both.  Feeling a pulsating lump in the abdomen.  Feeling faint or passing out. DIAGNOSIS  Since most unruptured abdominal aortic aneurysms have no symptoms, they are often discovered during diagnostic exams for other conditions. An aneurysm may be found during the following procedures:  Ultrasonography (A one-time screening for abdominal aortic aneurysm by ultrasonography is also recommended for all men aged 65-75 years who have ever smoked).  X-ray exams.  A computed tomography (CT).  Magnetic resonance imaging (MRI).  Angiography or arteriography. TREATMENT  Treatment of an abdominal aortic aneurysm depends on the size of your aneurysm, your age, and risk factors for rupture. Medication to control blood pressure and pain may be used to manage aneurysms smaller than 6 cm. Regular monitoring for enlargement may be recommended by your caregiver if:  The aneurysm is 3 4 cm in size (an annual ultrasonography may be recommended).  The aneurysm is 4 4.5 cm in size (an ultrasonography every 6 months may be recommended).  The aneurysm is larger than 4.5   cm in size (your caregiver may ask that you be examined by a vascular surgeon). If your aneurysm is larger than 6 cm, surgical repair may be recommended. There are two main methods for repair of an aneurysm:   Endovascular  repair (a minimally invasive surgery). This is done most often.  Open repair. This method is used if an endovascular repair is not possible. Document Released: 02/11/2005 Document Revised: 08/29/2012 Document Reviewed: 06/03/2012 [employer] Patient Information 2014 [employer].  Renal Artery Stenosis Renal artery stenosis (RAS) is the narrowing of the artery that supplies blood to the kidney. If the narrowing is critical and the kidney does not get enough blood, hypertension (high blood pressure) can develop. This is called renal vascular hypertension (RVH). This is a common, uncommon cause of secondary hypertension. It does not usually happen until there is at least a 70% narrowing of the artery. Decreased blood flow through the renal artery causes the kidney to release increased amounts of a hormone. It is called renin. Renin is a strong blood pressure regulator. When it is high, it causes changes that lead to hypertension. Eventually the kidney not receiving enough blood may shrink in size and become less useful. The high blood pressure that is produced can eventually damage and destroy the remaining kidney. This is called hypertensive nephrosclerosis. If both kidneys fail, it will lead to chronic renal failure.  CAUSES  Most renal artery stenosis is caused by a hardening of the arteries (atherosclerosis). This is called Atherosclerotic Renal Artery Stenosis (AS-RAS). It is caused by a build-up of cholesterol (plaques) on the inner lining of the renal artery. A much less common cause is Fibromuscular Dysplasia (FMD). With it, there is an abnormality in the muscular lining of the renal artery. FMD-RAS occurs almost exclusively in women aged 30 to 40. It rarely affects African Americans or Asians.  SYMPTOMS  Often high blood pressure is discovered on a routine blood pressure check. It may be the only sign that something is wrong. Other problems that may occur are:  You may develop calf pain when  walking. This is called intermittent claudication. It may be a sign of bad circulation in the legs.  Inability to use certain blood pressure pills such as angiotensin-I (ACE-I) inhibitors or angiotensin receptor blockers (ARB's). These could cause sudden drops in blood pressure with worsening of kidney function.  More than three antihypertensive medications may be needed for blood pressure control.  New onset of high blood pressure if you are over 55. DIAGNOSIS  Your caregiver may find suggestions of this on exam if he finds bruits (like murmurs) on listening to your abdomen (belly) or the large arteries in your neck. Your caregiver may also suspect this there is a sudden worsening of your blood pressure when it has been well controlled and you are over age 60. Additional testing that may be done includes:  One diagnostic method used for renal artery stenosis (RAS) is to measure and compare the level of renin, (blood pressure-regulating hormone released by the kidneys), in the right to the left renal veins. If the amount of renin released by one-side is markedly higher than the other, this identifies a high renin-releasing kidney consistent with RAS.  FMD-RAS is often found on renal scan with ACE-inhibitor challenge, or ultrasound with Doppler.  FMD responds well to angioplasty and stenting. The results of stenting in FMD are usually long lasting. RISK FACTORS  Most renal artery stenosis is caused by a hardening of the arteries. This   This is called atherosclerosis. Other risk factors associated with the development of atherosclerotic RAS include the following:   Carotid artery disease.  Obesity.  High blood pressure.  Heredity.  Old age.  Fibromuscular dysplasia.  Diabetes mellitus.  Smoking.  Hardening of the arteries. TREATMENT   Renal vascular hypertension can be very severe. It can also be difficult to control.  Medication is used to control high blood pressure (hypertension).  Blood pressure medications that directly affect the renin angiotensin pathway can be used toe help control blood pressure. ACE inhibitors and angiotensin receptor blockers (ARB's) are often effective in patients with unilateral RAS. In some cases, patients with RAS are resistant to these medications.  In patients with bilateral RAS, these medications must be used carefully. They may cause acute renal failure (ARF). If acute renal failure develops (if creatinine increases by more than 30%), the medication is discontinued. The patient is evaluated for bilateral RAS.  Angioplasty and stenting may be used to improve blood flow. The goal is to improve the circulation of blood flow to the kidney and prevent the release of excess renin, which can help to decrease blood pressure. This helps to prevent atrophy of the kidney. In general, patients with AS-RAS should have stenting done. This is because plasty by itself has a high incidence of re-stenosis.  Surgery to bypass the narrowing may be done. If the kidney with RAS has diminished in size or strength (atrophied ), surgical removal of the kidney may be advised. This is called nephrectomy. Document Released: 01/28/2005 Document Revised: 07/27/2011 Document Reviewed: 05/03/2008 [facility] Patient Information 2014 [employer].

## 2013-08-14 NOTE — Progress Notes (Signed)
Established Renal Artery Stenosis  History of Present Illness  Jonathan Arroyo is a 80 y.o. (12-03-1929) male patient of Dr. Oneida Alar followed for known AAA.  He returns today for Duplex of renal arteries and popliteal arteries evaluation. He has chronic low back pain from lumbar spine issues, loss of some sensation in lower legs from this.  No new back or abdominal pain.  The patient has known lumbar spine issues and has soreness in both legs with walking, worse in left leg, denies non healing wounds.  The patient denies history of stroke or TIA symptoms.  He has extensive CAD, has had a CABG with a redo, had 2 cardiac stents placed recently, has had more than one MI.  He saw Dr. Serita Sheller last week, states he was told he does not have atrial fib.  He states his blood pressure is very difficult to control.  CTA of abdomen in 2010 shows left renal artery stenosis, no more recent study of renal arteries available.  No carotid Duplex result on file, but apparently a carotid Duplex will not be covered unless he has a carotid bruit which he does not.   He takes ASA., Plavix, and a statin.  Pt Diabetic: No  Pt smoker: non-smoker  He had severe bleeding issues with the last 2 cardiac stents placement.  Blood pressure is 152/88 now, states is labile 100/60 to 200/110 within 30 minutes. States his recent medication adjustment and addition has controlled his blood pressure better.  The patient's urinary history has remained stable.  The patient's PMH, PSH, SH, FamHx, Med, and Allergies are unchanged from 07/27/2013.  On ROS today: see HPI for pertinent positives and negatives. States he has a bad odor to his urine for several weeks, none this AM, however,  patient advised to let his PCP know this ASAP.  Physical Examination  Filed Vitals:   08/14/13 1335  BP: 152/88  Pulse: 59  Height: 6\' 2"  (1.88 m)  Weight: 186 lb 3.2 oz (84.46 kg)  SpO2: 96%   Body mass index is 23.9  kg/(m^2).  General: A&O x 3, WD, .  Pulmonary: Sym exp, good air movt, CTAB, no rales, rhonchi, or wheezing.  Cardiac: Irregular, Nl S1, S2, no detected murmur.    Carotid Bruits  Left  Right     Negative  Negative    Aorta is palpable  Radial pulses are: right radial artery used in a CABG, right ulner artery is 2+ palpable, left radial artery is 1+ palpable  VASCULAR EXAM:    LE Pulses  LEFT  RIGHT    FEMORAL  palpable  palpable    POPLITEAL  palpable  palpable    POSTERIOR TIBIAL  not palpable  palpable    DORSALIS PEDIS  ANTERIOR TIBIAL  palpable  palpable    Gastrointestinal: soft, NTND, -G/R, - HSM, - masses, - CVAT B.  Musculoskeletal: M/S 4/5 throughout, Extremities without ischemic changes.  Neurologic: CN 2-12 intact, Pain and light touch intact in extremities are intact except , Motor exam as listed above.        Non-Invasive Vascular Imaging (08/14/2013)  LOWER EXTREMITY ARTERIAL DUPLEX EVALUATION      INDICATION: Abdominal Aortic Anuerysm    PREVIOUS INTERVENTION(S): N/A    DUPLEX EXAM:     Popliteal Artery Diameter   AP (cm) Transverse (cm)  Right 0.87 0.91  Left 1.05 1.09     ADDITIONAL FINDINGS: Minimal heterogeneous plaque present at the popliteal arteries without hemodynamic  changes present.    IMPRESSION: Right popliteal artery is within normal diameters. Left popliteal artery presents with aneurysmal dilatation measuring 1.05cm x 1.09cm. Multiphasic spectral Doppler signals present at the popliteal arteries. Minimal increase in diameter of left popliteal artery since previous study on 02/15/2008.    RENAL ARTERY DUPLEX EVALUATION (08/14/2013)     INDICATION: Atherosclerosis of renal artery; HTN    PREVIOUS INTERVENTION(S): N/A    DUPLEX EXAM:     AORTA Peak Systolic Velocity (cm/s): 75    RIGHT  LEFT   Peak Systolic Velocities (cm/s) Comments  Peak Systolic Velocities (cm/s) Comments  93  Renal Artery Origin/Proximal 251   158  Renal  Artery Mid 196   92  Renal Artery Distal 98     Accessory Renal Artery (when present)    2.1  Renal / Aortic Ratio (RAR) 3.4  12.6 Kidney Size (cm) 12.3  Spontaneous and Phasic  Renal Vein Spontaneous and Phasic    ADDITIONAL FINDINGS: Incidental note made of hypoechoic areas within kidneys which may represent cyst, not vascular in nature.    IMPRESSION: Right renal artery stenosis present of less than 60%. Left renal artery stenosis present in the 60%-99% range. Patent bilateral renal veins with spontaneous and phasic flow present.    Compared to the previous exam:  No previous renal artery ultrasound to compare.     Medical Decision Making  Jonathan Arroyo is a 80 y.o. male who presents with: labile hypertension and left renal artery stenosis. Right renal artery stenosis present of less than 60%. Left renal artery stenosis present in the 60%-99% range. Patent bilateral renal veins with spontaneous and phasic flow present.  Also has small left popliteal artery aneurysm that does not require intervention.   Based on the patient's vascular studies and examination, and after discussing with Dr. Kellie Simmering, I have offered the patient: CTA of abdomen/pelvis in 6 months for evaluation of renal artery origin velocity of 251 cm/sec and RAR of 3.4, compared to CTA abdomen in 2010; these images reviewed by Dr. Kellie Simmering, appears to him to be 80% left renal artery stenosis at that time.   I discussed in depth with the patient the nature of atherosclerosis, and emphasized the importance of maximal medical management including strict control of blood pressure, blood glucose, and lipid levels, antiplatelet agents, obtaining regular exercise, and cessation of smoking.  The patient is aware that without maximal medical management the underlying atherosclerotic disease process will progress, limiting the benefit of any interventions.  Thank you for allowing [place] to participate in this patient's  care.  Clemon Chambers, RN, MSN, FNP-C [facility] [place] Office: [phone] Clinic MD: Kellie Simmering  08/14/2013, 1:58 PM

## 2013-12-22 ENCOUNTER — Telehealth: Payer: Self-pay | Admitting: Cardiovascular Disease

## 2013-12-22 NOTE — Telephone Encounter (Signed)
°  Patient is taking Plavix and has questions about colonoscopy. Please call and advise. Pt's cell # if no answer at home [phone].

## 2013-12-22 NOTE — Telephone Encounter (Signed)
Spoke with patient and he is schedule to have colonoscopy Monday and was advised to hold Plavix Saturday and Sunday.  Chart reviewed, cath report from 04/10/13  1. Severe triple vessel CAD s/p 3V CABG with 2/3 patent bypass grafts.  2. Occluded vein graft to OM system with high grade disease in distal left main leading into large OM system and severe stenosis distal Circumflex.  3. Successful PTCA/DES x 1 distal Left main artery  4. Successful PTCA/DES x 1 distal left Circumflex artery  5. Moderate LV systolic dysfunction Advised patient he should not hold Plavix until after one year from cath. Patient requested that I call Dr Steve Rattler (GI MD) office and advise them. Did call, gave recommendations. Per Lawerance Bach, nurse with Dr Lyndel Safe, was hoping to hold Plavix and do colonoscopy secondary to patient having rectal bleeding and feeling fatigue. In conversation with patient he felt bleeding coming from hemorrhoids. Did discuss with Dr Burt Knack at request of GI office, Dr Burt Knack felt patient should not hold until after one year and recommended diagnostic colonoscopy. Recommendation given to [place].

## 2013-12-22 NOTE — Telephone Encounter (Signed)
Will forward to Dr Angelena Form and Fraser Din RN so they will be aware

## 2013-12-25 NOTE — Telephone Encounter (Signed)
Thanks, chris 

## 2014-01-25 ENCOUNTER — Ambulatory Visit: Payer: Medicare Other | Admitting: Family

## 2014-01-25 ENCOUNTER — Other Ambulatory Visit (HOSPITAL_COMMUNITY): Payer: Medicare Other

## 2014-02-02 ENCOUNTER — Encounter: Payer: Self-pay | Admitting: Cardiovascular Disease

## 2014-02-02 ENCOUNTER — Ambulatory Visit (INDEPENDENT_AMBULATORY_CARE_PROVIDER_SITE_OTHER): Payer: Medicare Other | Admitting: Cardiovascular Disease

## 2014-02-02 VITALS — BP 126/84 | HR 60 | Ht 74.0 in | Wt 187.0 lb

## 2014-02-02 DIAGNOSIS — I059 Rheumatic mitral valve disease, unspecified: Secondary | ICD-10-CM

## 2014-02-02 DIAGNOSIS — I2510 Atherosclerotic heart disease of native coronary artery without angina pectoris: Secondary | ICD-10-CM

## 2014-02-02 DIAGNOSIS — I714 Abdominal aortic aneurysm, without rupture, unspecified: Secondary | ICD-10-CM

## 2014-02-02 DIAGNOSIS — I2589 Other forms of chronic ischemic heart disease: Secondary | ICD-10-CM

## 2014-02-02 DIAGNOSIS — I340 Nonrheumatic mitral (valve) insufficiency: Secondary | ICD-10-CM

## 2014-02-02 DIAGNOSIS — I10 Essential (primary) hypertension: Secondary | ICD-10-CM

## 2014-02-02 DIAGNOSIS — I359 Nonrheumatic aortic valve disorder, unspecified: Secondary | ICD-10-CM

## 2014-02-02 DIAGNOSIS — R0989 Other specified symptoms and signs involving the circulatory and respiratory systems: Secondary | ICD-10-CM

## 2014-02-02 DIAGNOSIS — I255 Ischemic cardiomyopathy: Secondary | ICD-10-CM

## 2014-02-02 DIAGNOSIS — R0609 Other forms of dyspnea: Secondary | ICD-10-CM

## 2014-02-02 NOTE — Progress Notes (Signed)
History of Present Illness: 80 yo male with history of HLD, HTN, GERD, CAD s/p CABG (1994 redo in 2005), ischemic cardiomyopathy, MR, hiatal hernia, DVT who is here today for cardiac follow up. He has been followed in the past by Dr. Lia Foyer. Redo CABG 07/04/03 with right radial graft to PDA, SVG to Circumflex. His LIMA to LAD was patent by cath. His AAA is followed by Dr. Oneida Alar in [facility]. He was admitted November 2014 with chest pain c/w angina. Cardiac cath 04/10/13 and found to have patent LIMA to LAD, patent free radial graft to PDA but occluded SVG to Circumflex system with 99% distal left main stenosis supplying the Circumflex as well as severe distal Circumflex stenosis. A 2.75 x 20 mm Promus Premier DES was deployed in the distal Circumflex extending into the second OM branch. This was post-dilated with a 2.75 x 15 mm [place] balloon. A 3.0 x 16 mm Promus Premier DES was deployed in the distal left main extending into the proximal Circumflex. This stent was post-dilated with a 3.5 x 8 mm [place] balloon x 2. Echo February 2015 with LVEF=50%.  He is here today for follow up. Rare chest pains. He has had two episodes of chest pain over the last 8 months, each episode resolving with NTG SL.  He is becoming more active.  Primary Care Physician: Gilford Rile  Last Lipid Profile: Followed in primary care.   Past Medical History  Diagnosis Date  . Hyperlipidemia   . Renal calculus     history  . Other specified gastritis without mention of hemorrhage   . Spinal stenosis, unspecified region other than cervical   . Hiatal hernia     HISTORY  . Acute venous embolism and thrombosis of unspecified deep vessels of lower extremity   . Abdominal aneurysm without mention of rupture   . Esophageal reflux   . HTN (hypertension)   . CAD (coronary artery disease)     a. s/p CABG 1994, redo 2005. b. [place] 03/2013: 2/3 patent bpg. s/p PTCA/DES to distal LM and PTCA/DES to distal LCx.  . CHF (congestive heart failure)    . Stomach pain June 2013    Hospital stay at [facility] [place]  . Myocardial infarction   . Anxiety   . Ischemic cardiomyopathy     a. EF history: 2011: 35-40%, 11/2012: 45-50%. b. 03/2013: 35% by cath.    Past Surgical History  Procedure Laterality Date  . Ptca    . Total knee arthroplasty      right  . Joint replacement  2005    Right knee  . Spine surgery      X's 3  . Coronary artery bypass graft  1994 and  2005  . Coronary angioplasty with stent placement  04/10/2013    DES LEFT MAIN DES LEFT CIRCUMFLEX    DR Cavan Bearden  . Back surgery    . Rotator cuff repair Left     Current Outpatient Prescriptions  Medication Sig Dispense Refill  . aspirin 81 MG tablet Take 81 mg by mouth daily.      . busPIRone (BUSPAR) 10 MG tablet Take 10 mg by mouth 2 (two) times daily as needed (anxiety).       . carvedilol (COREG) 3.125 MG tablet Take 1 tablet (3.125 mg total) by mouth 2 (two) times daily with a meal.  60 tablet  6  . cloNIDine (CATAPRES) 0.2 MG tablet Take 0.1 mg by mouth 2 (two) times daily.       Marland Kitchen  clopidogrel (PLAVIX) 75 MG tablet Take 75 mg by mouth daily.        . finasteride (PROSCAR) 5 MG tablet Take 5 mg by mouth daily.        Marland Kitchen lisinopril (PRINIVIL,ZESTRIL) 20 MG tablet Take 20 mg by mouth 2 (two) times daily.        . nitroGLYCERIN (NITROSTAT) 0.4 MG SL tablet Place 0.4 mg under the tongue every 5 (five) minutes as needed (MAX 3 TABLETS). For chest pain      . pantoprazole (PROTONIX) 40 MG tablet Take 40 mg by mouth daily.       . polyethylene glycol (MIRALAX / GLYCOLAX) packet Take 17 g by mouth daily as needed for mild constipation.      . sertraline (ZOLOFT) 25 MG tablet Take 25 mg by mouth daily.      . simvastatin (ZOCOR) 40 MG tablet Take 40 mg by mouth at bedtime.        Marland Kitchen zolpidem (AMBIEN) 10 MG tablet Take 10 mg by mouth at bedtime as needed. For insomnia       No current facility-administered medications for this visit.    Allergies  Allergen Reactions  . Alum & Mag  Hydroxide-Simeth Other (See Comments)    unknown  . Oxycodone-Acetaminophen Other (See Comments)    unknown    History   Social History  . Marital Status: Married    Spouse Name: N/A    Number of Children: N/A  . Years of Education: N/A   Occupational History  . Not on file.   Social History Main Topics  . Smoking status: Never Smoker   . Smokeless tobacco: Never Used  . Alcohol Use: No  . Drug Use: No  . Sexual Activity: Not on file   Other Topics Concern  . Not on file   Social History Narrative  . No narrative on file    Family History  Problem Relation Age of Onset  . Coronary artery disease Mother   . Diabetes Mother   . Heart disease Mother   . Hyperlipidemia Mother   . Hypertension Mother   . Heart disease Father   . Hyperlipidemia Father   . Hypertension Father     Review of Systems:  As stated in the HPI and otherwise negative.   BP 126/84  Pulse 60  Ht 6\' 2"  (1.88 m)  Wt 187 lb (84.823 kg)  BMI 24.00 kg/m2  Physical Examination: General: Well developed, well nourished, NAD HEENT: OP clear, mucus membranes moist SKIN: warm, dry. No rashes. Neuro: No focal deficits Musculoskeletal: Muscle strength 5/5 all ext Psychiatric: Mood and affect normal Neck: No JVD, no carotid bruits, no thyromegaly, no lymphadenopathy. Lungs:Clear bilaterally, no wheezes, rhonci, crackles Cardiovascular: Regular rate and rhythm. No murmurs, gallops or rubs. Abdomen:Soft. Bowel sounds present. Non-tender.  Extremities: No lower extremity edema. Pulses are 2 + in the bilateral DP/PT.  Cardiac cath 04/10/13: Left main: 99% distal stenosis.  Left Anterior Descending Artery: Large caliber vessel that courses to the apex. The vessel is occluded at the ostium. The entire proximal, mid and distal vessel including the diagonal fills from the patent IMA graft.  Circumflex Artery: Large caliber, diffusely calcified vessel. 99% ostial stenosis. 40% proximal stenosis. First  obtuse marginal branch is moderate in caliber with a proximal 30% stenosis. The distal AV groove Circumflex has a long 99% stenosis just before the takeoff of the second obtuse marginal branch. The distal AV groove Circumflex beyond this becomes  small in caliber and has diffuse 50% stensosis.  Right Coronary Artery: 100% proximal occlusion. The mid and distal vessel fills from the patent free radial graft.  Graft Anatomy:  Both SVG to OM system are occluded  Free radial graft to PDA is patent  LIMA graft to mid LAD is patent  Left Ventricular Angiogram: LVEF=35%. Global hypokinesis.  Impression:  1. Severe triple vessel CAD s/p 3V CABG with 2/3 patent bypass grafts.  2. Occluded vein graft to OM system with high grade disease in distal left main leading into large OM system and severe stenosis distal Circumflex.  3. Successful PTCA/DES x 1 distal Left main artery  4. Successful PTCA/DES x 1 distal left Circumflex artery  5. Moderate LV systolic dysfunction  PCI Note: The sheath was upsized to a 6 [place] system. The patient was given a weight based bolus of Angiomax and a drip was started. When the ACT was over 200, I engaged the left main with a XB 3.0 guide. I then passed a [place] IC wire down the Circumflex artery into the distal vessel. A 2.5 x 15 mm balloon was used to pre-dilate the distal left main stenosis and then the distal Circumflex into the second OM branch. A 2.75 x 20 mm Promus Premier DES was deployed in the distal Circumflex extending into the second OM branch. This was post-dilated with a 2.75 x 15 mm [place] balloon. I then focused on lesion #2. I pulled the 2.75 x 15 [place] balloon into the distal left main and pre-dilated this lesion again. I then carefully positioned and deployed a 3.0 x 16 mm Promus Premier DES in the distal left main extending into the proximal Circumflex. This stent was post-dilated with a 3.5 x 8 mm [place] balloon x 2. There was an excellent angiographic result. The guide  was removed.   Echo 07/14/13: Left ventricle: Septal hypokinesis The cavity size was mildly dilated. Wall thickness was increased in a pattern of mild LVH. The estimated ejection fraction was 50%. - Aortic valve: Mild regurgitation. - Left atrium: The atrium was moderately dilated. - Atrial septum: No defect or patent foramen ovale was Identified.  EKG: Sinus, rate 61 bpm. Non-specific T wave abnormality.   Assessment and Plan:   1. CAD: Stable. He is symptomatically improved since PCI of the circumflex and left main in November 2014. Continue dual anti-platelet therapy indefinitely. Continue statin and beta blocker.   2. PACs, PVCs: Noted on telemetry while in hospital. EKG today with PACs. Continue beta blocker.   3. Aortic valve insufficiency: Mild by echo February 2015.   4. Ischemic CM: Continue beta blocker, ACEI. Spironolactone stopped due to frequent urination which is bothersome to him. LVEF=50% by echo 2/15.   5. Chronic Systolic CHF: Volume stable.   6. Hypertension: BP stable. Continue current therapy.   7. Hyperlipidemia: Continue statin. Lipids followed in primary care.   8. AAA: Followed by [facility].   9. Mitral Regurgitation: Trivial by echo February 2015.   10. Dyspnea on exertion: Lungs are clear. No new murmurs. Weight stable. No signs of CHF. Dsypnea only occurs occasionally and worsened last 4 weeks when outdoors. May be component of reactive airways disease.

## 2014-02-02 NOTE — Patient Instructions (Signed)
Your physician wants you to follow-up in:  6 months. You will receive a reminder letter in the mail two months in advance. If you don't receive a letter, please call our office to schedule the follow-up appointment.   

## 2014-02-09 ENCOUNTER — Telehealth: Payer: Self-pay

## 2014-02-09 NOTE — Telephone Encounter (Signed)
rec'd report of CTA Abd/ Pelvis; AAA measures 5.3 x 4.9 cm.  Discussed w/ Dr. Bridgett Larsson.  Pt. Has f/u appt. with Dr. Oneida Alar 02/22/14.  Recommended to check if pt. Is symptomatic, and if he is, to bring in sooner for eval.  If asymptomatic, okay to f/u on 02/22/14.  Called pt.  Questioned if he was having any symptoms.  Stated he gets a little nauseated and bloated at times after eating.  Denies abdominal or back pain.  Advised to keep appt. on 10/8, but to call office if symptoms worsen, to include abdominal pain or back pain.  Verb. understanding.

## 2014-02-21 ENCOUNTER — Encounter: Payer: Self-pay | Admitting: Vascular Surgery

## 2014-02-22 ENCOUNTER — Encounter: Payer: Self-pay | Admitting: Vascular Surgery

## 2014-02-22 ENCOUNTER — Ambulatory Visit (INDEPENDENT_AMBULATORY_CARE_PROVIDER_SITE_OTHER): Payer: Medicare Other | Admitting: Vascular Surgery

## 2014-02-22 ENCOUNTER — Other Ambulatory Visit: Payer: Self-pay | Admitting: *Deleted

## 2014-02-22 VITALS — BP 150/69 | HR 51 | Ht 74.0 in | Wt 183.0 lb

## 2014-02-22 DIAGNOSIS — I739 Peripheral vascular disease, unspecified: Secondary | ICD-10-CM

## 2014-02-22 DIAGNOSIS — I2510 Atherosclerotic heart disease of native coronary artery without angina pectoris: Secondary | ICD-10-CM

## 2014-02-22 DIAGNOSIS — I714 Abdominal aortic aneurysm, without rupture, unspecified: Secondary | ICD-10-CM

## 2014-02-22 NOTE — Progress Notes (Signed)
Established Abdominal Aortic Aneurysm  History of Present Illness  The patient is a 80 y.o. (02-14-1930) male who presents with chief complaint: follow up for AAA.  Previous studies demonstrate an AAA, measuring 4.8 cm. Today, he presents with a AAA measuring 5.3 on CTA abdomen/pelvis from [facility] on 02/09/14.  The patient does not have back or abdominal pain.  The patient is not a smoker. He also has a known left popliteal artery aneurysm which was last sized at 1 cm March 2015. Right popliteal artery was normal diameter. He also has a history of a renal artery duplex scan showed a left renal artery stenosis. He is currently on 2 blood pressure medications with no evidence of decline in renal function.  He has a history of CAD s/p CABG and redo-CABG and PCI. He was last seen by his cardiologist Dr. Angelena Form on 02/02/14. He is currently on plavix and aspirin.    Current Outpatient Prescriptions on File Prior to Visit  Medication Sig Dispense Refill  . aspirin 81 MG tablet Take 81 mg by mouth daily.      . busPIRone (BUSPAR) 10 MG tablet Take 10 mg by mouth 2 (two) times daily as needed (anxiety).       . carvedilol (COREG) 3.125 MG tablet Take 1 tablet (3.125 mg total) by mouth 2 (two) times daily with a meal.  60 tablet  6  . cloNIDine (CATAPRES) 0.2 MG tablet Take 0.1 mg by mouth 2 (two) times daily.       . clopidogrel (PLAVIX) 75 MG tablet Take 75 mg by mouth daily.        . finasteride (PROSCAR) 5 MG tablet Take 5 mg by mouth daily.        Marland Kitchen lisinopril (PRINIVIL,ZESTRIL) 20 MG tablet Take 20 mg by mouth 2 (two) times daily.        . nitroGLYCERIN (NITROSTAT) 0.4 MG SL tablet Place 0.4 mg under the tongue every 5 (five) minutes as needed (MAX 3 TABLETS). For chest pain      . pantoprazole (PROTONIX) 40 MG tablet Take 40 mg by mouth daily.       . polyethylene glycol (MIRALAX / GLYCOLAX) packet Take 17 g by mouth daily as needed for mild constipation.      . sertraline (ZOLOFT) 25  MG tablet Take 25 mg by mouth daily.      . simvastatin (ZOCOR) 40 MG tablet Take 40 mg by mouth at bedtime.        Marland Kitchen zolpidem (AMBIEN) 10 MG tablet Take 10 mg by mouth at bedtime as needed. For insomnia       No current facility-administered medications on file prior to visit.     Physical Examination  Filed Vitals:   02/22/14 1352  BP: 150/69  Pulse: 51  Height: 6\' 2"  (1.88 m)  Weight: 183 lb (83.008 kg)  SpO2: 98%   Body mass index is 23.49 kg/(m^2).  General: A&O x 3, WDWN male in NAD  Pulmonary: Sym exp, good air movt, CTAB, no rales, rhonchi, & wheezing   Cardiac: RRR, Nl S1, S2, no Murmurs, rubs or gallops, no carotid bruits.   Vascular: Vessel Right Left  Radial 2+ 2+  Aorta Palpable N/A  Femoral 2+ 2+  Popliteal 3+ 3+  PT Not palpable Not palpable  DP 1+ 2+   Gastrointestinal: soft, NTND, -G/R, - HSM, + pulsatile abdominal mass just below the left costal margin Musculoskeletal: M/S 5/5 throughout.  Extremities  without ischemic changes.  Neurologic:  Pain and light touch intact in extremities. Motor exam as listed above  Non-Invasive Vascular Imaging  AAA Duplex  Previous size: 4.8 cm (Date: 07/27/2013)  CTA abdomen/pelvis  Current size:  5.3 cm (Date: 02/09/14)  Medical Decision Making  The patient is a 80 y.o. male who presents with: asymptomatic AAA with increasing size. Previous measurement on ultrasound was 4.8 cm on 07/27/13 that is now 5.3 cm on CTA.   Left renal artery stenosis present in the 60-99% range. Small left popliteal aneurysm not requiring intervention.    Based on this patient's exam and diagnostic studies, he is a candidate for endovascular AAA repair. He will inevitably need surgical repair of his aneurysm. The patient would like a few days to think before proceeding with surgery.   The threshold for repair is AAA size > 5.5 cm, growth > 1 cm/yr, and symptomatic status.  If the patient decides not to proceed with surgery, he will  follow up in 6 months for repeat duplex.   He will follow up in one year for popliteal artery duplex and renal artery duplex. He is currently asymptomatic.   Thank you for allowing [place] to participate in this patient's care.  Virgina Jock, PA-C [facility] Office: [phone] Pager: [phone]  02/22/2014, 2:36 PM   This patient was seen in conjunction with Dr. Oneida Alar  History and exam details as above. Patient's infrarenal abdominal aortic aneurysm is growing over time. It is now 5.3 cm in diameter. I discussed with the patient fixing this at this point with a percutaneous aneurysm stent graft. CT measurement showed he does have an adequate neck length for a stent graft and adequate access vessels. I also discussed with them continued observation with ultrasound but I believe the inevitably we're going to reach the threshold 5.5 cm sooner rather than later as the aneurysm continues to grow. I also discussed the case today with his cardiologist Dr. Julianne Handler. The patient does have a left main coronary stent but this is functionally for his circumflex artery as he also has a LIMA graft to the LAD which was widely patent. Dr. Julianne Handler would prefer for [place] to continue his Plavix and aspirin if possible. However, he stated that if we needed to be could stop his Plavix temporarily. The patient is going to consider whether or not he wishes to undergo repair and will call us back in a few days. If he decides to hold on repair at this point we'll repeat his duplex scan in 6 months.  Ruta Hinds, MD [facility] [place] Office: [phone] Pager: [phone]

## 2014-03-01 ENCOUNTER — Other Ambulatory Visit: Payer: Self-pay

## 2014-03-02 ENCOUNTER — Encounter: Payer: Self-pay | Admitting: Vascular Surgery

## 2014-03-07 ENCOUNTER — Encounter (HOSPITAL_COMMUNITY): Payer: Self-pay | Admitting: Pharmacy Technician

## 2014-03-09 ENCOUNTER — Encounter (HOSPITAL_COMMUNITY)
Admission: RE | Admit: 2014-03-09 | Discharge: 2014-03-09 | Disposition: A | Discharge status: Home / Self Care | Payer: Medicare Other | Source: Ambulatory Visit | Attending: Vascular Surgery | Admitting: Vascular Surgery

## 2014-03-09 ENCOUNTER — Encounter (HOSPITAL_COMMUNITY): Payer: Self-pay

## 2014-03-09 DIAGNOSIS — I2510 Atherosclerotic heart disease of native coronary artery without angina pectoris: Secondary | ICD-10-CM | POA: Diagnosis not present

## 2014-03-09 DIAGNOSIS — I252 Old myocardial infarction: Secondary | ICD-10-CM | POA: Insufficient documentation

## 2014-03-09 DIAGNOSIS — I255 Ischemic cardiomyopathy: Secondary | ICD-10-CM | POA: Diagnosis not present

## 2014-03-09 DIAGNOSIS — Z86718 Personal history of other venous thrombosis and embolism: Secondary | ICD-10-CM | POA: Diagnosis not present

## 2014-03-09 DIAGNOSIS — I700 Atherosclerosis of aorta: Secondary | ICD-10-CM | POA: Diagnosis not present

## 2014-03-09 DIAGNOSIS — I714 Abdominal aortic aneurysm, without rupture: Secondary | ICD-10-CM | POA: Diagnosis not present

## 2014-03-09 DIAGNOSIS — I509 Heart failure, unspecified: Secondary | ICD-10-CM | POA: Diagnosis not present

## 2014-03-09 DIAGNOSIS — I351 Nonrheumatic aortic (valve) insufficiency: Secondary | ICD-10-CM | POA: Diagnosis not present

## 2014-03-09 DIAGNOSIS — Z01818 Encounter for other preprocedural examination: Secondary | ICD-10-CM | POA: Insufficient documentation

## 2014-03-09 DIAGNOSIS — I10 Essential (primary) hypertension: Secondary | ICD-10-CM | POA: Diagnosis not present

## 2014-03-09 DIAGNOSIS — Z951 Presence of aortocoronary bypass graft: Secondary | ICD-10-CM | POA: Insufficient documentation

## 2014-03-09 DIAGNOSIS — E785 Hyperlipidemia, unspecified: Secondary | ICD-10-CM | POA: Diagnosis not present

## 2014-03-09 LAB — PROTIME-INR
INR: 1.12 (ref 0.00–1.49)
PROTHROMBIN TIME: 14.6 s (ref 11.6–15.2)

## 2014-03-09 LAB — CBC
HCT: 41 % (ref 39.0–52.0)
Hemoglobin: 14.1 g/dL (ref 13.0–17.0)
MCH: 31.6 pg (ref 26.0–34.0)
MCHC: 34.4 g/dL (ref 30.0–36.0)
MCV: 91.9 fL (ref 78.0–100.0)
PLATELETS: 149 10*3/uL — AB (ref 150–400)
RBC: 4.46 MIL/uL (ref 4.22–5.81)
RDW: 13.5 % (ref 11.5–15.5)
WBC: 7.2 10*3/uL (ref 4.0–10.5)

## 2014-03-09 LAB — COMPREHENSIVE METABOLIC PANEL
ALT: 13 U/L (ref 0–53)
AST: 17 U/L (ref 0–37)
Albumin: 3.9 g/dL (ref 3.5–5.2)
Alkaline Phosphatase: 59 U/L (ref 39–117)
Anion gap: 13 (ref 5–15)
BUN: 18 mg/dL (ref 6–23)
CALCIUM: 9.6 mg/dL (ref 8.4–10.5)
CO2: 22 meq/L (ref 19–32)
CREATININE: 1.08 mg/dL (ref 0.50–1.35)
Chloride: 106 mEq/L (ref 96–112)
GFR calc Af Amer: 72 mL/min — ABNORMAL LOW (ref >90)
GFR, EST NON AFRICAN AMERICAN: 62 mL/min — AB (ref >90)
Glucose, Bld: 89 mg/dL (ref 70–99)
Potassium: 4.6 mEq/L (ref 3.7–5.3)
Sodium: 141 mEq/L (ref 137–147)
Total Bilirubin: 0.5 mg/dL (ref 0.3–1.2)
Total Protein: 6.9 g/dL (ref 6.0–8.3)

## 2014-03-09 LAB — BLOOD GAS, ARTERIAL
Acid-base deficit: 0 mmol/L (ref 0.0–2.0)
Bicarbonate: 23.8 mEq/L (ref 20.0–24.0)
DRAWN BY: 206361
FIO2: 0.21 %
O2 SAT: 97.6 %
Patient temperature: 98.6
TCO2: 25 mmol/L (ref 0–100)
pCO2 arterial: 37.1 mmHg (ref 35.0–45.0)
pH, Arterial: 7.424 (ref 7.350–7.450)
pO2, Arterial: 89.3 mmHg (ref 80.0–100.0)

## 2014-03-09 LAB — APTT: APTT: 33 s (ref 24–37)

## 2014-03-09 LAB — URINE MICROSCOPIC-ADD ON

## 2014-03-09 LAB — URINALYSIS, ROUTINE W REFLEX MICROSCOPIC
BILIRUBIN URINE: NEGATIVE
GLUCOSE, UA: NEGATIVE mg/dL
KETONES UR: NEGATIVE mg/dL
Leukocytes, UA: NEGATIVE
NITRITE: NEGATIVE
PH: 6 (ref 5.0–8.0)
PROTEIN: NEGATIVE mg/dL
Specific Gravity, Urine: 1.021 (ref 1.005–1.030)
Urobilinogen, UA: 0.2 mg/dL (ref 0.0–1.0)

## 2014-03-09 LAB — TYPE AND SCREEN
ABO/RH(D): A POS
Antibody Screen: NEGATIVE

## 2014-03-09 LAB — SURGICAL PCR SCREEN
MRSA, PCR: NEGATIVE
Staphylococcus aureus: NEGATIVE

## 2014-03-09 MED ORDER — CHLORHEXIDINE GLUCONATE CLOTH 2 % EX PADS: 6.0000 | MEDICATED_PAD | Freq: Once | CUTANEOUS | Status: DC

## 2014-03-09 NOTE — Pre-Procedure Instructions (Signed)
Jonathan Arroyo  03/09/2014   Your procedure is scheduled on:  03/14/14  Report to [facility] Admitting at 630 AM.  Call this number if you have problems the morning of surgery: [phone]   Remember:   Do not eat food or drink liquids after midnight.   Take these medicines the morning of surgery with A SIP OF WATER: carvedilol,proscar,protonix,clonidine   Do not wear jewelry, make-up or nail polish.  Do not wear lotions, powders, or perfumes. You may wear deodorant.  Do not shave 48 hours prior to surgery. Men may shave face and neck.  Do not bring valuables to the hospital.  [facility] is not responsible                  for any belongings or valuables.               Contacts, dentures or bridgework may not be worn into surgery.  Leave suitcase in the car. After surgery it may be brought to your room.  For patients admitted to the hospital, discharge time is determined by your                treatment team.               Patients discharged the day of surgery will not be allowed to drive  home.  Name and phone number of your driver: family  Special Instructions: Shower using CHG 2 nights before surgery and the night before surgery.  If you shower the day of surgery use CHG.  Use special wash - you have one bottle of CHG for all showers.  You should use approximately 1/3 of the bottle for each shower.   Please read over the following fact sheets that you were given: Pain Booklet, Coughing and Deep Breathing, Blood Transfusion Information, MRSA Information and Surgical Site Infection Prevention

## 2014-03-12 NOTE — Progress Notes (Signed)
Anesthesia Chart Review:  Pt is 80 year old Jonathan Arroyo scheduled for abdominal aortic endovascular stent graft on 03/14/2014 with Dr. Oneida Alar.   PMH: HTN, CHF, CAD (s/p CABG redo 2005, DES to CX and LM 03/2013), MI, AAA, ischemic cardiomyopathy, DVT, hyperlipidemia  Medications include: ASA, plavix, carvedilol, lisinopril, nitroglycerin, clonidine, buspar, ambien, proscar, simvistatin  Preoperative labs reviewed.    Chest x-ray reviewed. No radiographic evidence of acute cardiopulmonary disease. Surgical changes of prior median sternotomy and CABG, as well as PTCA and stenting.  EKG 9/Jonathan/2015: Sinus rhythm with marked sinus arrhythmia, nonspecific T wave abnormality.   Cardiologist is Dr. Angelena Form, last seen on 9/Jonathan/2015 for routine follow up.   From Dr. Camillia Herter notes: Redo CABG 07/04/03 with right radial graft to PDA, SVG to Circumflex. His LIMA to LAD was patent by cath. He was admitted November 2014 with chest pain c/w angina. Cardiac cath 04/10/13 and found to have patent LIMA to LAD, patent free radial graft to PDA but occluded SVG to Circumflex system with 99% distal left main stenosis supplying the Circumflex as well as severe distal Circumflex stenosis. A 2.75 x 20 mm Promus Premier DES was deployed in the distal Circumflex extending into the second OM branch. This was post-dilated with a 2.75 x 15 mm [place] balloon. A 3.0 x 16 mm Promus Premier DES was deployed in the distal left main extending into the proximal Circumflex. This stent was post-dilated with a 3.5 x 8 mm [place] balloon x 2.  2D echo 07/14/2013:  - Left ventricle: Septal hypokinesis The cavity size was mildly dilated. Wall thickness was increased in a pattern of mild LVH. The estimated ejection fraction was 50%. - Aortic valve: Mild regurgitation. - Left atrium: The atrium was moderately dilated. - Atrial septum: No defect or patent foramen ovale was identified.  Note on 02/22/14 by Virgina Jock notes that Dr. Oneida Alar spoke with Dr.  Angelena Form about upcoming surgery and that Dr. Angelena Form would prefer pt to continue plavix and aspirin if possible.   If no changes, I anticipate pt can proceed with surgery as scheduled.   Willeen Cass, FNP-BC [facility] Stay [facility]/Anesthesiology Phone: [phone] 03/12/2014 3:44 PM

## 2014-03-13 MED ORDER — DEXTROSE 5 % IV SOLN
1.5000 g | INTRAVENOUS | Status: DC
  Filled 2014-03-13: qty 1.5

## 2014-03-13 MED ORDER — SODIUM CHLORIDE 0.9 % IV SOLN
INTRAVENOUS | Status: DC
  Administered 2014-03-14: 12:00:00 via INTRAVENOUS

## 2014-03-14 ENCOUNTER — Inpatient Hospital Stay (HOSPITAL_COMMUNITY)
Admission: RE | Admit: 2014-03-14 | Discharge: 2014-03-15 | DRG: 269 | Disposition: A | Discharge status: Home / Self Care | Payer: Medicare Other | Source: Ambulatory Visit | Attending: Vascular Surgery | Admitting: Vascular Surgery

## 2014-03-14 ENCOUNTER — Inpatient Hospital Stay (HOSPITAL_COMMUNITY): Payer: Medicare Other | Admitting: Anesthesiology

## 2014-03-14 ENCOUNTER — Encounter (HOSPITAL_COMMUNITY): Payer: Self-pay | Admitting: *Deleted

## 2014-03-14 ENCOUNTER — Inpatient Hospital Stay (HOSPITAL_COMMUNITY): Payer: Medicare Other

## 2014-03-14 ENCOUNTER — Encounter (HOSPITAL_COMMUNITY): Payer: Medicare Other | Admitting: Emergency Medicine

## 2014-03-14 ENCOUNTER — Encounter (HOSPITAL_COMMUNITY)
Admission: RE | Disposition: A | Discharge status: Home / Self Care | Payer: Self-pay | Source: Ambulatory Visit | Attending: Vascular Surgery

## 2014-03-14 DIAGNOSIS — D62 Acute posthemorrhagic anemia: Secondary | ICD-10-CM | POA: Diagnosis not present

## 2014-03-14 DIAGNOSIS — I701 Atherosclerosis of renal artery: Secondary | ICD-10-CM | POA: Diagnosis present

## 2014-03-14 DIAGNOSIS — Z7982 Long term (current) use of aspirin: Secondary | ICD-10-CM | POA: Diagnosis not present

## 2014-03-14 DIAGNOSIS — Z955 Presence of coronary angioplasty implant and graft: Secondary | ICD-10-CM

## 2014-03-14 DIAGNOSIS — I714 Abdominal aortic aneurysm, without rupture, unspecified: Secondary | ICD-10-CM | POA: Diagnosis present

## 2014-03-14 DIAGNOSIS — R200 Anesthesia of skin: Secondary | ICD-10-CM | POA: Diagnosis present

## 2014-03-14 DIAGNOSIS — Z951 Presence of aortocoronary bypass graft: Secondary | ICD-10-CM | POA: Diagnosis not present

## 2014-03-14 DIAGNOSIS — I2510 Atherosclerotic heart disease of native coronary artery without angina pectoris: Secondary | ICD-10-CM | POA: Diagnosis present

## 2014-03-14 DIAGNOSIS — Z9889 Other specified postprocedural states: Secondary | ICD-10-CM

## 2014-03-14 DIAGNOSIS — I713 Abdominal aortic aneurysm, ruptured: Secondary | ICD-10-CM

## 2014-03-14 DIAGNOSIS — Z8679 Personal history of other diseases of the circulatory system: Secondary | ICD-10-CM

## 2014-03-14 HISTORY — PX: ABDOMINAL AORTIC ENDOVASCULAR STENT GRAFT: SHX5707

## 2014-03-14 LAB — BASIC METABOLIC PANEL
Anion gap: 10 (ref 5–15)
BUN: 15 mg/dL (ref 6–23)
CALCIUM: 8.7 mg/dL (ref 8.4–10.5)
CO2: 24 mEq/L (ref 19–32)
Chloride: 105 mEq/L (ref 96–112)
Creatinine, Ser: 0.99 mg/dL (ref 0.50–1.35)
GFR, EST AFRICAN AMERICAN: 86 mL/min — AB (ref >90)
GFR, EST NON AFRICAN AMERICAN: 74 mL/min — AB (ref >90)
GLUCOSE: 93 mg/dL (ref 70–99)
Potassium: 4.1 mEq/L (ref 3.7–5.3)
SODIUM: 139 meq/L (ref 137–147)

## 2014-03-14 LAB — CBC
HCT: 33.1 % — ABNORMAL LOW (ref 39.0–52.0)
Hemoglobin: 11.4 g/dL — ABNORMAL LOW (ref 13.0–17.0)
MCH: 32 pg (ref 26.0–34.0)
MCHC: 34.4 g/dL (ref 30.0–36.0)
MCV: 93 fL (ref 78.0–100.0)
Platelets: 123 10*3/uL — ABNORMAL LOW (ref 150–400)
RBC: 3.56 MIL/uL — ABNORMAL LOW (ref 4.22–5.81)
RDW: 13.2 % (ref 11.5–15.5)
WBC: 7.5 10*3/uL (ref 4.0–10.5)

## 2014-03-14 LAB — PROTIME-INR
INR: 1.37 (ref 0.00–1.49)
PROTHROMBIN TIME: 17 s — AB (ref 11.6–15.2)

## 2014-03-14 LAB — APTT: aPTT: 42 seconds — ABNORMAL HIGH (ref 24–37)

## 2014-03-14 LAB — MAGNESIUM: Magnesium: 1.9 mg/dL (ref 1.5–2.5)

## 2014-03-14 SURGERY — INSERTION, ENDOVASCULAR STENT GRAFT, AORTA, ABDOMINAL
Anesthesia: General | Site: Abdomen

## 2014-03-14 MED ORDER — NEOSTIGMINE METHYLSULFATE 10 MG/10ML IV SOLN
INTRAVENOUS | Status: AC
  Filled 2014-03-14: qty 1

## 2014-03-14 MED ORDER — ONDANSETRON HCL 4 MG/2ML IJ SOLN
INTRAMUSCULAR | Status: DC | PRN
  Administered 2014-03-14: 4 mg via INTRAVENOUS

## 2014-03-14 MED ORDER — FINASTERIDE 5 MG PO TABS
5.0000 mg | ORAL_TABLET | Freq: Every day | ORAL | Status: DC
  Administered 2014-03-14 – 2014-03-15 (×2): 5 mg via ORAL
  Filled 2014-03-14 (×2): qty 1

## 2014-03-14 MED ORDER — LIDOCAINE HCL 4 % MT SOLN
OROMUCOSAL | Status: DC | PRN
  Administered 2014-03-14: 4 mL via TOPICAL

## 2014-03-14 MED ORDER — SODIUM CHLORIDE 0.9 % IV SOLN
INTRAVENOUS | Status: DC
  Administered 2014-03-14: 15:00:00 via INTRAVENOUS

## 2014-03-14 MED ORDER — PROTAMINE SULFATE 10 MG/ML IV SOLN
INTRAVENOUS | Status: DC | PRN
  Administered 2014-03-14: 50 mg via INTRAVENOUS

## 2014-03-14 MED ORDER — DEXTROSE 5 % IV SOLN
1.5000 g | Freq: Two times a day (BID) | INTRAVENOUS | Status: AC
  Administered 2014-03-14 – 2014-03-15 (×2): 1.5 g via INTRAVENOUS
  Filled 2014-03-14 (×2): qty 1.5

## 2014-03-14 MED ORDER — LACTATED RINGERS IV SOLN
INTRAVENOUS | Status: DC | PRN
  Administered 2014-03-14: 08:00:00 via INTRAVENOUS

## 2014-03-14 MED ORDER — ASPIRIN EC 81 MG PO TBEC
81.0000 mg | DELAYED_RELEASE_TABLET | Freq: Every day | ORAL | Status: DC
  Filled 2014-03-14: qty 1

## 2014-03-14 MED ORDER — 0.9 % SODIUM CHLORIDE (POUR BTL) OPTIME
TOPICAL | Status: DC | PRN
  Administered 2014-03-14: 1000 mL

## 2014-03-14 MED ORDER — LIDOCAINE HCL (CARDIAC) 20 MG/ML IV SOLN
INTRAVENOUS | Status: DC | PRN
  Administered 2014-03-14: 100 mg via INTRAVENOUS

## 2014-03-14 MED ORDER — GUAIFENESIN-DM 100-10 MG/5ML PO SYRP: 15.0000 mL | ORAL_SOLUTION | ORAL | Status: DC | PRN

## 2014-03-14 MED ORDER — LACTATED RINGERS IV SOLN
INTRAVENOUS | Status: DC | PRN
  Administered 2014-03-14 (×2): via INTRAVENOUS

## 2014-03-14 MED ORDER — ONDANSETRON HCL 4 MG/2ML IJ SOLN: 4.0000 mg | Freq: Once | INTRAMUSCULAR | Status: DC | PRN

## 2014-03-14 MED ORDER — SIMVASTATIN 20 MG PO TABS
20.0000 mg | ORAL_TABLET | Freq: Every day | ORAL | Status: DC
  Administered 2014-03-14: 20 mg via ORAL
  Filled 2014-03-14 (×2): qty 1

## 2014-03-14 MED ORDER — ROCURONIUM BROMIDE 50 MG/5ML IV SOLN
INTRAVENOUS | Status: AC
  Filled 2014-03-14: qty 1

## 2014-03-14 MED ORDER — SODIUM CHLORIDE 0.9 % IJ SOLN
INTRAMUSCULAR | Status: AC
  Filled 2014-03-14: qty 10

## 2014-03-14 MED ORDER — MIDAZOLAM HCL 2 MG/2ML IJ SOLN
INTRAMUSCULAR | Status: AC
  Filled 2014-03-14: qty 2

## 2014-03-14 MED ORDER — CLONIDINE HCL 0.1 MG PO TABS
0.1000 mg | ORAL_TABLET | Freq: Two times a day (BID) | ORAL | Status: DC
  Administered 2014-03-14: 0.1 mg via ORAL
  Filled 2014-03-14 (×3): qty 1

## 2014-03-14 MED ORDER — NITROGLYCERIN 0.4 MG SL SUBL: 0.4000 mg | SUBLINGUAL_TABLET | SUBLINGUAL | Status: DC | PRN

## 2014-03-14 MED ORDER — ENOXAPARIN SODIUM 40 MG/0.4ML ~~LOC~~ SOLN
40.0000 mg | SUBCUTANEOUS | Status: DC
  Administered 2014-03-15: 40 mg via SUBCUTANEOUS
  Filled 2014-03-14: qty 0.4

## 2014-03-14 MED ORDER — MORPHINE SULFATE 2 MG/ML IJ SOLN: 2.0000 mg | INTRAMUSCULAR | Status: DC | PRN

## 2014-03-14 MED ORDER — GLYCOPYRROLATE 0.2 MG/ML IJ SOLN
INTRAMUSCULAR | Status: AC
  Filled 2014-03-14: qty 2

## 2014-03-14 MED ORDER — GLYCOPYRROLATE 0.2 MG/ML IJ SOLN
INTRAMUSCULAR | Status: DC | PRN
  Administered 2014-03-14: .4 mg via INTRAVENOUS
  Administered 2014-03-14: 0.2 mg via INTRAVENOUS

## 2014-03-14 MED ORDER — SODIUM CHLORIDE 0.9 % IV SOLN: 500.0000 mL | Freq: Once | INTRAVENOUS | Status: AC | PRN

## 2014-03-14 MED ORDER — HYDRALAZINE HCL 20 MG/ML IJ SOLN: 5.0000 mg | INTRAMUSCULAR | Status: DC | PRN

## 2014-03-14 MED ORDER — NEOSTIGMINE METHYLSULFATE 10 MG/10ML IV SOLN
INTRAVENOUS | Status: DC | PRN
  Administered 2014-03-14: 3 mg via INTRAVENOUS

## 2014-03-14 MED ORDER — ONDANSETRON HCL 4 MG/2ML IJ SOLN
INTRAMUSCULAR | Status: AC
  Filled 2014-03-14: qty 2

## 2014-03-14 MED ORDER — ROCURONIUM BROMIDE 100 MG/10ML IV SOLN
INTRAVENOUS | Status: DC | PRN
  Administered 2014-03-14: 30 mg via INTRAVENOUS
  Administered 2014-03-14 (×2): 10 mg via INTRAVENOUS

## 2014-03-14 MED ORDER — LISINOPRIL 10 MG PO TABS
10.0000 mg | ORAL_TABLET | Freq: Two times a day (BID) | ORAL | Status: DC
  Administered 2014-03-14 – 2014-03-15 (×2): 10 mg via ORAL
  Filled 2014-03-14 (×3): qty 1

## 2014-03-14 MED ORDER — BUSPIRONE HCL 10 MG PO TABS
10.0000 mg | ORAL_TABLET | Freq: Two times a day (BID) | ORAL | Status: DC | PRN
  Filled 2014-03-14: qty 1

## 2014-03-14 MED ORDER — ACETAMINOPHEN 325 MG PO TABS: 325.0000 mg | ORAL_TABLET | ORAL | Status: DC | PRN

## 2014-03-14 MED ORDER — POTASSIUM CHLORIDE CRYS ER 20 MEQ PO TBCR: 20.0000 meq | EXTENDED_RELEASE_TABLET | Freq: Every day | ORAL | Status: DC | PRN

## 2014-03-14 MED ORDER — PROPOFOL 10 MG/ML IV BOLUS
INTRAVENOUS | Status: AC
  Filled 2014-03-14: qty 20

## 2014-03-14 MED ORDER — METOPROLOL TARTRATE 1 MG/ML IV SOLN: 2.0000 mg | INTRAVENOUS | Status: DC | PRN

## 2014-03-14 MED ORDER — EPHEDRINE SULFATE 50 MG/ML IJ SOLN
INTRAMUSCULAR | Status: AC
Start: 2014-03-14 — End: 2014-03-14
  Filled 2014-03-14: qty 1

## 2014-03-14 MED ORDER — LIDOCAINE HCL (CARDIAC) 20 MG/ML IV SOLN
INTRAVENOUS | Status: AC
  Filled 2014-03-14: qty 5

## 2014-03-14 MED ORDER — CLOPIDOGREL BISULFATE 75 MG PO TABS
75.0000 mg | ORAL_TABLET | Freq: Every day | ORAL | Status: DC
  Filled 2014-03-14: qty 1

## 2014-03-14 MED ORDER — ALBUMIN HUMAN 5 % IV SOLN
INTRAVENOUS | Status: DC | PRN
  Administered 2014-03-14: 11:00:00 via INTRAVENOUS

## 2014-03-14 MED ORDER — IODIXANOL 320 MG/ML IV SOLN
INTRAVENOUS | Status: DC | PRN
  Administered 2014-03-14: 70.1 mL via INTRAVENOUS

## 2014-03-14 MED ORDER — POLYETHYLENE GLYCOL 3350 17 G PO PACK
17.0000 g | PACK | Freq: Every day | ORAL | Status: DC | PRN
  Filled 2014-03-14: qty 1

## 2014-03-14 MED ORDER — HYDROCODONE-ACETAMINOPHEN 5-325 MG PO TABS
1.0000 | ORAL_TABLET | Freq: Four times a day (QID) | ORAL | Status: DC | PRN
  Administered 2014-03-14 – 2014-03-15 (×3): 1 via ORAL
  Filled 2014-03-14 (×3): qty 1

## 2014-03-14 MED ORDER — FENTANYL CITRATE 0.05 MG/ML IJ SOLN
INTRAMUSCULAR | Status: DC | PRN
  Administered 2014-03-14 (×2): 50 ug via INTRAVENOUS
  Administered 2014-03-14: 25 ug via INTRAVENOUS
  Administered 2014-03-14: 50 ug via INTRAVENOUS
  Administered 2014-03-14: 25 ug via INTRAVENOUS

## 2014-03-14 MED ORDER — FENTANYL CITRATE 0.05 MG/ML IJ SOLN
INTRAMUSCULAR | Status: AC
  Filled 2014-03-14: qty 5

## 2014-03-14 MED ORDER — HYDROMORPHONE HCL 1 MG/ML IJ SOLN
INTRAMUSCULAR | Status: AC
  Administered 2014-03-14: 0.5 mg via INTRAVENOUS
  Filled 2014-03-14: qty 1

## 2014-03-14 MED ORDER — ZOLPIDEM TARTRATE 5 MG PO TABS
5.0000 mg | ORAL_TABLET | Freq: Every evening | ORAL | Status: DC | PRN
  Administered 2014-03-14: 5 mg via ORAL
  Filled 2014-03-14: qty 1

## 2014-03-14 MED ORDER — BISACODYL 10 MG RE SUPP: 10.0000 mg | Freq: Every day | RECTAL | Status: DC | PRN

## 2014-03-14 MED ORDER — SODIUM CHLORIDE 0.9 % IR SOLN
Status: DC | PRN
  Administered 2014-03-14 (×3)

## 2014-03-14 MED ORDER — PROPOFOL 10 MG/ML IV BOLUS
INTRAVENOUS | Status: DC | PRN
  Administered 2014-03-14: 110 mg via INTRAVENOUS

## 2014-03-14 MED ORDER — PHENYLEPHRINE HCL 10 MG/ML IJ SOLN
10.0000 mg | INTRAVENOUS | Status: DC | PRN
  Administered 2014-03-14: 10 ug/min via INTRAVENOUS

## 2014-03-14 MED ORDER — PHENOL 1.4 % MT LIQD: 1.0000 | OROMUCOSAL | Status: DC | PRN

## 2014-03-14 MED ORDER — DOCUSATE SODIUM 100 MG PO CAPS
100.0000 mg | ORAL_CAPSULE | Freq: Every day | ORAL | Status: DC
  Filled 2014-03-14: qty 1

## 2014-03-14 MED ORDER — HEPARIN SODIUM (PORCINE) 1000 UNIT/ML IJ SOLN
INTRAMUSCULAR | Status: DC | PRN
  Administered 2014-03-14: 5000 [IU] via INTRAVENOUS
  Administered 2014-03-14: 9000 [IU] via INTRAVENOUS

## 2014-03-14 MED ORDER — IODIXANOL 320 MG/ML IV SOLN
INTRAVENOUS | Status: DC | PRN
  Administered 2014-03-14: 25 mL via INTRAVENOUS

## 2014-03-14 MED ORDER — HYDROMORPHONE HCL 1 MG/ML IJ SOLN
0.2500 mg | INTRAMUSCULAR | Status: DC | PRN
  Administered 2014-03-14 (×2): 0.5 mg via INTRAVENOUS

## 2014-03-14 MED ORDER — ASPIRIN 81 MG PO TABS: 81.0000 mg | ORAL_TABLET | Freq: Every day | ORAL | Status: DC

## 2014-03-14 MED ORDER — PANTOPRAZOLE SODIUM 40 MG PO TBEC
40.0000 mg | DELAYED_RELEASE_TABLET | Freq: Every day | ORAL | Status: DC
  Administered 2014-03-14: 40 mg via ORAL
  Filled 2014-03-14 (×2): qty 1

## 2014-03-14 MED ORDER — LABETALOL HCL 5 MG/ML IV SOLN: 10.0000 mg | INTRAVENOUS | Status: DC | PRN

## 2014-03-14 MED ORDER — ACETAMINOPHEN 650 MG RE SUPP
325.0000 mg | RECTAL | Status: DC | PRN
Start: 2014-03-14 — End: 2014-03-15

## 2014-03-14 MED ORDER — PHENYLEPHRINE 40 MCG/ML (10ML) SYRINGE FOR IV PUSH (FOR BLOOD PRESSURE SUPPORT)
PREFILLED_SYRINGE | INTRAVENOUS | Status: AC
  Filled 2014-03-14: qty 10

## 2014-03-14 MED ORDER — ONDANSETRON HCL 4 MG/2ML IJ SOLN: 4.0000 mg | Freq: Four times a day (QID) | INTRAMUSCULAR | Status: DC | PRN

## 2014-03-14 MED ORDER — DEXTROSE 5 % IV SOLN
1.5000 g | INTRAVENOUS | Status: DC | PRN
  Administered 2014-03-14: 1.5 g via INTRAVENOUS

## 2014-03-14 MED ORDER — CARVEDILOL 3.125 MG PO TABS
3.1250 mg | ORAL_TABLET | Freq: Two times a day (BID) | ORAL | Status: DC
  Administered 2014-03-14 – 2014-03-15 (×2): 3.125 mg via ORAL
  Filled 2014-03-14 (×4): qty 1

## 2014-03-14 SURGICAL SUPPLY — 81 items
ADH SKN CLS APL DERMABOND .7 (GAUZE/BANDAGES/DRESSINGS) ×2
BAG BANDED W/RUBBER/TAPE 36X54 (MISCELLANEOUS) ×2 IMPLANT
BAG EQP BAND 135X91 W/RBR TAPE (MISCELLANEOUS) ×1
BAG SNAP BAND KOVER 36X36 (MISCELLANEOUS) ×2 IMPLANT
CANISTER SUCTION 2500CC (MISCELLANEOUS) ×2 IMPLANT
CATH BEACON 5.038 65CM KMP-01 (CATHETERS) ×1 IMPLANT
CATH OMNI FLUSH .035X70CM (CATHETERS) ×1 IMPLANT
CLIP TI MEDIUM 24 (CLIP) IMPLANT
CLIP TI MEDIUM 6 (CLIP) ×1 IMPLANT
CLIP TI WIDE RED SMALL 24 (CLIP) IMPLANT
CLIP TI WIDE RED SMALL 6 (CLIP) ×1 IMPLANT
COVER DOME SNAP 22 D (MISCELLANEOUS) ×2 IMPLANT
COVER MAYO STAND STRL (DRAPES) ×2 IMPLANT
COVER PROBE W GEL 5X96 (DRAPES) ×2 IMPLANT
COVER SURGICAL LIGHT HANDLE (MISCELLANEOUS) ×2 IMPLANT
DERMABOND ADVANCED (GAUZE/BANDAGES/DRESSINGS) ×2
DERMABOND ADVANCED .7 DNX12 (GAUZE/BANDAGES/DRESSINGS) ×1 IMPLANT
DEVICE CLOSURE PERCLS PRGLD 6F (VASCULAR PRODUCTS) IMPLANT
DEVICE TORQUE KENDALL .025-038 (MISCELLANEOUS) ×1 IMPLANT
DRAPE TABLE COVER HEAVY DUTY (DRAPES) ×2 IMPLANT
DRESSING OPSITE X SMALL 2X3 (GAUZE/BANDAGES/DRESSINGS) ×1 IMPLANT
DRSG TEGADERM 2-3/8X2-3/4 SM (GAUZE/BANDAGES/DRESSINGS) ×3 IMPLANT
DRYSEAL FLEXSHEATH 12FR 33CM (SHEATH) ×1
DRYSEAL FLEXSHEATH 16FR 33CM (SHEATH) ×1
ELECT CAUTERY BLADE 6.4 (BLADE) ×2 IMPLANT
ELECT REM PT RETURN 9FT ADLT (ELECTROSURGICAL) ×4
ELECTRODE REM PT RTRN 9FT ADLT (ELECTROSURGICAL) ×2 IMPLANT
EXCLUDER TNK LEG 23MX14X18 (Endovascular Graft) IMPLANT
EXCLUDER TRUNK LEG 23MX14X18 (Endovascular Graft) ×2 IMPLANT
GAUZE SPONGE 2X2 8PLY STRL LF (GAUZE/BANDAGES/DRESSINGS) IMPLANT
GAUZE SPONGE 4X4 16PLY XRAY LF (GAUZE/BANDAGES/DRESSINGS) ×1 IMPLANT
GLOVE BIO SURGEON STRL SZ7.5 (GLOVE) ×2 IMPLANT
GLOVE BIOGEL PI IND STRL 6.5 (GLOVE) IMPLANT
GLOVE BIOGEL PI INDICATOR 6.5 (GLOVE) ×1
GOWN STRL REUS W/ TWL LRG LVL3 (GOWN DISPOSABLE) ×3 IMPLANT
GOWN STRL REUS W/TWL LRG LVL3 (GOWN DISPOSABLE) ×6
GRAFT BALLN CATH 65CM (STENTS) IMPLANT
GUIDEWIRE ANGLED .035X150CM (WIRE) ×1 IMPLANT
KIT BASIN OR (CUSTOM PROCEDURE TRAY) ×2 IMPLANT
KIT ROOM TURNOVER OR (KITS) ×2 IMPLANT
LEG CONTRALATERAL 16X16X13.5 (Endovascular Graft) ×2 IMPLANT
NDL PERC 18GX7CM (NEEDLE) ×1 IMPLANT
NEEDLE PERC 18GX7CM (NEEDLE) ×2 IMPLANT
NS IRRIG 1000ML POUR BTL (IV SOLUTION) ×2 IMPLANT
PACK AORTA (CUSTOM PROCEDURE TRAY) ×2 IMPLANT
PAD ARMBOARD 7.5X6 YLW CONV (MISCELLANEOUS) ×4 IMPLANT
PENCIL BUTTON HOLSTER BLD 10FT (ELECTRODE) IMPLANT
PERCLOSE PROGLIDE 6F (VASCULAR PRODUCTS) ×18
PROTECTION STATION PRESSURIZED (MISCELLANEOUS) ×2
SHEATH AVANTI 11CM 8FR (MISCELLANEOUS) ×1 IMPLANT
SHEATH BRITE TIP 8FR 23CM (MISCELLANEOUS) ×1 IMPLANT
SHEATH DRYSEAL FLEX 12FR 33CM (SHEATH) IMPLANT
SHEATH DRYSEAL FLEX 16FR 33CM (SHEATH) IMPLANT
SPONGE GAUZE 2X2 STER 10/PKG (GAUZE/BANDAGES/DRESSINGS) ×1
SPONGE SURGIFOAM ABS GEL 100 (HEMOSTASIS) IMPLANT
STAPLER VISISTAT 35W (STAPLE) IMPLANT
STATION PROTECTION PRESSURIZED (MISCELLANEOUS) IMPLANT
STENT GRAFT BALLN CATH 65CM (STENTS) ×1
STENT GRAFT CONTRALAT 16X13.5 (Endovascular Graft) IMPLANT
STOPCOCK MORSE 400PSI 3WAY (MISCELLANEOUS) ×2 IMPLANT
SUT PROLENE 5 0 C 1 24 (SUTURE) ×4 IMPLANT
SUT SILK 2 0 (SUTURE) ×2
SUT SILK 2-0 18XBRD TIE 12 (SUTURE) IMPLANT
SUT SILK 3 0 (SUTURE) ×2
SUT SILK 3-0 18XBRD TIE 12 (SUTURE) IMPLANT
SUT VIC AB 2-0 CT1 27 (SUTURE) ×2
SUT VIC AB 2-0 CT1 TAPERPNT 27 (SUTURE) IMPLANT
SUT VIC AB 3-0 SH 27 (SUTURE) ×4
SUT VIC AB 3-0 SH 27X BRD (SUTURE) IMPLANT
SUT VICRYL 4-0 PS2 18IN ABS (SUTURE) ×4 IMPLANT
SYR 20CC LL (SYRINGE) ×4 IMPLANT
SYR 30ML LL (SYRINGE) ×2 IMPLANT
SYR 5ML LL (SYRINGE) ×2 IMPLANT
SYR MEDRAD MARK V 150ML (SYRINGE) ×2 IMPLANT
SYRINGE 10CC LL (SYRINGE) ×6 IMPLANT
TOWEL OR 17X24 6PK STRL BLUE (TOWEL DISPOSABLE) ×5 IMPLANT
TOWEL OR 17X26 10 PK STRL BLUE (TOWEL DISPOSABLE) ×4 IMPLANT
TRAY FOLEY CATH 16FRSI W/METER (SET/KITS/TRAYS/PACK) ×2 IMPLANT
TUBING HIGH PRESSURE 120CM (CONNECTOR) ×2 IMPLANT
WIRE AMPLATZ SS-J .035X180CM (WIRE) ×4 IMPLANT
WIRE BENTSON .035X145CM (WIRE) ×2 IMPLANT

## 2014-03-14 NOTE — H&P (View-Only) (Signed)
Established Abdominal Aortic Aneurysm  History of Present Illness  The patient is a 80 y.o. (09-02-1929) male who presents with chief complaint: follow up for AAA.  Previous studies demonstrate an AAA, measuring 4.8 cm. Today, he presents with a AAA measuring 5.3 on CTA abdomen/pelvis from [facility] on 02/09/14.  The patient does not have back or abdominal pain.  The patient is not a smoker. He also has a known left popliteal artery aneurysm which was last sized at 1 cm March 2015. Right popliteal artery was normal diameter. He also has a history of a renal artery duplex scan showed a left renal artery stenosis. He is currently on 2 blood pressure medications with no evidence of decline in renal function.  He has a history of CAD s/p CABG and redo-CABG and PCI. He was last seen by his cardiologist Dr. Angelena Form on 02/02/14. He is currently on plavix and aspirin.    Current Outpatient Prescriptions on File Prior to Visit  Medication Sig Dispense Refill  . aspirin 81 MG tablet Take 81 mg by mouth daily.      . busPIRone (BUSPAR) 10 MG tablet Take 10 mg by mouth 2 (two) times daily as needed (anxiety).       . carvedilol (COREG) 3.125 MG tablet Take 1 tablet (3.125 mg total) by mouth 2 (two) times daily with a meal.  60 tablet  6  . cloNIDine (CATAPRES) 0.2 MG tablet Take 0.1 mg by mouth 2 (two) times daily.       . clopidogrel (PLAVIX) 75 MG tablet Take 75 mg by mouth daily.        . finasteride (PROSCAR) 5 MG tablet Take 5 mg by mouth daily.        Marland Kitchen lisinopril (PRINIVIL,ZESTRIL) 20 MG tablet Take 20 mg by mouth 2 (two) times daily.        . nitroGLYCERIN (NITROSTAT) 0.4 MG SL tablet Place 0.4 mg under the tongue every 5 (five) minutes as needed (MAX 3 TABLETS). For chest pain      . pantoprazole (PROTONIX) 40 MG tablet Take 40 mg by mouth daily.       . polyethylene glycol (MIRALAX / GLYCOLAX) packet Take 17 g by mouth daily as needed for mild constipation.      . sertraline (ZOLOFT) 25  MG tablet Take 25 mg by mouth daily.      . simvastatin (ZOCOR) 40 MG tablet Take 40 mg by mouth at bedtime.        Marland Kitchen zolpidem (AMBIEN) 10 MG tablet Take 10 mg by mouth at bedtime as needed. For insomnia       No current facility-administered medications on file prior to visit.     Physical Examination  Filed Vitals:   02/22/14 1352  BP: 150/69  Pulse: 51  Height: 6\' 2"  (1.88 m)  Weight: 183 lb (83.008 kg)  SpO2: 98%   Body mass index is 23.49 kg/(m^2).  General: A&O x 3, WDWN male in NAD  Pulmonary: Sym exp, good air movt, CTAB, no rales, rhonchi, & wheezing   Cardiac: RRR, Nl S1, S2, no Murmurs, rubs or gallops, no carotid bruits.   Vascular: Vessel Right Left  Radial 2+ 2+  Aorta Palpable N/A  Femoral 2+ 2+  Popliteal 3+ 3+  PT Not palpable Not palpable  DP 1+ 2+   Gastrointestinal: soft, NTND, -G/R, - HSM, + pulsatile abdominal mass just below the left costal margin Musculoskeletal: M/S 5/5 throughout.  Extremities  without ischemic changes.  Neurologic:  Pain and light touch intact in extremities. Motor exam as listed above  Non-Invasive Vascular Imaging  AAA Duplex  Previous size: 4.8 cm (Date: 07/27/2013)  CTA abdomen/pelvis  Current size:  5.3 cm (Date: 02/09/14)  Medical Decision Making  The patient is a 80 y.o. male who presents with: asymptomatic AAA with increasing size. Previous measurement on ultrasound was 4.8 cm on 07/27/13 that is now 5.3 cm on CTA.   Left renal artery stenosis present in the 60-99% range. Small left popliteal aneurysm not requiring intervention.    Based on this patient's exam and diagnostic studies, he is a candidate for endovascular AAA repair. He will inevitably need surgical repair of his aneurysm. The patient would like a few days to think before proceeding with surgery.   The threshold for repair is AAA size > 5.5 cm, growth > 1 cm/yr, and symptomatic status.  If the patient decides not to proceed with surgery, he will  follow up in 6 months for repeat duplex.   He will follow up in one year for popliteal artery duplex and renal artery duplex. He is currently asymptomatic.   Thank you for allowing [place] to participate in this patient's care.  Virgina Jock, PA-C [facility] Office: [phone] Pager: [phone]  02/22/2014, 2:36 PM   This patient was seen in conjunction with Dr. Oneida Alar  History and exam details as above. Patient's infrarenal abdominal aortic aneurysm is growing over time. It is now 5.3 cm in diameter. I discussed with the patient fixing this at this point with a percutaneous aneurysm stent graft. CT measurement showed he does have an adequate neck length for a stent graft and adequate access vessels. I also discussed with them continued observation with ultrasound but I believe the inevitably we're going to reach the threshold 5.5 cm sooner rather than later as the aneurysm continues to grow. I also discussed the case today with his cardiologist Dr. Julianne Handler. The patient does have a left main coronary stent but this is functionally for his circumflex artery as he also has a LIMA graft to the LAD which was widely patent. Dr. Julianne Handler would prefer for [place] to continue his Plavix and aspirin if possible. However, he stated that if we needed to be could stop his Plavix temporarily. The patient is going to consider whether or not he wishes to undergo repair and will call us back in a few days. If he decides to hold on repair at this point we'll repeat his duplex scan in 6 months.  Ruta Hinds, MD [facility] Office: [phone] Pager: [phone]

## 2014-03-14 NOTE — Op Note (Signed)
Procedure: Gore Excluder stent graft repair infrarenal AAA  Preop: AAA Postop: AAA  Asst: Silva Bandy PA-C  Operative findings:   #1 Right side Proglide closure   #2 23x14x18 cm main body Gore Excluder device delivered via a left femoral system   #3 16 x 13.5 cm left iliac limb contralateral (right)    #4 primary repair of left common femoral artery via cutdown              #5 type 2 endoleak at conclusion of case proximal lumbars   PROCEDURE DETAIL: After obtaining informed consent the patient was taken to the operating room. The patient was placed in supine position the operating room table. After induction of general anesthesia and endotracheal intubation a Foley catheter was placed. Next the patient was prepped and draped in usual sterile fashion from the nipples down to the knees. Ultrasound was used to identify the right common femoral artery as well as the femoral bifurcation. An 11 blade was used to make a small neck in the skin over the level of the right common femoral artery. An introducer needle was then used to cannulate the right common femoral artery without difficulty. A 0.035 Bentson wire was then threaded up into the abdominal aorta through the right femoral system. A short 9 French dilator was placed over the guidewire the right femoral system. This was used to dilate the tract. The dilator was then removed and a Proglide device inserted over the guidewire into the right femoral system and this was deployed at the 2:00 position. The Proglide device was then removed and an additional Proglide device was brought in operative field and deployed at the 10:00 position. The sutures were kept separate and tagged with suture tags. Next the short 9 French sheath was then placed back over the guidewire into the right femoral system and the dilator removed and the sheath thoroughly flushed with heparinized saline. Attention was then turned to the left groin. Again using ultrasound the left  common femoral artery was identified. The femoral bifurcation was also identified. A small nick was made in the skin with the 11 blade. A hemostat was used to dilate a tract down to the artery. An introducer needle was then used to cannulate the left common femoral artery without difficulty. A 0.035 Bentson wire was then threaded up into the abdominal aorta under fluoroscopic guidance. A 9 French dilator was then placed over the wire to dilate the tract. Two Proglide devices were again brought on operative field and these were fired sequentially in the 10:00 position followed by an additional Proglide at the 2:00 position. The Proglide delivery systems were removed and the long 9 French sheath placed back over the guidewire up to the level of the iliac of the aortic bifurcation.  At this point, a 5 [place] Omni flush catheter was placed over the guidewire on the right groin up into the abdominal aorta. An abdominal aortogram was obtained in the AP position to determine level of the left and right renal arteries. At this point a 23 x 14 x 18 Gore Excluder main body device was selected. The Bentson wire from the left groin was advanced up into the descending thoracic aorta over a KMP catheter and the Bentsen wire replaced with an 035 Amplatz wire. The patient was given 10000 units of heparin.  A 16 Fr dry seal sheath was placed over the amplatz wire intot the abdominal aorta.  The main body device was then placed over the  Amplatz wire in the right groin and advanced up to the level of the renal arteries. There was some intitial resistance at the skin level.  Magnified views of the renal arteries were performed to make sure that we were not covering these. The top portion of the stent graft was then deployed with the end of the stent just below the level of the left renal artery which was the lowest. The main body was delivered all the way down to the contralateral gate. Attention was then turned to the right groin.  The Omni flush catheter was pulled down over a guidewire and removed and the Bentson wire placed in position to cannulate the contralateral gate. The KMP catheter was placed back over the guidewire in the right femoral system. A 5 [place] KMP catheter was placed over this and this was used to selectively catheterize the contralateral gate and the guidewire was then advanced into the descending thoracic aorta. The main body portion of the gate was confirmed by twirling the pigtail catheter. The pigtail  catheter was then placed in a location so that we could use its markers to determine the exact length to the iliac bifurcation. An Amplatz wire was placed back through the pigtail catheter. A retrograde contrast angiogram was performed to determine the level of the right internal iliac artery and a 16 x 13.5 cm iliac limb was selected. The pigtail catheter was removed over the guidewire and the 16 x 13.5 cm limb advanced so there was full coverage of the long marker on the contralateral limb. This was then deployed in the usual fashion down to the iliac bifurcation. The delivery system was then removed. The remainder of the ipsilateral iliac limb was also deployed.  A retrograde contrast angiogram was also performed to make sure that the right iliac limb did not cover the right internal iliac artery.  Attention was then turned back to the left iliac system and a Gore aortic balloon placed over the wire up to the level of the proximal end of the stent and this was ballooned to profile. The limb attachment site was also ballooned as well as the distal attachment site. Attention was then turned to the right groin and the balloon was advanced over the guidewire on the right side and the distal attachment site as well as the midportion of iliac limb were also ballooned. The 5 [place] Omni flush catheter was then placed back to the guidewire on the right side and a completion arteriogram was obtained. This showed a type 1  leak proximally initially that resolved with reballooning. There was a type II endoleak from a pair of lumbars just below the neck that filled late. The internal external iliacs were patent.  The left and right renals were patent.  The left renal artery had a 50% stenosis was present preop.  At this point the Omni flush catheter was removed over a guidewire. All delivery devices were removed. We then proceeded to remove the large 16 French sheath from the right side with the guidewire in place. With pressure held above and below the insertion site the lateral Proglide closure was secured down.  An additional lateral Proglide was then placed over the guidewire and deployed.  There was poor hemostasis. I was unable to get an additional proglide to have purchase on the artery so the 9 Fr sheath was placed back over the wire and flushed with heparinized saline.The guidewire was removed from the left side. Attention was then turned to  the right groin. In similar fashion the 12 French sheath was removed and the guidewire left in place. The lateral Proglide and medial proglide were secured to obtain hemostasis.  There was good hemostasis and the Bentsen wire was removed from the right groin.   Attention was then turned back to the right groin and an oblique incision made incorporating the sheath.  This was extended down to the artery and it was dissected free proximal and distal to the sheath and vessel loops placed for proximal and distal control.  The proglide sutures had engaged but the artery was very calcified and the medial wall was not approximated.  The patient was given 5000 units of heparin.  The artery was controlled proximally and distally with Henley clamps.  The arteriotomy was then repaired with 4 interrupted 5 0 prolene sutures.  It was forebled backbled and thoroughly flushed.  Sutures were secured and clamps were removed with good hemostasis. The common femoral artery had a good pulse.   This  heparin was reversed with 50 mg of protamine at the end of the case. The right groin puncture site was then closed with a running 4-0 Vicryl subcuticular stitch.  The left groin incision was closed with 2 layers of 3 0 vicryl followed by a 4 0 vicryl subcuticular stitch and dermabond.  The patient tolerated procedure well and there were no complications. Instrument sponge and needle counts correct in the case. Patient was awakened in the operating room extubated and taken to the recovery room in stable condition.  Doppler signals right foot, palpable pulse left foot.  Ruta Hinds, MD [facility] Office: [phone] Pager: [phone]

## 2014-03-14 NOTE — Anesthesia Postprocedure Evaluation (Signed)
  Anesthesia Post-op Note  Patient: Jonathan Arroyo  Procedure(s) Performed: Procedure(s): ABDOMINAL AORTIC ENDOVASCULAR STENT GRAFT (N/A)  Patient Location: PACU  Anesthesia Type:General  Level of Consciousness: awake, alert  and oriented  Airway and Oxygen Therapy: Patient Spontanous Breathing and Patient connected to nasal cannula oxygen  Post-op Pain: none  Post-op Assessment: Post-op Vital signs reviewed, Patient's Cardiovascular Status Stable, Respiratory Function Stable, Patent Airway and No signs of Nausea or vomiting  Post-op Vital Signs: Reviewed and stable  Last Vitals:  Filed Vitals:   03/14/14 1406  BP: 140/61  Pulse: 54  Temp:   Resp: 14    Complications: No apparent anesthesia complications

## 2014-03-14 NOTE — Anesthesia Preprocedure Evaluation (Signed)
Anesthesia Evaluation  Patient identified by MRN, date of birth, ID band Patient awake    Reviewed: Allergy & Precautions, H&P , NPO status , Patient's Chart, lab work & pertinent test results  Airway        Dental   Pulmonary          Cardiovascular hypertension, + angina + CAD, + Past MI, + Cardiac Stents, + CABG, + Peripheral Vascular Disease and +CHF     Neuro/Psych  Neuromuscular disease    GI/Hepatic hiatal hernia, GERD-  ,  Endo/Other    Renal/GU Renal InsufficiencyRenal disease     Musculoskeletal   Abdominal   Peds  Hematology   Anesthesia Other Findings   Reproductive/Obstetrics                             Anesthesia Physical Anesthesia Plan  ASA: IV  Anesthesia Plan: General   Post-op Pain Management:    Induction: Intravenous  Airway Management Planned: Oral ETT  Additional Equipment: Arterial line and CVP  Intra-op Plan:   Post-operative Plan: Extubation in OR  Informed Consent: I have reviewed the patients History and Physical, chart, labs and discussed the procedure including the risks, benefits and alternatives for the proposed anesthesia with the patient or authorized representative who has indicated his/her understanding and acceptance.     Plan Discussed with: CRNA, Anesthesiologist and Surgeon  Anesthesia Plan Comments:         Anesthesia Quick Evaluation

## 2014-03-14 NOTE — Interval H&P Note (Signed)
History and Physical Interval Note:  03/14/2014 8:28 AM  Jonathan Arroyo  has presented today for surgery, with the diagnosis of Abdominal Aortic Aneurysm I71.4  The various methods of treatment have been discussed with the patient and family. After consideration of risks, benefits and other options for treatment, the patient has consented to  Procedure(s): ABDOMINAL AORTIC ENDOVASCULAR STENT GRAFT (N/A) as a surgical intervention .  The patient's history has been reviewed, patient examined, no change in status, stable for surgery.  I have reviewed the patient's chart and labs.  Questions were answered to the patient's satisfaction.     [name] E

## 2014-03-14 NOTE — Progress Notes (Signed)
Awake and alert.  Chronic numbness in right foot unchanged.  Minimal soreness  2+ left DP Doppler right foot No hematoma in groin  OOB later today Anticipate d/c in am  Ruta Hinds, MD [facility] Office: [phone] Pager: [phone]

## 2014-03-14 NOTE — Transfer of Care (Signed)
Immediate Anesthesia Transfer of Care Note  Patient: Jonathan Arroyo  Procedure(s) Performed: Procedure(s): ABDOMINAL AORTIC ENDOVASCULAR STENT GRAFT (N/A)  Patient Location: PACU  Anesthesia Type:General  Level of Consciousness: awake, alert  and patient cooperative  Airway & Oxygen Therapy: Patient Spontanous Breathing and Patient connected to nasal cannula oxygen  Post-op Assessment: Report given to PACU RN, Post -op Vital signs reviewed and stable and Patient moving all extremities  Post vital signs: Reviewed and stable  Complications: No apparent anesthesia complications

## 2014-03-14 NOTE — Progress Notes (Signed)
Utilization review completed.  

## 2014-03-15 ENCOUNTER — Telehealth: Payer: Self-pay | Admitting: Vascular Surgery

## 2014-03-15 ENCOUNTER — Other Ambulatory Visit: Payer: Self-pay | Admitting: *Deleted

## 2014-03-15 DIAGNOSIS — I714 Abdominal aortic aneurysm, without rupture, unspecified: Secondary | ICD-10-CM

## 2014-03-15 DIAGNOSIS — Z48812 Encounter for surgical aftercare following surgery on the circulatory system: Secondary | ICD-10-CM

## 2014-03-15 LAB — CBC
HEMATOCRIT: 32.3 % — AB (ref 39.0–52.0)
Hemoglobin: 11 g/dL — ABNORMAL LOW (ref 13.0–17.0)
MCH: 31.2 pg (ref 26.0–34.0)
MCHC: 34.1 g/dL (ref 30.0–36.0)
MCV: 91.5 fL (ref 78.0–100.0)
PLATELETS: 104 10*3/uL — AB (ref 150–400)
RBC: 3.53 MIL/uL — AB (ref 4.22–5.81)
RDW: 13.5 % (ref 11.5–15.5)
WBC: 8.5 10*3/uL (ref 4.0–10.5)

## 2014-03-15 LAB — BASIC METABOLIC PANEL
Anion gap: 11 (ref 5–15)
BUN: 12 mg/dL (ref 6–23)
CALCIUM: 8.6 mg/dL (ref 8.4–10.5)
CO2: 23 meq/L (ref 19–32)
CREATININE: 0.94 mg/dL (ref 0.50–1.35)
Chloride: 105 mEq/L (ref 96–112)
GFR calc Af Amer: 88 mL/min — ABNORMAL LOW (ref >90)
GFR calc non Af Amer: 76 mL/min — ABNORMAL LOW (ref >90)
Glucose, Bld: 108 mg/dL — ABNORMAL HIGH (ref 70–99)
Potassium: 4.1 mEq/L (ref 3.7–5.3)
Sodium: 139 mEq/L (ref 137–147)

## 2014-03-15 MED ORDER — SODIUM CHLORIDE 0.9 % IV BOLUS (SEPSIS)
500.0000 mL | Freq: Once | INTRAVENOUS | Status: AC
  Administered 2014-03-15: 500 mL via INTRAVENOUS

## 2014-03-15 MED ORDER — HYDROCODONE-ACETAMINOPHEN 5-325 MG PO TABS: 1.0000 | ORAL_TABLET | Freq: Four times a day (QID) | ORAL | Status: DC | PRN

## 2014-03-15 NOTE — Progress Notes (Signed)
Jonathan Arroyo to be D/C'd Home per MD order.  Discussed with the patient and all questions fully answered.    Medication List         AMBIEN 10 MG tablet  Generic drug:  zolpidem  Take 10 mg by mouth at bedtime as needed. For insomnia     aspirin 81 MG tablet  Take 81 mg by mouth daily.     busPIRone 10 MG tablet  Commonly known as:  BUSPAR  Take 10 mg by mouth 2 (two) times daily as needed (anxiety).     carvedilol 3.125 MG tablet  Commonly known as:  COREG  Take 1 tablet (3.125 mg total) by mouth 2 (two) times daily with a meal.     cloNIDine 0.2 MG tablet  Commonly known as:  CATAPRES  Take 0.1 mg by mouth 2 (two) times daily.     clopidogrel 75 MG tablet  Commonly known as:  PLAVIX  Take 75 mg by mouth daily.     finasteride 5 MG tablet  Commonly known as:  PROSCAR  Take 5 mg by mouth daily.     HYDROcodone-acetaminophen 5-325 MG per tablet  Commonly known as:  NORCO/VICODIN  Take 1 tablet by mouth every 6 (six) hours as needed for moderate pain.     lisinopril 20 MG tablet  Commonly known as:  PRINIVIL,ZESTRIL  Take 10 mg by mouth 2 (two) times daily.     nitroGLYCERIN 0.4 MG SL tablet  Commonly known as:  NITROSTAT  Place 0.4 mg under the tongue every 5 (five) minutes as needed (MAX 3 TABLETS). For chest pain     pantoprazole 40 MG tablet  Commonly known as:  PROTONIX  Take 40 mg by mouth daily.     polyethylene glycol packet  Commonly known as:  MIRALAX / GLYCOLAX  Take 17 g by mouth daily as needed for mild constipation.     simvastatin 40 MG tablet  Commonly known as:  ZOCOR  Take 20 mg by mouth at bedtime.        VVS, Skin clean, dry and intact without evidence of skin break down, no evidence of skin tears noted. IV catheter discontinued intact. Site without signs and symptoms of complications. Dressing and pressure applied. Pt voided 100cc amber color urine.  An After Visit Summary was printed and given to the patient.  D/c education  completed with patient/family including follow up instructions, medication list, d/c activities limitations if indicated, with other d/c instructions as indicated by MD - patient able to verbalize understanding, all questions fully answered.   Patient instructed to return to ED, call 911, or call MD for any changes in condition.   Patient escorted via [place], and D/C home via private auto.  [name] 03/15/2014 3:07 PM

## 2014-03-15 NOTE — Progress Notes (Addendum)
[facility]  Subjective  - Slept poorly   Objective 129/51 60 97.8 F (36.6 C) (Oral) 14 98%  Intake/Output Summary (Last 24 hours) at 03/15/14 0826 Last data filed at 03/15/14 0700  Gross per 24 hour  Intake   4055 ml  Output   2500 ml  Net   1555 ml   Left groin incision no hematoma some surrounding ecchymosis Right groin no hematoma Feet warm 2+ DP left absent pedal pulses right  Assessment/Planning: S/p EVAR yesterday Acute blood loss anemia asymptomatic Will d/c home today CT 1 month  FIELDS,CHARLES E 03/15/2014 8:26 AM --  Laboratory Lab Results:  Recent Labs  03/14/14 1310 03/15/14 0415  WBC 7.5 8.5  HGB 11.4* 11.0*  HCT 33.1* 32.3*  PLT 123* 104*   BMET  Recent Labs  03/14/14 1310 03/15/14 0415  NA 139 139  K 4.1 4.1  CL 105 105  CO2 24 23  GLUCOSE 93 108*  BUN 15 12  CREATININE 0.99 0.94  CALCIUM 8.7 8.6    COAG Lab Results  Component Value Date   INR 1.37 03/14/2014   INR 1.12 03/09/2014   INR 1.1* 04/06/2013   No results found for this basename: PTT      Addendum  Groins without hematoma Palpable left DP pulse, doppler signal right foot Afib, rate controlled. HR 60s Needs to void and ambulate. D/C home today. Follow up with CTA in 4 weeks.  Virgina Jock PA-C Vascular Surgery  [phone]

## 2014-03-15 NOTE — Telephone Encounter (Addendum)
Message copied by Gena Fray on Thu Mar 15, 2014  2:01 PM ------      Message from: [place], [place] K      Created: Thu Mar 15, 2014  9:06 AM      Regarding: Schedule                   ----- Message -----         From: Alvia Grove, PA-C         Sent: 03/15/2014   8:48 AM           To: Vvs Charge Pool            S/p EVAR 03/14/14            F/u in 4 weeks with Dr. Oneida Alar with CTA. Patient would like CTA done in [place], as he had previously            Thanks      Kim ------  03/15/14: working on [place] at [employer]

## 2014-03-15 NOTE — Progress Notes (Signed)
Paged Silva Bandy PA regarding pt stood up and frank blood came out of his penis stopped on its own, bladder scanned for only 66cc. Pt had ambulated in halls. Foley d/c'd at 0700. Restarted iv fluids and encouraged pt to drink. Will continue to monitor.

## 2014-03-15 NOTE — Progress Notes (Signed)
New orders rec'd for 500cc bolus. Will continue to monitor. Still waiting for patient to void.

## 2014-03-16 ENCOUNTER — Encounter (HOSPITAL_COMMUNITY): Payer: Self-pay | Admitting: Vascular Surgery

## 2014-03-18 NOTE — Discharge Summary (Signed)
[facility] Discharge Summary  Jonathan Arroyo 09/12/1929 80 y.o. male  [id number]  Admission Date: 03/14/2014  Discharge Date: 03/15/2014  Physician: Ruta Hinds, MD  Admission Diagnosis: Abdominal Aortic Aneurysm I71.4   HPI: This is a 80 y.o. male who has been followed by Dr. Oneida Alar for AAA. Previous studies demonstrate an AAA, measuring 4.8 cm. Today, he presents with a AAA measuring 5.3 on CTA abdomen/pelvis from [facility] on 02/09/14. The patient does not have back or abdominal pain. The patient is not a smoker. He also has a known left popliteal artery aneurysm which was last sized at 1 cm March 2015. Right popliteal artery was normal diameter. He also has a history of a renal artery duplex scan showed a left renal artery stenosis. He is currently on 2 blood pressure medications with no evidence of decline in renal function.  He has a history of CAD s/p CABG and redo-CABG and PCI. He was last seen by his cardiologist Dr. Angelena Form on 02/02/14. He is currently on plavix and aspirin.   Hospital Course:  The patient was admitted to the hospital and taken to the operating room on 03/14/2014 and underwent: Gore Excluder stent graft repair infrarenal AAA. The patient required primary repair of the left common femoral artery via cutdown. He had a type 2 endoleak (proximal lumbars) at the conclusion of case.    The patient tolerated the procedure well and was transported to the PACU in stable condition. On day of surgery, he had a palpable left dorsalis pedis pulse with dopplerable signal of right foot. He had no hematomas of the groins.   By POD 1, he was continuing to do well. He denied any pain. He had acute blood loss anemia but was asymptomatic. His groins were without hematoma and he had a DP pulse of the left foot and doppler signal of the right foot. After his foley catheter removal, he had difficulty voiding. His IV fluids were restarted and was  given a fluid bolus. He was encouraged to increase fluid intake. He was ambulating without difficulty.  Later that afternoon, he was able to void 100cc and was discharged home in good condition.    CBC    Component Value Date/Time   WBC 8.5 03/15/2014 0415   RBC 3.53* 03/15/2014 0415   HGB 11.0* 03/15/2014 0415   HCT 32.3* 03/15/2014 0415   PLT 104* 03/15/2014 0415   MCV 91.5 03/15/2014 0415   MCH 31.2 03/15/2014 0415   MCHC 34.1 03/15/2014 0415   RDW 13.5 03/15/2014 0415   LYMPHSABS 1.7 04/25/2013 1645   MONOABS 0.6 04/25/2013 1645   EOSABS 0.2 04/25/2013 1645   BASOSABS 0.0 04/25/2013 1645    BMET    Component Value Date/Time   NA 139 03/15/2014 0415   K 4.1 03/15/2014 0415   CL 105 03/15/2014 0415   CO2 23 03/15/2014 0415   GLUCOSE 108* 03/15/2014 0415   BUN 12 03/15/2014 0415   CREATININE 0.94 03/15/2014 0415   CREATININE 1.07 09/18/2011 1512   CALCIUM 8.6 03/15/2014 0415   GFRNONAA 76* 03/15/2014 0415   GFRAA 88* 03/15/2014 0415     Discharge Instructions:   The patient is discharged to home with extensive instructions on wound care and progressive ambulation.  They are instructed not to drive or perform any heavy lifting until returning to see the physician in his office.  Discharge Instructions    ABDOMINAL PROCEDURE/ANEURYSM REPAIR/AORTO-BIFEMORAL BYPASS:  Call MD for increased abdominal  pain; cramping diarrhea; nausea/vomiting    Complete by:  As directed      Call MD for:  redness, tenderness, or signs of infection (pain, swelling, bleeding, redness, odor or green/yellow discharge around incision site)    Complete by:  As directed      Call MD for:  severe or increased pain, loss or decreased feeling  in affected limb(s)    Complete by:  As directed      Call MD for:  temperature >100.5    Complete by:  As directed      Driving Restrictions    Complete by:  As directed   No driving for 2 weeks     Lifting restrictions    Complete by:  As directed   No  lifting for 4 weeks     Resume previous diet    Complete by:  As directed      may wash over wound with mild soap and water    Complete by:  As directed   Wash the groin wounds with soap and water daily and pat dry. (No tub bath-only shower)  Then put a dry gauze or washcloth there to keep this area dry daily and as needed.  Do not use Vaseline or neosporin on your incisions.  Only use soap and water on your incisions and then protect and keep dry.           Discharge Diagnosis:  Abdominal Aortic Aneurysm I71.4  Secondary Diagnosis: Patient Active Problem List   Diagnosis Date Noted  . Occlusion and stenosis of carotid artery without mention of cerebral infarction 08/14/2013  . Renal artery stenosis 08/14/2013  . AAA (abdominal aortic aneurysm) without rupture 07/27/2013  . Chronic systolic heart failure 16/02/9603  . Unstable angina 04/11/2013  . Cardiomyopathy, ischemic 04/11/2013  . History of DVT (deep vein thrombosis) 04/11/2013  . Other specified gastritis without mention of hemorrhage   . Hiatal hernia   . Anxiety   . Dyspnea on exertion 03/29/2013  . Palpitations 01/09/2011  . CAD, ARTERY BYPASS GRAFT 11/05/2009  . DYSLIPIDEMIA 12/01/2008  . HYPERTENSION 12/01/2008  . CORONARY ARTERY DISEASE 12/01/2008  . ABDOMINAL AORTIC ANEURYSM 12/01/2008  . GASTROESOPHAGEAL REFLUX DISEASE 12/01/2008  . HIATAL HERNIA, HX OF 12/01/2008  . RENAL CALCULUS, HX OF 12/01/2008  . KNEE REPLACEMENT, RIGHT, HX OF 12/01/2008  . CORONARY ARTERY BYPASS GRAFT, HX OF 12/01/2008  . PERCUTANEOUS TRANSLUMINAL CORONARY ANGIOPLASTY, HX OF 12/01/2008   Past Medical History  Diagnosis Date  . Hyperlipidemia   . Renal calculus     history  . Other specified gastritis without mention of hemorrhage   . Spinal stenosis, unspecified region other than cervical   . Hiatal hernia     HISTORY  . Acute venous embolism and thrombosis of unspecified deep vessels of lower extremity   . Abdominal aneurysm  without mention of rupture   . Esophageal reflux   . HTN (hypertension)   . CAD (coronary artery disease)     a. s/p CABG 1994, redo 2005. b. [place] 03/2013: 2/3 patent bpg. s/p PTCA/DES to distal LM and PTCA/DES to distal LCx.  . CHF (congestive heart failure)   . Stomach pain June 2013    Hospital stay at [facility]  . Myocardial infarction   . Anxiety   . Ischemic cardiomyopathy     a. EF history: 2011: 35-40%, 11/2012: 45-50%. b. 03/2013: 35% by cath.       Medication List    TAKE  these medications        AMBIEN 10 MG tablet  Generic drug:  zolpidem  Take 10 mg by mouth at bedtime as needed. For insomnia     aspirin 81 MG tablet  Take 81 mg by mouth daily.     busPIRone 10 MG tablet  Commonly known as:  BUSPAR  Take 10 mg by mouth 2 (two) times daily as needed (anxiety).     carvedilol 3.125 MG tablet  Commonly known as:  COREG  Take 1 tablet (3.125 mg total) by mouth 2 (two) times daily with a meal.     cloNIDine 0.2 MG tablet  Commonly known as:  CATAPRES  Take 0.1 mg by mouth 2 (two) times daily.     clopidogrel 75 MG tablet  Commonly known as:  PLAVIX  Take 75 mg by mouth daily.     finasteride 5 MG tablet  Commonly known as:  PROSCAR  Take 5 mg by mouth daily.     HYDROcodone-acetaminophen 5-325 MG per tablet  Commonly known as:  NORCO/VICODIN  Take 1 tablet by mouth every 6 (six) hours as needed for moderate pain.     lisinopril 20 MG tablet  Commonly known as:  PRINIVIL,ZESTRIL  Take 10 mg by mouth 2 (two) times daily.     nitroGLYCERIN 0.4 MG SL tablet  Commonly known as:  NITROSTAT  Place 0.4 mg under the tongue every 5 (five) minutes as needed (MAX 3 TABLETS). For chest pain     pantoprazole 40 MG tablet  Commonly known as:  PROTONIX  Take 40 mg by mouth daily.     polyethylene glycol packet  Commonly known as:  MIRALAX / GLYCOLAX  Take 17 g by mouth daily as needed for mild constipation.     simvastatin 40 MG tablet  Commonly known as:  ZOCOR    Take 20 mg by mouth at bedtime.        Vicodin #20 No Refill  Disposition: Home  Patient's condition: is Good  Follow up: 1. Dr. Oneida Alar in 4 weeks with CTA   Virgina Jock, PA-C [facility] [phone] 03/18/2014  2:39 PM   - For VQI Registry use --- Instructions: Press F2 to tab through selections.  Delete question if not applicable.   Post-op:  Time to Extubation: [x ] In OR, [ ]  < 12 hrs, [ ]  12-24 hrs, [ ]  >=24 hrs Vasopressors Req. Post-op: No MI: No., [ ]  Troponin only, [ ]  EKG or Clinical New Arrhythmia: No CHF: No ICU Stay: 0 days Transfusion: No    Complications: Resp failure: No., [ ]  Pneumonia, [ ]  Ventilator Chg in renal function: No., [ ]  Inc. Cr > 0.5, [ ]  Temp. Dialysis, [ ]  Permanent dialysis Leg ischemia: No., no Surgery needed, [ ]  Yes, Surgery needed, [ ]  Amputation Bowel ischemia: No., [ ]  Medical Rx, [ ]  Surgical Rx Wound complication: No., [ ]  Superficial separation/infection, [ ]  Return to OR Return to OR: No  Return to OR for bleeding: No Stroke: No., [ ]  Minor, [ ]  Major  Discharge medications: Statin use:  Yes If No: [ ]  For Medical reasons, [ ]  Non-compliant, [ ]  Not-indicated ASA use:  Yes  If No: [ ]  For Medical reasons, [ ]  Non-compliant, [ ]  Not-indicated Plavix use:  Yes If No: [ ]  For Medical reasons, [ ]  Non-compliant, [ ]  Not-indicated Beta blocker use:  Yes If No: [ ]  For Medical reasons, [ ]   Non-compliant, [ ]  Not-indicated

## 2014-03-19 ENCOUNTER — Telehealth: Payer: Self-pay

## 2014-03-19 NOTE — Telephone Encounter (Signed)
Phone call from pt's wife.  Reported pt. had a fever of 102 degrees on eve of 10/29 after being discharged from the hosp.  Reported the elevated temp. On 10/29 to the MD on call. Was advised to continue to monitor, and to treat with Tylenol.  Reported the temperature is continuing to go up in the evening, and last night it was "just above 101 degrees."  Again, reported to the MD on call, and advised to give Tylenol.  Stated that this AM, fever was at 100 degrees when he first got up, and increased to 101 degrees. Denies any urinary symptoms; denies burning or urgency.  Stated pt. has a cough, and coughs up a greenish foamy phlegm on occas.  Stated he had the cough prior to surgery.  Reported his daughter listened to his lungs with a stethoscope last night, and heard a few crackles.  Offered pt. appt. tomorrow @ 10:15 AM.  Wife stated she would prefer to check with one of the doctors in the morning to know if this is to be expected, or if he needs to come in for further evaluation.  Advised will call with update at 9:00 AM, and will discuss with MD in office.  Agrees w/ plan.

## 2014-03-20 NOTE — Telephone Encounter (Signed)
Elam Dutch, MD  Lynetta Mare Dontea Corlew, RN           If incision does not look right I can see him this week otherwise just send off his urine   Thanks   Juanda Crumble

## 2014-03-20 NOTE — Telephone Encounter (Signed)
Called pt. at 9:00 AM to check on status of fever.  Spoke with pt's wife.  Stated his temperature was 99 degrees this AM, and reported he hadn't had any Tylenol since bedtime last night.  Reported pt. denies any complaints; up and walking around, and ate breakfast.  Encouraged to have pt. drink fluids to stay well hydrated.  Encouraged to call office if any further concerns, or if fever persists.  Wife verb. understanding.  Will make Dr. Oneida Alar aware of reports of fever past several days.

## 2014-03-20 NOTE — Telephone Encounter (Signed)
Phone call to pt.  Advised of Dr. Oneida Alar recommendations.  Stated he had "burning upon urination when they took that tube out of my bladder at the hospital, but it gradually got better."  Denies any burning or dysuria at this time.  Denies any incisional redness / inflammation.  Advised that per order of Dr. Oneida Alar, will need to obtain a urinalysis.  Pt. requested to set this up through Dr. Willette Pa office in [place].  Phone call to Dr. Willette Pa office.  Requested to have pt. submit UA to rule out UTI.  Will fax note to Dr. Willette Pa office.  Advised a nurse will review and discuss with Dr. Bea Graff and return call to [facility].

## 2014-04-10 NOTE — Telephone Encounter (Signed)
Scheduled CTA Abd/Pelvis at [facility] (ph # [phone]) for Monday 04/16/14 @ 1:45pm. Faxed signed order to [phone] (filed at [employer] under "faxes") I have left a message for patient on his cell number. His home phone does not have voicemail.   His follow up with [facility] is scheduled for 04/19/14- I also left this information on his cell voicemail.   Waiting to hear back from Jonathan Arroyo to confirm, dpm

## 2014-04-18 ENCOUNTER — Encounter: Payer: Self-pay | Admitting: Vascular Surgery

## 2014-04-19 ENCOUNTER — Ambulatory Visit (INDEPENDENT_AMBULATORY_CARE_PROVIDER_SITE_OTHER): Payer: Self-pay | Admitting: Vascular Surgery

## 2014-04-19 ENCOUNTER — Encounter: Payer: Self-pay | Admitting: Vascular Surgery

## 2014-04-19 VITALS — BP 86/55 | HR 73 | Resp 16 | Ht 74.0 in | Wt 180.0 lb

## 2014-04-19 DIAGNOSIS — I714 Abdominal aortic aneurysm, without rupture, unspecified: Secondary | ICD-10-CM

## 2014-04-19 DIAGNOSIS — Z48812 Encounter for surgical aftercare following surgery on the circulatory system: Secondary | ICD-10-CM

## 2014-04-19 NOTE — Progress Notes (Signed)
   [facility] [place]  Subjective  - 80 y/o male here for his 1 month follow up s/p [facility].  He is doing well without complaints of back pain.  He has had 2 episodes of sharp sudden left sided abdominal pain that lasted < 3 sec. And went away.    Objective 86/55 73   16     Heart irregularly irregular Lungs CTA No wheezing Palpable DP bilateral Groins soft without hematoma well healed incisions. Abdominal NTTP    CTA reviewed with Dr. Oneida Alar that was performed at [facility].  04/17/2014: Type II endo leak measuring 5.7 X 5.3 Prior to surgery Measuring 5.3 x 5.2   Assessment/Planning: S/P [facility] There is not a significant change in the size of the aneurysm.  He has a type II endo leak that was present post op.  We will have him follow up at the 6 month post-op period and get a repeat CTA of the abdomin and pelvis. Activity as tolerates at this time.   Laurence Slate [facility] 04/19/2014 3:07 PM   History and exam findings as above. Groin sites are clean without evidence of hematoma. 2+ femoral pulses absent pedal pulses The patient was slightly hypotensive today. However he is asymptomatic from this. I reviewed the images of his CT scan he does have a type II 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 We will continue to observe this for now. If he has continued aneurysm growth we will consider further evaluation. Importance of follow-up stress of the patient today. He will have a repeat CT angiogram in 5 months.   Ruta Hinds, MD [facility] [place] Office: [phone] Pager: [phone]

## 2014-04-26 ENCOUNTER — Encounter: Payer: Self-pay | Admitting: Vascular Surgery

## 2014-04-26 ENCOUNTER — Encounter (HOSPITAL_COMMUNITY): Payer: Self-pay | Admitting: Cardiovascular Disease

## 2014-05-28 ENCOUNTER — Telehealth: Payer: Self-pay | Admitting: Cardiovascular Disease

## 2014-05-28 NOTE — Telephone Encounter (Signed)
New Msg         Pt calling, he is concerned about stopping a medication right away.    Please contact at [phone] which is cell #.

## 2014-05-28 NOTE — Telephone Encounter (Signed)
Spoke with pt. He reports he has been having trouble with bleeding hemorrhoids. Has been hospitalized in [facility] recently for this. No recommendations made to stop ASA or Plavix.  Pt reports about 8 days ago he stopped baby ASA. Noted only slight amount bleeding from hemorrhoids while off ASA. He resumed ASA this past Saturday. He reports it has been recommended he have surgery on hemorrhoids.  Pt is asking if OK to just stop ASA.  I explained to pt that Dr. Angelena Form wants him to continue ASA and Plavix unless contraindicated for bleeding issues. I told pt he needs to follow up with GI and surgeon to discuss need for surgery.  Pt will contact them.

## 2014-06-01 ENCOUNTER — Telehealth: Payer: Self-pay | Admitting: Cardiovascular Disease

## 2014-06-01 NOTE — Telephone Encounter (Signed)
New Msg     1. What dental office are you calling from? Pt is calling, will be going to [facility]    2. What is your office phone and fax number? Office number is [phone]  3. What type of procedure is the patient having performed? Pt tooth broke off at the gum and will need dental work  4. What date is procedure scheduled? 06/06/14  5. What is your question (ex. Antibiotics prior to procedure, holding medication-we need to know how long dentist wants pt to hold med)? Pt wants to know if he should stop any of his blood thinners before procedure?   Please contact pt in regards to this.

## 2014-06-05 ENCOUNTER — Telehealth: Payer: Self-pay | Admitting: Cardiovascular Disease

## 2014-06-05 NOTE — Telephone Encounter (Signed)
Spoke with pt. He broke tooth at gum and needs to have tooth and roots removed.  Dental work to be done at [facility].  Pt reports dental school has not given any recommendations about stopping ASA and Plavix prior to procedure. Pt reports dental school is asking for recommendations from Dr. Angelena Form.  Pt reports dental school may be calling office today.  I placed call to office number listed below but unable to speak with anyone or leave message. Will continue to try to reach clinic.

## 2014-06-05 NOTE — Telephone Encounter (Signed)
He should be able to hold his ASA and Plavix for the planned procedure if necessary per wishes of the dental team. chris

## 2014-06-05 NOTE — Telephone Encounter (Signed)
Spoke with Cyndi Bender (dental student at [employer] [facility]) and told her pt may stop ASA and Plavix if needed for dental work. (see previous phone note dated June 01, 2014)  She is seeing pt tomorrow and it will be determined at that time if pt needs to be scheduled for tooth removal.

## 2014-06-05 NOTE — Telephone Encounter (Signed)
New Msg      1. What dental office are you calling from? [facility]   2. What is your office phone and fax number? Cell [phone]    3. What type of procedure is the patient having performed? Potential  Extraction   4. What date is procedure scheduled? 06/06/14  5. What is your question (ex. Antibiotics prior to procedure, holding medication-we need to know how long dentist wants pt to hold med)? No meds helds, no antibiotics needed   Cyndi Bender calling from [facility] and would like a call back just to inform of potential extraction as a precaution.

## 2014-06-05 NOTE — Telephone Encounter (Signed)
I spoke with pt and gave him information from Dr. Angelena Form. I also tried again to reach dental clinic but was unable to reach them (No voicemail).  Pt will contact dental clinic.  Pt aware if medications stopped for dental work he should resume when safe from dental standpoint.

## 2014-06-15 ENCOUNTER — Telehealth: Payer: Self-pay | Admitting: Cardiovascular Disease

## 2014-06-20 ENCOUNTER — Telehealth: Payer: Self-pay | Admitting: Cardiovascular Disease

## 2014-06-20 NOTE — Telephone Encounter (Signed)
Received request from Nurse fax box, documents faxed for surgical clearance. To: [facility] Fax number: [phone] Attention: [id number]/km

## 2014-08-07 ENCOUNTER — Encounter: Payer: Self-pay | Admitting: Cardiovascular Disease

## 2014-08-07 ENCOUNTER — Ambulatory Visit (INDEPENDENT_AMBULATORY_CARE_PROVIDER_SITE_OTHER): Payer: Medicare Other | Admitting: Cardiovascular Disease

## 2014-08-07 VITALS — BP 152/74 | HR 78 | Ht 74.0 in | Wt 184.1 lb

## 2014-08-07 DIAGNOSIS — I714 Abdominal aortic aneurysm, without rupture, unspecified: Secondary | ICD-10-CM

## 2014-08-07 DIAGNOSIS — I340 Nonrheumatic mitral (valve) insufficiency: Secondary | ICD-10-CM

## 2014-08-07 DIAGNOSIS — I255 Ischemic cardiomyopathy: Secondary | ICD-10-CM

## 2014-08-07 DIAGNOSIS — I10 Essential (primary) hypertension: Secondary | ICD-10-CM

## 2014-08-07 DIAGNOSIS — I359 Nonrheumatic aortic valve disorder, unspecified: Secondary | ICD-10-CM

## 2014-08-07 DIAGNOSIS — I2510 Atherosclerotic heart disease of native coronary artery without angina pectoris: Secondary | ICD-10-CM

## 2014-08-07 NOTE — Patient Instructions (Signed)
Your physician wants you to follow-up in:  6 months.  You will receive a reminder letter in the mail two months in advance. If you don't receive a letter, please call our office to schedule the follow-up appointment.  Your physician has recommended you make the following change in your medication:  Stop Clopidogrel     

## 2014-08-07 NOTE — Progress Notes (Signed)
CC: leg pain, constipation  History of Present Illness: 81 yo male with history of HLD, HTN, GERD, CAD s/p CABG (1994 redo in 2005), ischemic cardiomyopathy, MR, hiatal hernia, DVT who is here today for cardiac follow up. He has been followed in the past by Dr. Lia Foyer. Redo CABG 07/04/03 with right radial graft to PDA, SVG to Circumflex. His LIMA to LAD was patent by cath. His AAA is followed by Dr. Oneida Alar in [facility]. He was admitted November 2014 with chest pain c/w angina. Cardiac cath 04/10/13 and found to have patent LIMA to LAD, patent free radial graft to PDA but occluded SVG to Circumflex system with 99% distal left main stenosis supplying the Circumflex as well as severe distal Circumflex stenosis. A 2.75 x 20 mm Promus Premier DES was deployed in the distal Circumflex extending into the second OM branch. A 3.0 x 16 mm Promus Premier DES was deployed in the distal left main extending into the proximal Circumflex. This stent was post-dilated with a 3.5 x 8 mm [place] balloon x 2. Echo February 2015 with LVEF=50%. Aortic stent graft placed October 2015 by Dr. Oneida Alar.   He is here today for follow up. No chest pains or SOB with exertion but he does note some dyspnea with climbing hills. He has not been exercising.   Primary Care Physician: Gilford Rile  Last Lipid Profile: Followed in primary care.   Past Medical History  Diagnosis Date  . Hyperlipidemia   . Renal calculus     history  . Other specified gastritis without mention of hemorrhage   . Spinal stenosis, unspecified region other than cervical   . Hiatal hernia     HISTORY  . Acute venous embolism and thrombosis of unspecified deep vessels of lower extremity   . Abdominal aneurysm without mention of rupture   . Esophageal reflux   . HTN (hypertension)   . CAD (coronary artery disease)     a. s/p CABG 1994, redo 2005. b. [place] 03/2013: 2/3 patent bpg. s/p PTCA/DES to distal LM and PTCA/DES to distal LCx.  . CHF (congestive heart  failure)   . Stomach pain June 2013    Hospital stay at [facility]  . Myocardial infarction   . Anxiety   . Ischemic cardiomyopathy     a. EF history: 2011: 35-40%, 11/2012: 45-50%. b. 03/2013: 35% by cath.    Past Surgical History  Procedure Laterality Date  . Ptca    . Total knee arthroplasty      right  . Joint replacement  2005    Right knee  . Spine surgery      X's 3  . Coronary artery bypass graft  1994 and  2005  . Coronary angioplasty with stent placement  04/10/2013    DES LEFT MAIN DES LEFT CIRCUMFLEX    DR MCALHANY  . Back surgery    . Rotator cuff repair Left   . Abdominal aortic endovascular stent graft N/A 03/14/2014    Procedure: ABDOMINAL AORTIC ENDOVASCULAR STENT GRAFT;  Surgeon: Elam Dutch, MD;  Location: [place];  Service: Vascular;  Laterality: N/A;  . Left heart catheterization with coronary angiogram N/A 04/10/2013    Procedure: LEFT HEART CATHETERIZATION WITH CORONARY ANGIOGRAM;  Surgeon: Burnell Blanks, MD;  Location: [facility] CATH LAB;  Service: Cardiovascular;  Laterality: N/A;    Current Outpatient Prescriptions  Medication Sig Dispense Refill  . aspirin 81 MG tablet Take 81 mg by mouth daily.    Marland Kitchen  busPIRone (BUSPAR) 10 MG tablet Take 10 mg by mouth 2 (two) times daily as needed (anxiety).     . carvedilol (COREG) 3.125 MG tablet Take 1 tablet (3.125 mg total) by mouth 2 (two) times daily with a meal. 60 tablet 6  . cloNIDine (CATAPRES) 0.2 MG tablet Take 0.1 mg by mouth 2 (two) times daily.     . clopidogrel (PLAVIX) 75 MG tablet Take 75 mg by mouth daily.      . finasteride (PROSCAR) 5 MG tablet Take 5 mg by mouth daily.      Marland Kitchen HYDROcodone-acetaminophen (NORCO/VICODIN) 5-325 MG per tablet Take 1 tablet by mouth every 6 (six) hours as needed for moderate pain. 20 tablet 0  . lisinopril (PRINIVIL,ZESTRIL) 20 MG tablet Take 10 mg by mouth 2 (two) times daily.     . nitroGLYCERIN (NITROSTAT) 0.4 MG SL tablet Place 0.4 mg under the tongue every 5 (five)  minutes as needed (MAX 3 TABLETS). For chest pain    . pantoprazole (PROTONIX) 40 MG tablet Take 40 mg by mouth daily.     . polyethylene glycol (MIRALAX / GLYCOLAX) packet Take 17 g by mouth daily as needed for mild constipation.    . simvastatin (ZOCOR) 40 MG tablet Take 20 mg by mouth at bedtime.     Marland Kitchen zolpidem (AMBIEN) 10 MG tablet Take 10 mg by mouth at bedtime as needed. For insomnia     No current facility-administered medications for this visit.    Allergies  Allergen Reactions  . Alum & Mag Hydroxide-Simeth Other (See Comments)    Unknown (Mylanta)  . Oxycodone-Acetaminophen Other (See Comments)    unknown    History   Social History  . Marital Status: Married    Spouse Name: N/A  . Number of Children: N/A  . Years of Education: N/A   Occupational History  . Not on file.   Social History Main Topics  . Smoking status: Never Smoker   . Smokeless tobacco: Never Used  . Alcohol Use: No  . Drug Use: No  . Sexual Activity: Not on file   Other Topics Concern  . Not on file   Social History Narrative    Family History  Problem Relation Age of Onset  . Coronary artery disease Mother   . Diabetes Mother   . Heart disease Mother   . Hyperlipidemia Mother   . Hypertension Mother   . Heart disease Father   . Hyperlipidemia Father   . Hypertension Father     Review of Systems:  As stated in the HPI and otherwise negative.   BP 152/74 mmHg  Pulse 78  Ht 6\' 2"  (1.88 m)  Wt 184 lb 1.9 oz (83.516 kg)  BMI 23.63 kg/m2  Physical Examination: General: Well developed, well nourished, NAD HEENT: OP clear, mucus membranes moist SKIN: warm, dry. No rashes. Neuro: No focal deficits Musculoskeletal: Muscle strength 5/5 all ext Psychiatric: Mood and affect normal Neck: No JVD, no carotid bruits, no thyromegaly, no lymphadenopathy. Lungs:Clear bilaterally, no wheezes, rhonci, crackles Cardiovascular: Regular rate and rhythm. No murmurs, gallops or  rubs. Abdomen:Soft. Bowel sounds present. Non-tender.  Extremities: No lower extremity edema. Pulses are 2 + in the bilateral DP/PT.  Cardiac cath 04/10/13: Left main: 99% distal stenosis.  Left Anterior Descending Artery: Large caliber vessel that courses to the apex. The vessel is occluded at the ostium. The entire proximal, mid and distal vessel including the diagonal fills from the patent IMA graft.  Circumflex  Artery: Large caliber, diffusely calcified vessel. 99% ostial stenosis. 40% proximal stenosis. First obtuse marginal branch is moderate in caliber with a proximal 30% stenosis. The distal AV groove Circumflex has a long 99% stenosis just before the takeoff of the second obtuse marginal branch. The distal AV groove Circumflex beyond this becomes small in caliber and has diffuse 50% stensosis.  Right Coronary Artery: 100% proximal occlusion. The mid and distal vessel fills from the patent free radial graft.  Graft Anatomy:  Both SVG to OM system are occluded  Free radial graft to PDA is patent  LIMA graft to mid LAD is patent  Left Ventricular Angiogram: LVEF=35%. Global hypokinesis.  Impression:  1. Severe triple vessel CAD s/p 3V CABG with 2/3 patent bypass grafts.  2. Occluded vein graft to OM system with high grade disease in distal left main leading into large OM system and severe stenosis distal Circumflex.  3. Successful PTCA/DES x 1 distal Left main artery  4. Successful PTCA/DES x 1 distal left Circumflex artery  5. Moderate LV systolic dysfunction  PCI Note: The sheath was upsized to a 6 [place] system. The patient was given a weight based bolus of Angiomax and a drip was started. When the ACT was over 200, I engaged the left main with a XB 3.0 guide. I then passed a Cougar IC wire down the Circumflex artery into the distal vessel. A 2.5 x 15 mm balloon was used to pre-dilate the distal left main stenosis and then the distal Circumflex into the second OM branch. A 2.75 x 20 mm  Promus Premier DES was deployed in the distal Circumflex extending into the second OM branch. This was post-dilated with a 2.75 x 15 mm [place] balloon. I then focused on lesion #2. I pulled the 2.75 x 15 [place] balloon into the distal left main and pre-dilated this lesion again. I then carefully positioned and deployed a 3.0 x 16 mm Promus Premier DES in the distal left main extending into the proximal Circumflex. This stent was post-dilated with a 3.5 x 8 mm [place] balloon x 2. There was an excellent angiographic result. The guide was removed.   Echo 07/14/13: Left ventricle: Septal hypokinesis The cavity size was mildly dilated. Wall thickness was increased in a pattern of mild LVH. The estimated ejection fraction was 50%. - Aortic valve: Mild regurgitation. - Left atrium: The atrium was moderately dilated. - Atrial septum: No defect or patent foramen ovale was Identified.  EKG: Sinus, rate 61 bpm. Non-specific T wave abnormality.   Assessment and Plan:   1. CAD: Stable. He is symptomatically improved since PCI of the circumflex and left main in November 2014. Will stop Plavix due to excessive bleeding from hemorrhoids. Continue ASA, statin and beta blocker.   2. PACs, PVCs: Noted on telemetry while in hospital. EKG today with PACs. Continue beta blocker.   3. Aortic valve insufficiency: Mild by echo February 2015.   4. Ischemic CM: Continue beta blocker, ACEI. Last LVEF=50% by echo 2/15.   5. Chronic Systolic CHF: Volume stable.   6. Hypertension: BP stable and mostly controlled at home. Continue current therapy.   7. Hyperlipidemia: Continue statin. Lipids followed in primary care.   8. AAA: Followed by [facility] and now s/p aortic stent graft.   9. Mitral Regurgitation: Trivial by echo February 2015.   10. Dyspnea on exertion: Lungs are clear. No new murmurs. Weight stable. No signs of CHF. LIkely due to deconditioning. I have encouraged daily exercise.

## 2014-09-06 ENCOUNTER — Other Ambulatory Visit (HOSPITAL_COMMUNITY): Payer: Medicare Other

## 2014-09-06 ENCOUNTER — Ambulatory Visit: Payer: Medicare Other | Admitting: Vascular Surgery

## 2014-09-20 ENCOUNTER — Ambulatory Visit: Payer: Medicare Other | Admitting: Vascular Surgery

## 2014-11-29 ENCOUNTER — Ambulatory Visit: Payer: Medicare Other | Admitting: Vascular Surgery

## 2015-02-12 ENCOUNTER — Ambulatory Visit (INDEPENDENT_AMBULATORY_CARE_PROVIDER_SITE_OTHER): Payer: Medicare Other | Admitting: Physician Assistant

## 2015-02-12 ENCOUNTER — Encounter: Payer: Self-pay | Admitting: Physician Assistant

## 2015-02-12 VITALS — BP 150/80 | HR 64 | Ht 74.0 in | Wt 186.0 lb

## 2015-02-12 DIAGNOSIS — Z951 Presence of aortocoronary bypass graft: Secondary | ICD-10-CM | POA: Diagnosis not present

## 2015-02-12 MED ORDER — FUROSEMIDE 20 MG PO TABS: 20.0000 mg | ORAL_TABLET | ORAL | Status: DC | PRN

## 2015-02-12 NOTE — Assessment & Plan Note (Addendum)
Stable without angina. Recommend resuming aspirin if okay with primary care and hemorrhoids are stable.

## 2015-02-12 NOTE — Patient Instructions (Signed)
Medication Instructions:  Your physician has recommended you make the following change in your medication: 1- START Taking Aspirin agian if recommended by Dr. Leeroy Bock: NONE  Testing/Procedures: NONE  Follow-Up: Your physician wants you to follow-up in: 6 months with Dr. Angelena Form. You will receive a reminder letter in the mail two months in advance. If you don't receive a letter, please call our office to schedule the follow-up appointment.

## 2015-02-12 NOTE — Assessment & Plan Note (Signed)
Blood pressure is up a little today. They check it at home and it's usually running lower. His clonidine was stopped because of hypotension and dizziness by primary care. Patient will continue to monitor and call if it stays elevated.

## 2015-02-12 NOTE — Assessment & Plan Note (Signed)
EF 50%. No evidence of heart failure. Patient takes when necessary Lasix for left ankle edema.

## 2015-02-12 NOTE — Progress Notes (Signed)
Cardiology Office Note   Date:  02/12/2015   ID:  Jonathan Arroyo, DOB Oct 20, 1929, MRN [id number]  PCP:  Gilford Rile, MD  Cardiologist:  Dr. Angelena Form   Chief Complaint:some edema   History of Present Illness: Jonathan Arroyo is a 81 y.o. male who presents for six-month follow-up. He has a  history of HLD, HTN, GERD, CAD s/p CABG (1994 redo in 2005), ischemic cardiomyopathy, MR, hiatal hernia, DVT who is here today for cardiac follow up. He has been followed in the past by Dr. Lia Foyer. Redo CABG 07/04/03 with right radial graft to PDA, SVG to Circumflex. His LIMA to LAD was patent by cath. His AAA is followed by Dr. Oneida Alar in [facility]. He was admitted November 2014 with chest pain c/w angina. Cardiac cath 04/10/13 and found to have patent LIMA to LAD, patent free radial graft to PDA but occluded SVG to Circumflex system with 99% distal left main stenosis supplying the Circumflex as well as severe distal Circumflex stenosis. A 2.75 x 20 mm Promus Premier DES was deployed in the distal Circumflex extending into the second OM branch. A 3.0 x 16 mm Promus Premier DES was deployed in the distal left main extending into the proximal Circumflex. This stent was post-dilated with a 3.5 x 8 mm [place] balloon x 2. Echo February 2015 with LVEF=50%. Aortic stent graft placed October 2015 by Dr. Oneida Alar.   Patient was seen in 07/2014 by Dr. Angelena Form at which time his Plavix was stopped because of excessive bleeding hemorrhoids. He then had to be hospitalized and required transfusions for the bleeding hemorrhoids and his aspirin was stopped. This was never restarted. Patient comes in today overall feeling quite well. He has rare chest pain and never used nitroglycerin. Blood pressure goes up and down but is usually pretty good according to his wife. His clonidine was stopped by primary care for low blood pressures and dizziness.He has occasional dyspnea on exertion but it is stops it resolves and he can continue on. No recurrent  dizziness or presyncope. No Further bleeding. He occasionally has left ankle swelling for which he takes when necessary Lasix.       Past Medical History  Diagnosis Date  . Hyperlipidemia   . Renal calculus     history  . Other specified gastritis without mention of hemorrhage   . Spinal stenosis, unspecified region other than cervical   . Hiatal hernia     HISTORY  . Acute venous embolism and thrombosis of unspecified deep vessels of lower extremity   . Abdominal aneurysm without mention of rupture   . Esophageal reflux   . HTN (hypertension)   . CAD (coronary artery disease)     a. s/p CABG 1994, redo 2005. b. [place] 03/2013: 2/3 patent bpg. s/p PTCA/DES to distal LM and PTCA/DES to distal LCx.  . CHF (congestive heart failure)   . Stomach pain June 2013    Hospital stay at [facility]  . Myocardial infarction   . Anxiety   . Ischemic cardiomyopathy     a. EF history: 2011: 35-40%, 11/2012: 45-50%. b. 03/2013: 35% by cath.    Past Surgical History  Procedure Laterality Date  . Ptca    . Total knee arthroplasty      right  . Joint replacement  2005    Right knee  . Spine surgery      X's 3  . Coronary artery bypass graft  1994 and  2005  . Coronary angioplasty with stent  placement  04/10/2013    DES LEFT MAIN DES LEFT CIRCUMFLEX    DR MCALHANY  . Back surgery    . Rotator cuff repair Left   . Abdominal aortic endovascular stent graft N/A 03/14/2014    Procedure: ABDOMINAL AORTIC ENDOVASCULAR STENT GRAFT;  Surgeon: Elam Dutch, MD;  Location: [place];  Service: Vascular;  Laterality: N/A;  . Left heart catheterization with coronary angiogram N/A 04/10/2013    Procedure: LEFT HEART CATHETERIZATION WITH CORONARY ANGIOGRAM;  Surgeon: Burnell Blanks, MD;  Location: [facility] CATH LAB;  Service: Cardiovascular;  Laterality: N/A;     Current Outpatient Prescriptions  Medication Sig Dispense Refill  . aspirin 81 MG tablet Take 81 mg by mouth daily.    . busPIRone (BUSPAR) 10  MG tablet Take 10 mg by mouth 2 (two) times daily as needed (anxiety).     . carvedilol (COREG) 3.125 MG tablet Take 1 tablet (3.125 mg total) by mouth 2 (two) times daily with a meal. 60 tablet 6  . cloNIDine (CATAPRES) 0.2 MG tablet Take 0.1 mg by mouth 2 (two) times daily.     . finasteride (PROSCAR) 5 MG tablet Take 5 mg by mouth daily.      Marland Kitchen HYDROcodone-acetaminophen (NORCO/VICODIN) 5-325 MG per tablet Take 1 tablet by mouth every 6 (six) hours as needed for moderate pain. 20 tablet 0  . lisinopril (PRINIVIL,ZESTRIL) 20 MG tablet Take 10 mg by mouth 2 (two) times daily.     . nitroGLYCERIN (NITROSTAT) 0.4 MG SL tablet Place 0.4 mg under the tongue every 5 (five) minutes as needed (MAX 3 TABLETS). For chest pain    . pantoprazole (PROTONIX) 40 MG tablet Take 40 mg by mouth daily.     . polyethylene glycol (MIRALAX / GLYCOLAX) packet Take 17 g by mouth daily as needed for mild constipation.    . simvastatin (ZOCOR) 40 MG tablet Take 20 mg by mouth at bedtime.     Marland Kitchen zolpidem (AMBIEN) 10 MG tablet Take 10 mg by mouth at bedtime as needed. For insomnia     No current facility-administered medications for this visit.    Allergies:   Alum & mag hydroxide-simeth and Oxycodone-acetaminophen    Social History:  The patient  reports that he has never smoked. He has never used smokeless tobacco. He reports that he does not drink alcohol or use illicit drugs.   Family History:  The patient's    family history includes Coronary artery disease in his mother; Diabetes in his mother; Heart attack in his father and mother; Heart disease in his father and mother; Hyperlipidemia in his father and mother; Hypertension in his father and mother. There is no history of Stroke.    ROS:  Please see the history of present illness.   Otherwise, review of systems are positive for leg pain, back pain balance issues.   All other systems are reviewed and negative.    PHYSICAL EXAM: VS:  BP 150/80 mmHg  Pulse 64   Ht 6\' 2"  (1.88 m)  Wt 186 lb (84.369 kg)  BMI 23.87 kg/m2 , BMI Body mass index is 23.87 kg/(m^2). GEN: Well nourished, well developed, in no acute distress Neck: no JVD, HJR, carotid bruits, or masses Cardiac: RRR; positive S4 2/6 systolic murmur LSB, no rubs, thrill or heave,  Respiratory:  clear to auscultation bilaterally, normal work of breathing GI: soft, nontender, nondistended, + BS MS: no deformity or atrophy Extremities: without cyanosis, clubbing, edema, good distal pulses  bilaterally.  Skin: warm and dry, no rash Neuro:  Strength and sensation are intact    EKG:  EKG is ordered today. The ekg ordered today demonstrates normal sinus rhythm with PAC, no acute change   Recent Labs: 03/09/2014: ALT 13 03/14/2014: Magnesium 1.9 03/15/2014: BUN 12; Creatinine, Ser 0.94; Hemoglobin 11.0*; Platelets 104*; Potassium 4.1; Sodium 139    Lipid Panel No results found for: CHOL, TRIG, HDL, CHOLHDL, VLDL, LDLCALC, LDLDIRECT    Wt Readings from Last 3 Encounters:  02/12/15 186 lb (84.369 kg)  08/07/14 184 lb 1.9 oz (83.516 kg)  04/19/14 180 lb (81.647 kg)      Other studies Reviewed: Additional studies/ records that were reviewed today include and review of the records demonstrates:  Cardiac cath 04/10/13: Left main: 99% distal stenosis.   Left Anterior Descending Artery: Large caliber vessel that courses to the apex. The vessel is occluded at the ostium. The entire proximal, mid and distal vessel including the diagonal fills from the patent IMA graft.   Circumflex Artery: Large caliber, diffusely calcified vessel. 99% ostial stenosis. 40% proximal stenosis. First obtuse marginal branch is moderate in caliber with a proximal 30% stenosis. The distal AV groove Circumflex has a long 99% stenosis just before the takeoff of the second obtuse marginal branch. The distal AV groove Circumflex beyond this becomes small in caliber and has diffuse 50% stensosis.   Right Coronary Artery:  100% proximal occlusion. The mid and distal vessel fills from the patent free radial graft.   Graft Anatomy:  Both SVG to OM system are occluded   Free radial graft to PDA is patent   LIMA graft to mid LAD is patent   Left Ventricular Angiogram: LVEF=35%. Global hypokinesis.   Impression:   1. Severe triple vessel CAD s/p 3V CABG with 2/3 patent bypass grafts.   2. Occluded vein graft to OM system with high grade disease in distal left main leading into large OM system and severe stenosis distal Circumflex.   3. Successful PTCA/DES x 1 distal Left main artery   4. Successful PTCA/DES x 1 distal left Circumflex artery   5. Moderate LV systolic dysfunction  PCI Note: The sheath was upsized to a 6 [place] system. The patient was given a weight based bolus of Angiomax and a drip was started. When the ACT was over 200, I engaged the left main with a XB 3.0 guide. I then passed a Cougar IC wire down the Circumflex artery into the distal vessel. A 2.5 x 15 mm balloon was used to pre-dilate the distal left main stenosis and then the distal Circumflex into the second OM branch. A 2.75 x 20 mm Promus Premier DES was deployed in the distal Circumflex extending into the second OM branch. This was post-dilated with a 2.75 x 15 mm [place] balloon. I then focused on lesion #2. I pulled the 2.75 x 15 [place] balloon into the distal left main and pre-dilated this lesion again. I then carefully positioned and deployed a 3.0 x 16 mm Promus Premier DES in the distal left main extending into the proximal Circumflex. This stent was post-dilated with a 3.5 x 8 mm [place] balloon x 2. There was an excellent angiographic result. The guide was removed.   Echo 07/14/13: Left ventricle: Septal hypokinesis The cavity size was mildly dilated. Wall thickness was increased in a pattern of mild LVH. The estimated ejection fraction was 50%. - Aortic valve: Mild regurgitation. - Left atrium: The atrium was moderately dilated. -  Atrial septum: No  defect or patent foramen ovale was Identified.    ASSESSMENT AND PLAN: CORONARY ARTERY BYPASS GRAFT, HX OF Stable without angina. Recommend resuming aspirin if okay with primary care and hemorrhoids are stable.  Essential hypertension Blood pressure is up a little today. They check it at home and it's usually running lower. His clonidine was stopped because of hypotension and dizziness by primary care. Patient will continue to monitor and call if it stays elevated.  Chronic systolic heart failure EF 50%. No evidence of heart failure. Patient takes when necessary Lasix for left ankle edema.     Sumner Boast, PA-C  02/12/2015 1:11 PM    [facility] [place], [place], [place]  [address] Phone: [phone]; Fax: [phone]

## 2015-02-18 ENCOUNTER — Telehealth: Payer: Self-pay | Admitting: Vascular Surgery

## 2015-02-18 ENCOUNTER — Other Ambulatory Visit: Payer: Self-pay | Admitting: *Deleted

## 2015-02-18 DIAGNOSIS — I714 Abdominal aortic aneurysm, without rupture, unspecified: Secondary | ICD-10-CM

## 2015-02-18 DIAGNOSIS — Z8679 Personal history of other diseases of the circulatory system: Secondary | ICD-10-CM

## 2015-02-18 DIAGNOSIS — Z9889 Other specified postprocedural states: Principal | ICD-10-CM

## 2015-02-18 NOTE — Telephone Encounter (Signed)
-----   Message from Rufina Falco sent at 02/15/2015  8:31 AM EDT ----- Regarding: FW: Pt inquiring about u/s sch'd 10/13 Contact: [phone]   ----- Message -----    From: Denman George, RN    Sent: 02/14/2015   4:22 PM      To: Gardiner Fanti Reaves Subject: RE: Pt inquiring about u/s sch'd 10/13         Dr. Oneida Alar requests CTA Abd/ Pelvis instead of the Ultrasound for his appt. in October. (I forgot to ask if he was okay with the pt. getting this at the [facility], but if he can get the film on CD ROM, to bring to office appt., this should be okay.)    ----- Message -----    From: Rufina Falco    Sent: 02/14/2015   3:25 PM      To: [facility] Clinical Pool Subject: Pt inquiring about u/s sch'd 10/13             Pt called inquiring about EVAR scan scheduled on 10/13- He is s/p EVAR w/ [facility] on 03/15/2015, he never went to his 46mo f/u CTA scan in 09/2014.   He is questioning if he should have the CTA before seeing [facility] instead of having the duplex here?  If so, he needs to have the CTA at [facility] due to post op problems he is having from a recent hemorrhoidectomy.  The duplex was scheduled prior to the patient deciding to have the EVAR.     Rip Harbour

## 2015-02-18 NOTE — Telephone Encounter (Signed)
Sent to [facility] pool for an order.

## 2015-02-25 ENCOUNTER — Telehealth: Payer: Self-pay | Admitting: Vascular Surgery

## 2015-02-25 NOTE — Telephone Encounter (Signed)
Faxed order to [facility]- called patient to let him know that he can expect a call from their CT department to schedule. He knows to call us if he does not receive a call within the next few days, dpm

## 2015-02-25 NOTE — Telephone Encounter (Signed)
-----   Message from Mena Goes, RN sent at 02/18/2015  9:17 AM EDT ----- Regarding: RE: Pt inquiring about u/s sch'd 10/13 Contact: [phone] Done  ----- Message -----    From: Gena Fray    Sent: 02/18/2015   8:58 AM      To: [facility] Pool Subject: FW: Pt inquiring about u/s sch'd 10/13         Please enter order for CTA abd/pelvis. (see message thread below) There is an old CT order in that expired in August.  Thank you! [name] ----- Message -----    From: Rufina Falco    Sent: 02/15/2015   8:31 AM      To: [facility] Pool Subject: FW: Pt inquiring about u/s sch'd 10/13           ----- Message -----    From: Denman George, RN    Sent: 02/14/2015   4:22 PM      To: Gardiner Fanti Reaves Subject: RE: Pt inquiring about u/s sch'd 10/13         Dr. Oneida Alar requests CTA Abd/ Pelvis instead of the Ultrasound for his appt. in October. (I forgot to ask if he was okay with the pt. getting this at the [facility], but if he can get the film on CD ROM, to bring to office appt., this should be okay.)    ----- Message -----    From: Rufina Falco    Sent: 02/14/2015   3:25 PM      To: [facility] Clinical Pool Subject: Pt inquiring about u/s sch'd 10/13             Pt called inquiring about EVAR scan scheduled on 10/13- He is s/p EVAR w/ [facility] on 03/15/2015, he never went to his 62mo f/u CTA scan in 09/2014.   He is questioning if he should have the CTA before seeing [facility] instead of having the duplex here?  If so, he needs to have the CTA at [facility] due to post op problems he is having from a recent hemorrhoidectomy.  The duplex was scheduled prior to the patient deciding to have the EVAR.     Rip Harbour

## 2015-02-28 ENCOUNTER — Other Ambulatory Visit (HOSPITAL_COMMUNITY): Payer: Medicare Other

## 2015-02-28 ENCOUNTER — Ambulatory Visit: Payer: Medicare Other | Admitting: Vascular Surgery

## 2015-04-04 ENCOUNTER — Ambulatory Visit: Payer: Medicare Other | Admitting: Vascular Surgery

## 2015-04-19 ENCOUNTER — Encounter: Payer: Self-pay | Admitting: Vascular Surgery

## 2015-04-24 ENCOUNTER — Ambulatory Visit (INDEPENDENT_AMBULATORY_CARE_PROVIDER_SITE_OTHER): Payer: Medicare Other | Admitting: Vascular Surgery

## 2015-04-24 ENCOUNTER — Encounter: Payer: Self-pay | Admitting: Vascular Surgery

## 2015-04-24 VITALS — BP 152/89 | HR 112 | Temp 97.2°F | Resp 16 | Ht 74.0 in | Wt 189.0 lb

## 2015-04-24 DIAGNOSIS — I714 Abdominal aortic aneurysm, without rupture, unspecified: Secondary | ICD-10-CM

## 2015-04-24 NOTE — Progress Notes (Signed)
Filed Vitals:   04/24/15 1305 04/24/15 1312  BP: 160/88 152/89  Pulse: 112 112  Temp: 97.2 F (36.2 C)   Resp: 16   Height: 6\' 2"  (1.88 m)   Weight: 189 lb (85.73 kg)   SpO2: 98%

## 2015-04-24 NOTE — Progress Notes (Signed)
[facility]  History of present illness: 81 y/o male here for his 1 year follow up s/p EVAR October 2015 .  He is doing well without complaints of back pain.  he has a known type II endoleak. Last abdominal aortic aneurysm measurement was 5.7 cm. He also has a known 1 cm left popliteal aneurysm.  He does have symptoms which sound consistent with neuropathy with burning and stinging circumferentially from the knee down on both legs. He denies claudication symptoms. He had significant GI bleeding from hemorrhoids and required fairly urgent hemorrhoidectomy and during the last year but states the bleeding has resolved since then. Other medical problems include coronary artery disease hypertension and congestive failure all of which are currently been stable.  Past Medical History  Diagnosis Date  . Hyperlipidemia   . Renal calculus     history  . Other specified gastritis without mention of hemorrhage   . Spinal stenosis, unspecified region other than cervical   . Hiatal hernia     HISTORY  . Acute venous embolism and thrombosis of unspecified deep vessels of lower extremity   . Abdominal aneurysm without mention of rupture   . Esophageal reflux   . HTN (hypertension)   . CAD (coronary artery disease)     a. s/p CABG 1994, redo 2005. b. [place] 03/2013: 2/3 patent bpg. s/p PTCA/DES to distal LM and PTCA/DES to distal LCx.  . CHF (congestive heart failure) ([place])   . Stomach pain June 2013    Hospital stay at [facility]  . Myocardial infarction ([place])   . Anxiety   . Ischemic cardiomyopathy     a. EF history: 2011: 35-40%, 11/2012: 45-50%. b. 03/2013: 35% by cath.  . Congestive heart failure (CHF) [facility])    Past Surgical History  Procedure Laterality Date  . Ptca    . Total knee arthroplasty      right  . Joint replacement  2005    Right knee  . Spine surgery      X's 3  . Coronary artery bypass graft  1994 and  2005  . Coronary angioplasty with stent placement   04/10/2013    DES LEFT MAIN DES LEFT CIRCUMFLEX    DR MCALHANY  . Back surgery    . Rotator cuff repair Left   . Abdominal aortic endovascular stent graft N/A 03/14/2014    Procedure: ABDOMINAL AORTIC ENDOVASCULAR STENT GRAFT;  Surgeon: Elam Dutch, MD;  Location: [facility];  Service: Vascular;  Laterality: N/A;  . Left heart catheterization with coronary angiogram N/A 04/10/2013    Procedure: LEFT HEART CATHETERIZATION WITH CORONARY ANGIOGRAM;  Surgeon: Burnell Blanks, MD;  Location: [facility] CATH LAB;  Service: Cardiovascular;  Laterality: N/A;  . Hiatal hernia repair    . Abdominal aortic aneurysm repair      Current Outpatient Prescriptions on File Prior to Visit  Medication Sig Dispense Refill  . aspirin 81 MG tablet Take 81 mg by mouth daily.    . busPIRone (BUSPAR) 10 MG tablet Take 10 mg by mouth 2 (two) times daily as needed (anxiety).     . carvedilol (COREG) 3.125 MG tablet Take 1 tablet (3.125 mg total) by mouth 2 (two) times daily with a meal. 60 tablet 6  . finasteride (PROSCAR) 5 MG tablet Take 5 mg by mouth daily.      . furosemide (LASIX) 20 MG tablet Take 1 tablet (20 mg total) by mouth as needed for edema.    Marland Kitchen  HYDROcodone-acetaminophen (NORCO/VICODIN) 5-325 MG per tablet Take 1 tablet by mouth every 6 (six) hours as needed for moderate pain. 20 tablet 0  . lisinopril (PRINIVIL,ZESTRIL) 20 MG tablet Take 10 mg by mouth 2 (two) times daily.     . nitroGLYCERIN (NITROSTAT) 0.4 MG SL tablet Place 0.4 mg under the tongue every 5 (five) minutes as needed (MAX 3 TABLETS). For chest pain    . pantoprazole (PROTONIX) 40 MG tablet Take 40 mg by mouth daily.     . polyethylene glycol (MIRALAX / GLYCOLAX) packet Take 17 g by mouth daily as needed for mild constipation.    . simvastatin (ZOCOR) 40 MG tablet Take 20 mg by mouth at bedtime.     Marland Kitchen zolpidem (AMBIEN) 10 MG tablet Take 10 mg by mouth at bedtime as needed. For insomnia    . cloNIDine (CATAPRES) 0.2 MG tablet Take 0.1 mg  by mouth 2 (two) times daily.      No current facility-administered medications on file prior to visit.    Allergies  Allergen Reactions  . Alum & Mag Hydroxide-Simeth Other (See Comments)    Unknown (Mylanta)  . Oxycodone-Acetaminophen Other (See Comments)    unknown     Physical exam:  Filed Vitals:   04/24/15 1305 04/24/15 1312  BP: 160/88 152/89  Pulse: 112 112  Temp: 97.2 F (36.2 C)   Resp: 16   Height: 6\' 2"  (1.88 m)   Weight: 189 lb (85.73 kg)   SpO2: 98%     Heart regular rate and rhythm Lungs CTA No wheezing 2+ femoral pulses Palpable DP bilateral side absent pedal pulses right side palpable popliteal pulses bilaterally Abdominal soft nontender no palpable mass   CTA 04/17/2014: Type II endo leak measuring 5.7 X 5.3 Prior to surgery Measuring 5.3 x 5.2, reviewed the patient's CT scan from October 2016 again from [facility] which again shows a late type II endoleak with aneurysm diameter still at 5.7 cm unchanged   Assessment/Planning:  Type II 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 We will continue to observe this for now. If he has continued aneurysm growth we will consider further evaluation. Importance of follow-up stress of the patient today. He will have a repeat CT angiogram in 1 year. We will also do an ultrasound of his left popliteal artery in our office at that 1 year evaluation.   Ruta Hinds, MD [facility] Office: [phone] Pager: [phone]

## 2015-04-25 ENCOUNTER — Other Ambulatory Visit: Payer: Self-pay

## 2015-04-25 DIAGNOSIS — I714 Abdominal aortic aneurysm, without rupture, unspecified: Secondary | ICD-10-CM

## 2015-04-25 DIAGNOSIS — I739 Peripheral vascular disease, unspecified: Secondary | ICD-10-CM

## 2015-06-24 DIAGNOSIS — R159 Full incontinence of feces: Secondary | ICD-10-CM | POA: Insufficient documentation

## 2015-06-24 DIAGNOSIS — R5383 Other fatigue: Secondary | ICD-10-CM

## 2015-06-24 DIAGNOSIS — I509 Heart failure, unspecified: Secondary | ICD-10-CM | POA: Insufficient documentation

## 2015-06-24 DIAGNOSIS — F064 Anxiety disorder due to known physiological condition: Secondary | ICD-10-CM | POA: Insufficient documentation

## 2015-06-24 DIAGNOSIS — G4700 Insomnia, unspecified: Secondary | ICD-10-CM | POA: Insufficient documentation

## 2015-06-24 DIAGNOSIS — Z79899 Other long term (current) drug therapy: Secondary | ICD-10-CM | POA: Insufficient documentation

## 2015-06-24 DIAGNOSIS — R5381 Other malaise: Secondary | ICD-10-CM | POA: Insufficient documentation

## 2015-06-24 DIAGNOSIS — E538 Deficiency of other specified B group vitamins: Secondary | ICD-10-CM | POA: Insufficient documentation

## 2015-06-24 DIAGNOSIS — I2510 Atherosclerotic heart disease of native coronary artery without angina pectoris: Secondary | ICD-10-CM | POA: Insufficient documentation

## 2015-06-24 DIAGNOSIS — D179 Benign lipomatous neoplasm, unspecified: Secondary | ICD-10-CM | POA: Insufficient documentation

## 2015-06-24 DIAGNOSIS — N400 Enlarged prostate without lower urinary tract symptoms: Secondary | ICD-10-CM | POA: Insufficient documentation

## 2015-06-24 DIAGNOSIS — E785 Hyperlipidemia, unspecified: Secondary | ICD-10-CM | POA: Insufficient documentation

## 2015-06-24 DIAGNOSIS — I10 Essential (primary) hypertension: Secondary | ICD-10-CM | POA: Insufficient documentation

## 2015-06-24 DIAGNOSIS — K449 Diaphragmatic hernia without obstruction or gangrene: Secondary | ICD-10-CM | POA: Insufficient documentation

## 2015-06-24 DIAGNOSIS — M5137 Other intervertebral disc degeneration, lumbosacral region: Secondary | ICD-10-CM | POA: Insufficient documentation

## 2015-06-24 DIAGNOSIS — F339 Major depressive disorder, recurrent, unspecified: Secondary | ICD-10-CM | POA: Insufficient documentation

## 2015-06-24 DIAGNOSIS — I719 Aortic aneurysm of unspecified site, without rupture: Secondary | ICD-10-CM | POA: Insufficient documentation

## 2015-06-24 DIAGNOSIS — I739 Peripheral vascular disease, unspecified: Secondary | ICD-10-CM | POA: Insufficient documentation

## 2015-06-24 DIAGNOSIS — M1990 Unspecified osteoarthritis, unspecified site: Secondary | ICD-10-CM | POA: Insufficient documentation

## 2015-07-19 ENCOUNTER — Telehealth: Payer: Self-pay | Admitting: *Deleted

## 2015-07-19 NOTE — Telephone Encounter (Signed)
Received fax from [facility] indicating pt has been complaining of shortness of breath. Weight stable.  I called and spoke with pt. He reports shortness of breath when walking to storage building and up stairs. This is not worsening but is new for him. Noted weight gain a couple weeks ago of 4 lbs in one day. Took prn Lasix daily for 2 days and weight returned to normal. No chest pain. He has appointment with Ellen Henri, PA on 07/24/15 and is aware to keep this appointment.

## 2015-07-24 ENCOUNTER — Ambulatory Visit (INDEPENDENT_AMBULATORY_CARE_PROVIDER_SITE_OTHER): Payer: Medicare Other | Admitting: Cardiology

## 2015-07-24 ENCOUNTER — Encounter: Payer: Self-pay | Admitting: Cardiology

## 2015-07-24 VITALS — BP 134/78 | HR 78 | Ht 73.0 in | Wt 194.8 lb

## 2015-07-24 DIAGNOSIS — R635 Abnormal weight gain: Secondary | ICD-10-CM

## 2015-07-24 DIAGNOSIS — R0600 Dyspnea, unspecified: Secondary | ICD-10-CM | POA: Diagnosis not present

## 2015-07-24 LAB — BASIC METABOLIC PANEL
BUN: 17 mg/dL (ref 7–25)
CHLORIDE: 101 mmol/L (ref 98–110)
CO2: 27 mmol/L (ref 20–31)
Calcium: 9.5 mg/dL (ref 8.6–10.3)
Creat: 1.06 mg/dL (ref 0.70–1.11)
GLUCOSE: 80 mg/dL (ref 65–99)
POTASSIUM: 4.4 mmol/L (ref 3.5–5.3)
SODIUM: 138 mmol/L (ref 135–146)

## 2015-07-24 NOTE — Progress Notes (Signed)
07/24/2015 Jonathan Arroyo   [birth year]  [id number]  Primary Physician Gilford Rile, MD Primary Cardiologist: Dr. Angelena Form   Reason for Visit/CC: DOE   HPI:  82 yo male with history of HLD, HTN, GERD, CAD s/p CABG (1994 redo in 2005), ischemic cardiomyopathy, MR, hiatal hernia, DVT who is here today for cardiac follow up. He has been followed in the past by Dr. Lia Foyer. Redo CABG 07/04/03 with right radial graft to PDA, SVG to Circumflex. His LIMA to LAD was patent by cath. His AAA is followed by Dr. Oneida Alar in [facility]. He was admitted November 2014 with chest pain c/w angina. Cardiac cath 04/10/13 and found to have patent LIMA to LAD, patent free radial graft to PDA but occluded SVG to Circumflex system with 99% distal left main stenosis supplying the Circumflex as well as severe distal Circumflex stenosis. A 2.75 x 20 mm Promus Premier DES was deployed in the distal Circumflex extending into the second OM branch. A 3.0 x 16 mm Promus Premier DES was deployed in the distal left main extending into the proximal Circumflex. This stent was post-dilated with a 3.5 x 8 mm [place] balloon x 2. Echo February 2015 with LVEF=50%. Aortic stent graft placed October 2015 by Dr. Oneida Alar. He was last seen by Dr. Angelena Form 08/07/14 and was noted to be stable. Dr. Angelena Form elected to discontinue his Plavix due to excessive bleeding from hemorrhoids . He was continued on ASA, statin and a BB.   He presents to clinic today for evaluation of dyspnea and weight gain. Our office received a fax from [facility] indicating pt has been complaining of shortness of breath.  He reports shortness of breath when walking to storage building and up stairs. This is not worsening but is new for him. alo noted weight gain a couple weeks ago of 4 lbs in one day. Took prn Lasix daily for 2 days and weight returned to normal. No chest pain. He does admit to abdominal fullness. No resting dyspnea, orthopnea or PND. He denies any dietary  indiscretion with sodium. He takes lasix only PRN based on weight changes.     Current Outpatient Prescriptions  Medication Sig Dispense Refill  . aspirin 81 MG tablet Take 81 mg by mouth daily.    . busPIRone (BUSPAR) 10 MG tablet Take 10 mg by mouth 2 (two) times daily as needed (anxiety).     . carvedilol (COREG) 3.125 MG tablet Take 1 tablet (3.125 mg total) by mouth 2 (two) times daily with a meal. 60 tablet 6  . cloNIDine (CATAPRES) 0.2 MG tablet Take 0.1 mg by mouth 2 (two) times daily.     . finasteride (PROSCAR) 5 MG tablet Take 5 mg by mouth daily.      . furosemide (LASIX) 20 MG tablet Take 1 tablet (20 mg total) by mouth as needed for edema.    Marland Kitchen HYDROcodone-acetaminophen (NORCO/VICODIN) 5-325 MG per tablet Take 1 tablet by mouth every 6 (six) hours as needed for moderate pain. 20 tablet 0  . lisinopril (PRINIVIL,ZESTRIL) 20 MG tablet Take 10 mg by mouth 2 (two) times daily.     . nitroGLYCERIN (NITROSTAT) 0.4 MG SL tablet Place 0.4 mg under the tongue every 5 (five) minutes as needed (MAX 3 TABLETS). For chest pain    . pantoprazole (PROTONIX) 40 MG tablet Take 40 mg by mouth daily.     . polyethylene glycol (MIRALAX / GLYCOLAX) packet Take 17 g by mouth daily as needed for  mild constipation.    . simvastatin (ZOCOR) 40 MG tablet Take 20 mg by mouth at bedtime.     Marland Kitchen zolpidem (AMBIEN) 10 MG tablet Take 10 mg by mouth at bedtime as needed. For insomnia     No current facility-administered medications for this visit.    Allergies  Allergen Reactions  . Alum & Mag Hydroxide-Simeth Other (See Comments)    Unknown (Mylanta)  . Oxycodone-Acetaminophen Other (See Comments)    unknown    Social History   Social History  . Marital Status: Married    Spouse Name: N/A  . Number of Children: N/A  . Years of Education: N/A   Occupational History  . Not on file.   Social History Main Topics  . Smoking status: Never Smoker   . Smokeless tobacco: Never Used  . Alcohol Use:  No  . Drug Use: No  . Sexual Activity: Not on file   Other Topics Concern  . Not on file   Social History Narrative     Review of Systems: General: negative for chills, fever, night sweats or weight changes.  Cardiovascular: negative for chest pain, dyspnea on exertion, edema, orthopnea, palpitations, paroxysmal nocturnal dyspnea or shortness of breath Dermatological: negative for rash Respiratory: negative for cough or wheezing Urologic: negative for hematuria Abdominal: negative for nausea, vomiting, diarrhea, bright red blood per rectum, melena, or hematemesis Neurologic: negative for visual changes, syncope, or dizziness All other systems reviewed and are otherwise negative except as noted above.    Blood pressure 134/78, pulse 78, height 6\' 1"  (1.854 m), weight 194 lb 12.8 oz (88.361 kg), SpO2 97 %.  General appearance: alert, cooperative and no distress Neck: no carotid bruit and no JVD Lungs: clear to auscultation bilaterally Heart: regular rate and rhythm, S1, S2 normal, no murmur, click, rub or gallop Extremities: no LEE Pulses: 2+ and symmetric Skin: warm and dry Neurologic: Grossly normal  EKG SR with PACs. No ischemia.   ASSESSMENT AND PLAN:   1. CAD: Stable. He denies any recent angina. Continue ASA, statin and beta blocker.   2. Chronic Systolic CHF: Patient notes mild exertional dyspnea and abdominal fullness and recent 4lb weight gain, improved with PRN lasix. No significant LEE on exam and no pulmonic rales of JVD. We will check a BNP today and will repeat 2D echo. Last Echo was in 2015. We will reassess LVF, wall motion and valve anatomy. Continue low sodium diet and daily weights.    3. Hypertension: BP stable and mostly controlled at home. Continue current therapy.   4. Hyperlipidemia: Continue statin. Lipids followed in primary care.   5. AAA: Followed by [facility] and now s/p aortic stent graft.   6. Mitral Regurgitation: Trivial by echo February 2015.  Will recheck echo given recent complaints.   7. Aortic valve insufficiency: Mild by echo February 2015. will recheck echo given recent complaints.    PLAN  F/u with Dr. Angelena Form in 6 months.   Dajanay Northrup PA-C 07/24/2015 11:03 AM

## 2015-07-24 NOTE — Patient Instructions (Signed)
Medication Instructions:  Your physician recommends that you continue on your current medications as directed. Please refer to the Current Medication list given to you today.   Labwork: Bnp, Bmet today  Testing/Procedures: Your physician has requested that you have an echocardiogram. Echocardiography is a painless test that uses sound waves to create images of your heart. It provides your doctor with information about the size and shape of your heart and how well your heart's chambers and valves are working. This procedure takes approximately one hour. There are no restrictions for this procedure.    Follow-Up: Your physician wants you to follow-up in: 6 months with Dr.Mcalhany You will receive a reminder letter in the mail two months in advance. If you don't receive a letter, please call our office to schedule the follow-up appointment.   Any Other Special Instructions Will Be Listed Below (If Applicable).     If you need a refill on your cardiac medications before your next appointment, please call your pharmacy.

## 2015-07-25 LAB — BRAIN NATRIURETIC PEPTIDE: BRAIN NATRIURETIC PEPTIDE: 102.1 pg/mL — AB (ref <100)

## 2015-08-22 ENCOUNTER — Ambulatory Visit (INDEPENDENT_AMBULATORY_CARE_PROVIDER_SITE_OTHER): Payer: Medicare Other | Admitting: Cardiovascular Disease

## 2015-08-22 ENCOUNTER — Ambulatory Visit: Payer: Medicare Other | Admitting: Cardiovascular Disease

## 2015-08-22 ENCOUNTER — Encounter: Payer: Self-pay | Admitting: Cardiovascular Disease

## 2015-08-22 VITALS — BP 130/88 | HR 64 | Ht 73.0 in | Wt 194.4 lb

## 2015-08-22 DIAGNOSIS — E785 Hyperlipidemia, unspecified: Secondary | ICD-10-CM

## 2015-08-22 DIAGNOSIS — I359 Nonrheumatic aortic valve disorder, unspecified: Secondary | ICD-10-CM | POA: Diagnosis not present

## 2015-08-22 DIAGNOSIS — I10 Essential (primary) hypertension: Secondary | ICD-10-CM

## 2015-08-22 DIAGNOSIS — I714 Abdominal aortic aneurysm, without rupture, unspecified: Secondary | ICD-10-CM

## 2015-08-22 DIAGNOSIS — I2510 Atherosclerotic heart disease of native coronary artery without angina pectoris: Secondary | ICD-10-CM | POA: Diagnosis not present

## 2015-08-22 DIAGNOSIS — I340 Nonrheumatic mitral (valve) insufficiency: Secondary | ICD-10-CM

## 2015-08-22 DIAGNOSIS — I255 Ischemic cardiomyopathy: Secondary | ICD-10-CM

## 2015-08-22 NOTE — Progress Notes (Signed)
Chief Complaint  Patient presents with  . Follow-up  . Cardiomyopathy  . Shortness of Breath    overall in general. even walking shorts distances      History of Present Illness: 82 yo male with history of HLD, HTN, GERD, CAD s/p CABG (1994 redo in 2005), ischemic cardiomyopathy, MR, hiatal hernia, DVT who is here today for cardiac follow up. He has been followed in the past by Dr. Lia Foyer. Redo CABG 07/04/03 with right radial graft to PDA, SVG to Circumflex. His LIMA to LAD was patent by cath. His AAA is followed by Dr. Oneida Alar in [facility]. He was admitted November 2014 with chest pain c/w angina. Cardiac cath 04/10/13 and found to have patent LIMA to LAD, patent free radial graft to PDA but occluded SVG to Circumflex system with 99% distal left main stenosis supplying the Circumflex as well as severe distal Circumflex stenosis. A 2.75 x 20 mm Promus Premier DES was deployed in the distal Circumflex extending into the second OM branch. A 3.0 x 16 mm Promus Premier DES was deployed in the distal left main extending into the proximal Circumflex. This stent was post-dilated with a 3.5 x 8 mm [place] balloon x 2. Echo February 2015 with LVEF=50%. Aortic stent graft placed October 2015 by Dr. Oneida Alar. He was seen in our office March 2017 by Ellen Henri, PA-C and had weight gain and dyspnea which responded to Lasix.   He is here today for follow up. No chest pains. He does endorse dizziness all day and mild SOB at rest, unchanged over last two years. No LE edema.    Primary Care Physician: Gilford Rile  Last Lipid Profile: Followed in primary care.   Past Medical History  Diagnosis Date  . Hyperlipidemia   . Renal calculus     history  . Other specified gastritis without mention of hemorrhage   . Spinal stenosis, unspecified region other than cervical   . Hiatal hernia     HISTORY  . Acute venous embolism and thrombosis of unspecified deep vessels of lower extremity   . Abdominal aneurysm without  mention of rupture   . Esophageal reflux   . HTN (hypertension)   . CAD (coronary artery disease)     a. s/p CABG 1994, redo 2005. b. [place] 03/2013: 2/3 patent bpg. s/p PTCA/DES to distal LM and PTCA/DES to distal LCx.  . CHF (congestive heart failure) ([place])   . Stomach pain June 2013    Hospital stay at [facility]  . Myocardial infarction ([place])   . Anxiety   . Ischemic cardiomyopathy     a. EF history: 2011: 35-40%, 11/2012: 45-50%. b. 03/2013: 35% by cath.  . Congestive heart failure (CHF) [facility])     Past Surgical History  Procedure Laterality Date  . Ptca    . Total knee arthroplasty      right  . Joint replacement  2005    Right knee  . Spine surgery      X's 3  . Coronary artery bypass graft  1994 and  2005  . Coronary angioplasty with stent placement  04/10/2013    DES LEFT MAIN DES LEFT CIRCUMFLEX    DR Cabela Pacifico  . Back surgery    . Rotator cuff repair Left   . Abdominal aortic endovascular stent graft N/A 03/14/2014    Procedure: ABDOMINAL AORTIC ENDOVASCULAR STENT GRAFT;  Surgeon: Elam Dutch, MD;  Location: [facility];  Service: Vascular;  Laterality: N/A;  . Left  heart catheterization with coronary angiogram N/A 04/10/2013    Procedure: LEFT HEART CATHETERIZATION WITH CORONARY ANGIOGRAM;  Surgeon: Burnell Blanks, MD;  Location: [facility] CATH LAB;  Service: Cardiovascular;  Laterality: N/A;  . Hiatal hernia repair    . Abdominal aortic aneurysm repair      Current Outpatient Prescriptions  Medication Sig Dispense Refill  . aspirin 81 MG tablet Take 81 mg by mouth daily.    . busPIRone (BUSPAR) 10 MG tablet Take 10 mg by mouth 2 (two) times daily as needed (anxiety).     . carvedilol (COREG) 3.125 MG tablet Take 1 tablet (3.125 mg total) by mouth 2 (two) times daily with a meal. 60 tablet 6  . finasteride (PROSCAR) 5 MG tablet Take 5 mg by mouth daily.      . furosemide (LASIX) 20 MG tablet Take 1 tablet (20 mg total) by mouth as needed for edema.    Marland Kitchen  HYDROcodone-acetaminophen (NORCO/VICODIN) 5-325 MG per tablet Take 1 tablet by mouth every 6 (six) hours as needed for moderate pain. 20 tablet 0  . lisinopril (PRINIVIL,ZESTRIL) 20 MG tablet Take 10 mg by mouth 2 (two) times daily.     . nitroGLYCERIN (NITROSTAT) 0.4 MG SL tablet Place 0.4 mg under the tongue every 5 (five) minutes as needed (MAX 3 TABLETS). For chest pain    . pantoprazole (PROTONIX) 40 MG tablet Take 40 mg by mouth daily.     . polyethylene glycol (MIRALAX / GLYCOLAX) packet Take 17 g by mouth daily as needed for mild constipation.    . simvastatin (ZOCOR) 40 MG tablet Take 20 mg by mouth at bedtime.     Marland Kitchen zolpidem (AMBIEN) 10 MG tablet Take 10 mg by mouth at bedtime as needed. For insomnia     No current facility-administered medications for this visit.    Allergies  Allergen Reactions  . Alum & Mag Hydroxide-Simeth Other (See Comments)    Unknown (Mylanta)  . Oxycodone-Acetaminophen Other (See Comments)    unknown    Social History   Social History  . Marital Status: Married    Spouse Name: N/A  . Number of Children: N/A  . Years of Education: N/A   Occupational History  . Not on file.   Social History Main Topics  . Smoking status: Never Smoker   . Smokeless tobacco: Never Used  . Alcohol Use: No  . Drug Use: No  . Sexual Activity: Not on file   Other Topics Concern  . Not on file   Social History Narrative    Family History  Problem Relation Age of Onset  . Coronary artery disease Mother   . Diabetes Mother   . Heart disease Mother   . Hyperlipidemia Mother   . Hypertension Mother   . Heart disease Father   . Hyperlipidemia Father   . Hypertension Father   . Heart attack Mother   . Heart attack Father   . Stroke Neg Hx     Review of Systems:  As stated in the HPI and otherwise negative.   BP 130/88 mmHg  Pulse 64  Ht 6\' 1"  (1.854 m)  Wt 194 lb 6.4 oz (88.179 kg)  BMI 25.65 kg/m2  SpO2 98%  Physical Examination: General: Well  developed, well nourished, NAD HEENT: OP clear, mucus membranes moist SKIN: warm, dry. No rashes. Neuro: No focal deficits Musculoskeletal: Muscle strength 5/5 all ext Psychiatric: Mood and affect normal Neck: No JVD, no carotid bruits, no thyromegaly, no  lymphadenopathy. Lungs:Clear bilaterally, no wheezes, rhonci, crackles Cardiovascular: Regular rate and rhythm. No murmurs, gallops or rubs. Abdomen:Soft. Bowel sounds present. Non-tender.  Extremities: No lower extremity edema. Pulses are 2 + in the bilateral DP/PT.  Cardiac cath 04/10/13: Left main: 99% distal stenosis.  Left Anterior Descending Artery: Large caliber vessel that courses to the apex. The vessel is occluded at the ostium. The entire proximal, mid and distal vessel including the diagonal fills from the patent IMA graft.  Circumflex Artery: Large caliber, diffusely calcified vessel. 99% ostial stenosis. 40% proximal stenosis. First obtuse marginal branch is moderate in caliber with a proximal 30% stenosis. The distal AV groove Circumflex has a long 99% stenosis just before the takeoff of the second obtuse marginal branch. The distal AV groove Circumflex beyond this becomes small in caliber and has diffuse 50% stensosis.  Right Coronary Artery: 100% proximal occlusion. The mid and distal vessel fills from the patent free radial graft.  Graft Anatomy:  Both SVG to OM system are occluded  Free radial graft to PDA is patent  LIMA graft to mid LAD is patent  Left Ventricular Angiogram: LVEF=35%. Global hypokinesis.  Impression:  1. Severe triple vessel CAD s/p 3V CABG with 2/3 patent bypass grafts.  2. Occluded vein graft to OM system with high grade disease in distal left main leading into large OM system and severe stenosis distal Circumflex.  3. Successful PTCA/DES x 1 distal Left main artery  4. Successful PTCA/DES x 1 distal left Circumflex artery  5. Moderate LV systolic dysfunction  PCI Note: The sheath was upsized to a  6 [place] system. The patient was given a weight based bolus of Angiomax and a drip was started. When the ACT was over 200, I engaged the left main with a XB 3.0 guide. I then passed a [place] IC wire down the Circumflex artery into the distal vessel. A 2.5 x 15 mm balloon was used to pre-dilate the distal left main stenosis and then the distal Circumflex into the second OM branch. A 2.75 x 20 mm Promus Premier DES was deployed in the distal Circumflex extending into the second OM branch. This was post-dilated with a 2.75 x 15 mm [place] balloon. I then focused on lesion #2. I pulled the 2.75 x 15 [place] balloon into the distal left main and pre-dilated this lesion again. I then carefully positioned and deployed a 3.0 x 16 mm Promus Premier DES in the distal left main extending into the proximal Circumflex. This stent was post-dilated with a 3.5 x 8 mm [place] balloon x 2. There was an excellent angiographic result. The guide was removed.   Echo 07/14/13: Left ventricle: Septal hypokinesis The cavity size was mildly dilated. Wall thickness was increased in a pattern of mild LVH. The estimated ejection fraction was 50%. - Aortic valve: Mild regurgitation. - Left atrium: The atrium was moderately dilated. - Atrial septum: No defect or patent foramen ovale was Identified.  EKG:  EKG is not ordered today. The ekg ordered today demonstrates   Recent Labs: 07/24/2015: BUN 17; Creat 1.06; Potassium 4.4; Sodium 138   Lipid Panel No results found for: CHOL, TRIG, HDL, CHOLHDL, VLDL, LDLCALC, LDLDIRECT   Wt Readings from Last 3 Encounters:  08/22/15 194 lb 6.4 oz (88.179 kg)  07/24/15 194 lb 12.8 oz (88.361 kg)  04/24/15 189 lb (85.73 kg)     Other studies Reviewed: Additional studies/ records that were reviewed today include: . Review of the above records demonstrates:  Assessment and Plan:   1. CAD: Stable. He has no chest pain. PCI of the circumflex and left main in November 2014.  Plavix stopped due to  excessive bleeding from hemorrhoids. Continue ASA, statin and beta blocker.   2. PACs, PVCs: Noted on telemetry while in hospital. Continue beta blocker.   3. Aortic valve insufficiency: Mild by echo February 2015. Repeat echo later this month.   4. Ischemic CM: Continue beta blocker, ACEI. Last LVEF=50% by echo 2/15.   5. Chronic Systolic CHF: Volume stable. Lasix prn.   6. Hypertension: BP stable and mostly controlled at home. At times he is hypotensive. I have encouraged him to push po fluids. Continue current therapy.   7. Hyperlipidemia: Continue statin. Lipids followed in primary care.   8. AAA: Followed by [facility] and now s/p aortic stent graft.   9. Mitral Regurgitation: Trivial by echo February 2015.   Current medicines are reviewed at length with the patient today.  The patient does not have concerns regarding medicines.  The following changes have been made:  no change  Labs/ tests ordered today include:  No orders of the defined types were placed in this encounter.     Disposition:   FU with me in 6 months   Signed, Lauree Chandler, MD 08/22/2015 3:35 PM    [facility] [place], [place], [place]  [address] Phone: [phone]; Fax: [phone]

## 2015-08-22 NOTE — Patient Instructions (Signed)

## 2015-09-02 ENCOUNTER — Ambulatory Visit (HOSPITAL_COMMUNITY): Payer: Medicare Other | Attending: Cardiology

## 2015-09-02 ENCOUNTER — Other Ambulatory Visit: Payer: Self-pay

## 2015-09-02 DIAGNOSIS — I351 Nonrheumatic aortic (valve) insufficiency: Secondary | ICD-10-CM | POA: Diagnosis not present

## 2015-09-02 DIAGNOSIS — I340 Nonrheumatic mitral (valve) insufficiency: Secondary | ICD-10-CM | POA: Diagnosis not present

## 2015-09-02 DIAGNOSIS — R635 Abnormal weight gain: Secondary | ICD-10-CM | POA: Insufficient documentation

## 2015-09-02 DIAGNOSIS — I255 Ischemic cardiomyopathy: Secondary | ICD-10-CM | POA: Diagnosis not present

## 2015-09-02 DIAGNOSIS — I509 Heart failure, unspecified: Secondary | ICD-10-CM | POA: Diagnosis not present

## 2015-09-02 DIAGNOSIS — I2510 Atherosclerotic heart disease of native coronary artery without angina pectoris: Secondary | ICD-10-CM | POA: Diagnosis not present

## 2015-09-02 DIAGNOSIS — I110 Hypertensive heart disease with heart failure: Secondary | ICD-10-CM | POA: Insufficient documentation

## 2015-09-02 DIAGNOSIS — I77810 Thoracic aortic ectasia: Secondary | ICD-10-CM | POA: Insufficient documentation

## 2015-09-02 DIAGNOSIS — R0600 Dyspnea, unspecified: Secondary | ICD-10-CM | POA: Diagnosis not present

## 2015-09-02 DIAGNOSIS — E785 Hyperlipidemia, unspecified: Secondary | ICD-10-CM | POA: Insufficient documentation

## 2015-10-18 DIAGNOSIS — R319 Hematuria, unspecified: Secondary | ICD-10-CM | POA: Insufficient documentation

## 2015-10-31 ENCOUNTER — Encounter: Payer: Self-pay | Admitting: Cardiology

## 2016-01-16 DIAGNOSIS — R0600 Dyspnea, unspecified: Secondary | ICD-10-CM | POA: Insufficient documentation

## 2016-04-16 DIAGNOSIS — I2699 Other pulmonary embolism without acute cor pulmonale: Secondary | ICD-10-CM | POA: Insufficient documentation

## 2016-04-17 DIAGNOSIS — I2699 Other pulmonary embolism without acute cor pulmonale: Secondary | ICD-10-CM

## 2016-04-17 HISTORY — DX: Other pulmonary embolism without acute cor pulmonale: I26.99

## 2016-04-30 ENCOUNTER — Ambulatory Visit: Payer: Medicare Other | Admitting: Vascular Surgery

## 2016-04-30 ENCOUNTER — Encounter: Payer: Self-pay | Admitting: Vascular Surgery

## 2016-04-30 ENCOUNTER — Encounter (HOSPITAL_COMMUNITY): Payer: Medicare Other

## 2016-05-05 ENCOUNTER — Telehealth: Payer: Self-pay

## 2016-05-05 NOTE — Telephone Encounter (Signed)
-----   Message from Donzetta Matters sent at 05/05/2016  9:47 AM EST ----- Regarding: Canceled Appointment This patient had an appointment for 12/21 for a 1 yr follow up. He came in to cancel his appointment and stated that he wanted to wait 3 more months to be seen since his PCP is currently treating a blood clot in his lung with Xarelto and he wanted to wait a few month to see if it will fix the problem. I am just a little concerned about him canceling his appointment. I mentioned that I would forward this info to the clinical staff and someone will call him if we had any questions.   Thank you

## 2016-05-05 NOTE — Telephone Encounter (Signed)
Attempted to call pt. To inquire if having any symptoms in relation to his EVAR.  Left message to call office in return.

## 2016-05-07 ENCOUNTER — Encounter (HOSPITAL_COMMUNITY): Payer: Medicare Other

## 2016-05-07 ENCOUNTER — Ambulatory Visit: Payer: Medicare Other | Admitting: Vascular Surgery

## 2016-05-07 NOTE — Telephone Encounter (Signed)
Discussed pt's situation with Dr. Oneida Alar.  Per Dr. Oneida Alar, the pt. should keep the surveillance scan, due to risk of further problems with EVAR.  Notified the pt's wife.  Verb. Understanding.  Advised will have a scheduler contact for rescheduling the appts.  Verb. understanding.

## 2016-05-07 NOTE — Telephone Encounter (Signed)
Phone call returned to pt.  Inquired about any symptoms he may be having related to hx of EVAR in 2015, as he has cancelled his surveillance appt., due to another medical problem.   Reported he is being treated for a "left lung blood clot, with Xarelto."  Stated it was diagnosed about 3 weeks ago.  Reported he wanted to wait to have the situation with his lung stabilize, before he follows-up for surveillance on aneurysm repair.   Questioned if he was experiencing any abdominal or back pain?  Reported he does have back pain, but not sure if it is related to the pulmonary embolus.  Reported he will call the office to resched. the AAA surveillance appt.  Encouraged to call office if he develops any new or worsening symptoms of abdominal or back pain.  Verb. Understanding.

## 2016-06-05 ENCOUNTER — Ambulatory Visit: Payer: Medicare Other | Admitting: Cardiovascular Disease

## 2016-06-08 ENCOUNTER — Encounter: Payer: Self-pay | Admitting: Cardiovascular Disease

## 2016-06-08 ENCOUNTER — Ambulatory Visit (INDEPENDENT_AMBULATORY_CARE_PROVIDER_SITE_OTHER): Payer: Medicare Other | Admitting: Cardiovascular Disease

## 2016-06-08 VITALS — BP 150/90 | HR 67 | Ht 74.0 in | Wt 198.2 lb

## 2016-06-08 DIAGNOSIS — I359 Nonrheumatic aortic valve disorder, unspecified: Secondary | ICD-10-CM | POA: Diagnosis not present

## 2016-06-08 DIAGNOSIS — I714 Abdominal aortic aneurysm, without rupture, unspecified: Secondary | ICD-10-CM

## 2016-06-08 DIAGNOSIS — I340 Nonrheumatic mitral (valve) insufficiency: Secondary | ICD-10-CM

## 2016-06-08 DIAGNOSIS — I2510 Atherosclerotic heart disease of native coronary artery without angina pectoris: Secondary | ICD-10-CM

## 2016-06-08 DIAGNOSIS — I10 Essential (primary) hypertension: Secondary | ICD-10-CM | POA: Diagnosis not present

## 2016-06-08 DIAGNOSIS — E7800 Pure hypercholesterolemia, unspecified: Secondary | ICD-10-CM

## 2016-06-08 DIAGNOSIS — I255 Ischemic cardiomyopathy: Secondary | ICD-10-CM

## 2016-06-08 NOTE — Patient Instructions (Addendum)
Medication Instructions:  Your physician recommends that you continue on your current medications as directed. Please refer to the Current Medication list given to you today.   Labwork: none  Testing/Procedures: none  Follow-Up: Your physician recommends that you schedule a follow-up appointment in: 6 months.  Please call our office in about 3 months to schedule this appt   Any Other Special Instructions Will Be Listed Below (If Applicable).     If you need a refill on your cardiac medications before your next appointment, please call your pharmacy.

## 2016-06-08 NOTE — Progress Notes (Signed)
Chief Complaint  Patient presents with  . Follow-up    6 month follow up      History of Present Illness: 83 yo male with history of HLD, HTN, GERD, CAD s/p CABG (1994 redo in 2005), ischemic cardiomyopathy, MR, hiatal hernia, DVT who is here today for cardiac follow up. He has been followed in the past by Dr. Lia Foyer. Redo CABG 07/04/03 with right radial graft to PDA, SVG to Circumflex. His LIMA to LAD was patent by cath. His AAA is followed by Dr. Oneida Alar in [facility]. He was admitted November 2014 with chest pain c/w angina. Cardiac cath 04/10/13 and found to have patent LIMA to LAD, patent free radial graft to PDA but occluded SVG to Circumflex system with 99% distal left main stenosis supplying the Circumflex as well as severe distal Circumflex stenosis. A 2.75 x 20 mm Promus Premier DES was deployed in the distal Circumflex extending into the second OM branch. A 3.0 x 16 mm Promus Premier DES was deployed in the distal left main extending into the proximal Circumflex. This stent was post-dilated with a 3.5 x 8 mm [place] balloon x 2. Aortic stent graft placed October 2015 by Dr. Oneida Alar. He was seen in our office March 2017 by Ellen Henri, PA-C and had weight gain and dyspnea which responded to Lasix. Echo April 2017 with LVEF=45-50%, moderate MR. He was diagnosed with a PE in December 2017 and is now on Xarelto.   He is here today for follow up. No chest pains or dyspnea. No LE edema.  Feeling well overall.   Primary Care Physician: Gilford Rile, MD  Past Medical History:  Diagnosis Date  . Abdominal aneurysm without mention of rupture   . Acute venous embolism and thrombosis of unspecified deep vessels of lower extremity   . Anxiety   . CAD (coronary artery disease)    a. s/p CABG 1994, redo 2005. b. [place] 03/2013: 2/3 patent bpg. s/p PTCA/DES to distal LM and PTCA/DES to distal LCx.  . CHF (congestive heart failure) ([place])   . Congestive heart failure (CHF) ([place])   . Esophageal reflux   .  Hiatal hernia    HISTORY  . HTN (hypertension)   . Hyperlipidemia   . Ischemic cardiomyopathy    a. EF history: 2011: 35-40%, 11/2012: 45-50%. b. 03/2013: 35% by cath.  . Myocardial infarction   . Other specified gastritis without mention of hemorrhage   . Renal calculus    history  . Spinal stenosis, unspecified region other than cervical   . Stomach pain June 2013   Hospital stay at [facility]    Past Surgical History:  Procedure Laterality Date  . ABDOMINAL AORTIC ANEURYSM REPAIR    . ABDOMINAL AORTIC ENDOVASCULAR STENT GRAFT N/A 03/14/2014   Procedure: ABDOMINAL AORTIC ENDOVASCULAR STENT GRAFT;  Surgeon: Elam Dutch, MD;  Location: [place];  Service: Vascular;  Laterality: N/A;  . BACK SURGERY    . CORONARY ANGIOPLASTY WITH STENT PLACEMENT  04/10/2013   DES LEFT MAIN DES LEFT CIRCUMFLEX    DR [place]  . CORONARY ARTERY BYPASS GRAFT  1994 and  2005  . HIATAL HERNIA REPAIR    . JOINT REPLACEMENT  2005   Right knee  . LEFT HEART CATHETERIZATION WITH CORONARY ANGIOGRAM N/A 04/10/2013   Procedure: LEFT HEART CATHETERIZATION WITH CORONARY ANGIOGRAM;  Surgeon: Burnell Blanks, MD;  Location: [facility] CATH LAB;  Service: Cardiovascular;  Laterality: N/A;  . PTCA    . ROTATOR CUFF REPAIR  Left   . SPINE SURGERY     X's 3  . TOTAL KNEE ARTHROPLASTY     right    Current Outpatient Prescriptions  Medication Sig Dispense Refill  . aspirin 81 MG tablet Take 81 mg by mouth daily.    . busPIRone (BUSPAR) 10 MG tablet Take 10 mg by mouth 2 (two) times daily as needed (anxiety).     . carvedilol (COREG) 3.125 MG tablet Take 1 tablet (3.125 mg total) by mouth 2 (two) times daily with a meal. 60 tablet 6  . finasteride (PROSCAR) 5 MG tablet Take 5 mg by mouth daily.      . furosemide (LASIX) 20 MG tablet Take 1 tablet (20 mg total) by mouth as needed for edema.    Marland Kitchen HYDROcodone-acetaminophen (NORCO/VICODIN) 5-325 MG per tablet Take 1 tablet by mouth every 6 (six) hours as needed for moderate  pain. 20 tablet 0  . lisinopril (PRINIVIL,ZESTRIL) 20 MG tablet Take 10 mg by mouth 2 (two) times daily.     . nitroGLYCERIN (NITROSTAT) 0.4 MG SL tablet Place 0.4 mg under the tongue every 5 (five) minutes as needed (MAX 3 TABLETS). For chest pain    . pantoprazole (PROTONIX) 40 MG tablet Take 40 mg by mouth daily.     . polyethylene glycol (MIRALAX / GLYCOLAX) packet Take 17 g by mouth daily as needed for mild constipation.    . rivaroxaban (XARELTO) 20 MG TABS tablet Take 20 mg by mouth daily.    . simvastatin (ZOCOR) 40 MG tablet Take 20 mg by mouth at bedtime.     Marland Kitchen zolpidem (AMBIEN) 10 MG tablet Take 10 mg by mouth at bedtime as needed. For insomnia     No current facility-administered medications for this visit.     Allergies  Allergen Reactions  . Alum & Mag Hydroxide-Simeth Other (See Comments)    Unknown (Mylanta)  . Oxycodone-Acetaminophen Other (See Comments)    unknown    Social History   Social History  . Marital status: Married    Spouse name: N/A  . Number of children: N/A  . Years of education: N/A   Occupational History  . Not on file.   Social History Main Topics  . Smoking status: Never Smoker  . Smokeless tobacco: Never Used  . Alcohol use No  . Drug use: No  . Sexual activity: Not on file   Other Topics Concern  . Not on file   Social History Narrative  . No narrative on file    Family History  Problem Relation Age of Onset  . Coronary artery disease Mother   . Diabetes Mother   . Heart disease Mother   . Hyperlipidemia Mother   . Hypertension Mother   . Heart disease Father   . Hyperlipidemia Father   . Hypertension Father   . Heart attack Mother   . Heart attack Father   . Stroke Neg Hx     Review of Systems:  As stated in the HPI and otherwise negative.   BP (!) 150/90   Pulse 67   Ht 6\' 2"  (1.88 m)   Wt 198 lb 3.2 oz (89.9 kg)   BMI 25.45 kg/m   Physical Examination: General: Well developed, well nourished, NAD  HEENT:  OP clear, mucus membranes moist  SKIN: warm, dry. No rashes. Neuro: No focal deficits  Musculoskeletal: Muscle strength 5/5 all ext  Psychiatric: Mood and affect normal  Neck: No JVD, no carotid bruits, no  thyromegaly, no lymphadenopathy.  Lungs:Clear bilaterally, no wheezes, rhonci, crackles Cardiovascular: Regular rate and rhythm. No murmurs, gallops or rubs. Abdomen:Soft. Bowel sounds present. Non-tender.  Extremities: No lower extremity edema. Pulses are 2 + in the bilateral DP/PT.  Cardiac cath 04/10/13: Left main: 99% distal stenosis.  Left Anterior Descending Artery: Large caliber vessel that courses to the apex. The vessel is occluded at the ostium. The entire proximal, mid and distal vessel including the diagonal fills from the patent IMA graft.  Circumflex Artery: Large caliber, diffusely calcified vessel. 99% ostial stenosis. 40% proximal stenosis. First obtuse marginal branch is moderate in caliber with a proximal 30% stenosis. The distal AV groove Circumflex has a long 99% stenosis just before the takeoff of the second obtuse marginal branch. The distal AV groove Circumflex beyond this becomes small in caliber and has diffuse 50% stensosis.  Right Coronary Artery: 100% proximal occlusion. The mid and distal vessel fills from the patent free radial graft.  Graft Anatomy:  Both SVG to OM system are occluded  Free radial graft to PDA is patent  LIMA graft to mid LAD is patent  Left Ventricular Angiogram: LVEF=35%. Global hypokinesis.  Impression:  1. Severe triple vessel CAD s/p 3V CABG with 2/3 patent bypass grafts.  2. Occluded vein graft to OM system with high grade disease in distal left main leading into large OM system and severe stenosis distal Circumflex.  3. Successful PTCA/DES x 1 distal Left main artery  4. Successful PTCA/DES x 1 distal left Circumflex artery  5. Moderate LV systolic dysfunction  PCI Note: The sheath was upsized to a 6 [place] system. The patient was  given a weight based bolus of Angiomax and a drip was started. When the ACT was over 200, I engaged the left main with a XB 3.0 guide. I then passed a [place]ougar IC wire down the Circumflex artery into the distal vessel. A 2.5 x 15 mm balloon was used to pre-dilate the distal left main stenosis and then the distal Circumflex into the second OM branch. A 2.75 x 20 mm Promus Premier DES was deployed in the distal Circumflex extending into the second OM branch. This was post-dilated with a 2.75 x 15 mm [place] balloon. I then focused on lesion #2. I pulled the 2.75 x 15 [place] balloon into the distal left main and pre-dilated this lesion again. I then carefully positioned and deployed a 3.0 x 16 mm Promus Premier DES in the distal left main extending into the proximal Circumflex. This stent was post-dilated with a 3.5 x 8 mm  balloon x 2. There was an excellent angiographic result. The guide was removed.   Echo April 2017: Left ventricle: The cavity size was normal. Wall thickness was   increased in a pattern of moderate LVH. Apical septal   hypokinesis. Inferior hypokinesis. Systolic function was mildly   reduced. The estimated ejection fraction was in the range of 45%   to 50%. Doppler parameters are consistent with abnormal left   ventricular relaxation (grade 1 diastolic dysfunction). - Aortic valve: Trileaflet; moderately calcified leaflets. There   was no stenosis. There was trivial regurgitation. - Aorta: Ascending aortic diameter: 41 mm (S). - Ascending aorta: The ascending aorta was mildly dilated. - Mitral valve: Mildly to moderately calcified annulus. There was   moderate regurgitation. - Left atrium: The atrium was mildly dilated. - Right ventricle: The cavity size was normal. Systolic function   was normal. - Tricuspid valve: Peak RV-RA gradient (S): 22  mm Hg. - Pulmonary arteries: PA peak pressure: 25 mm Hg (S). - Inferior vena cava: The vessel was normal in size. The   respirophasic diameter  changes were in the normal range (>= 50%),   consistent with normal central venous pressure.  Impressions:  - Normal LV size with moderate LV hypertrophy. EF 45-50% with   inferior and apical septal hypokinesis. Normal RV size and   systolic function. Moderate mitral regurgitation.  EKG:  EKG is ordered today. The ekg ordered today demonstrates sinus, rate 67 bpm. LVH. Q waves inferior leads, chronic  Recent Labs: 07/24/2015: Brain Natriuretic Peptide 102.1; BUN 17; Creat 1.06; Potassium 4.4; Sodium 138   Lipid Panel No results found for: CHOL, TRIG, HDL, CHOLHDL, VLDL, LDLCALC, LDLDIRECT   Wt Readings from Last 3 Encounters:  06/08/16 198 lb 3.2 oz (89.9 kg)  08/22/15 194 lb 6.4 oz (88.2 kg)  07/24/15 194 lb 12.8 oz (88.4 kg)     Other studies Reviewed: Additional studies/ records that were reviewed today include: . Review of the above records demonstrates:    Assessment and Plan:   1. CAD: No chest pain suggestive of angina. He has no chest pain. PCI of the circumflex and left main in November 2014.  Plavix stopped due to excessive bleeding from hemorrhoids. Continue ASA, statin and beta blocker.   2. PACs, PVCs: Noted on telemetry while in hospital. Continue beta blocker.   3. Aortic valve insufficiency: Trivial by echo April 2017.   4. Ischemic CM: Continue beta blocker, ACEI. Last LVEF=45-50% by echo April 2017.  5. Chronic Systolic CHF: Volume stable. Lasix prn.   6. Hypertension: BP stable and controlled at home. Continue current therapy. Follow at home  7. Hyperlipidemia: Continue statin. Lipids followed in primary care.   8. AAA: Followed by [facility] and now s/p aortic stent graft.   9. Mitral Regurgitation: Moderate by echo April 2017.    10. Pulmonary embolism: He is now on Xarelto. This is being managed in primary care.   Current medicines are reviewed at length with the patient today.  The patient does not have concerns regarding medicines.  The following  changes have been made:  no change  Labs/ tests ordered today include:   Orders Placed This Encounter  Procedures  . EKG 12-Lead     Disposition:   FU with me in 6 months   Signed, Lauree Chandler, MD 06/08/2016 1:43 PM    [facility] [address], [place], [place]  [address] Phone: [phone]; Fax: [phone]

## 2016-06-15 ENCOUNTER — Encounter: Payer: Self-pay | Admitting: Vascular Surgery

## 2016-06-18 ENCOUNTER — Encounter: Payer: Self-pay | Admitting: Vascular Surgery

## 2016-06-18 ENCOUNTER — Ambulatory Visit (HOSPITAL_COMMUNITY)
Admission: RE | Admit: 2016-06-18 | Discharge: 2016-06-18 | Disposition: A | Discharge status: Home / Self Care | Payer: Medicare Other | Source: Ambulatory Visit | Attending: Vascular Surgery | Admitting: Vascular Surgery

## 2016-06-18 ENCOUNTER — Ambulatory Visit (INDEPENDENT_AMBULATORY_CARE_PROVIDER_SITE_OTHER): Payer: Medicare Other | Admitting: Vascular Surgery

## 2016-06-18 VITALS — BP 130/81 | HR 93 | Temp 97.4°F | Resp 18 | Ht 74.0 in | Wt 199.0 lb

## 2016-06-18 DIAGNOSIS — I714 Abdominal aortic aneurysm, without rupture, unspecified: Secondary | ICD-10-CM

## 2016-06-18 DIAGNOSIS — I724 Aneurysm of artery of lower extremity: Secondary | ICD-10-CM

## 2016-06-18 DIAGNOSIS — I739 Peripheral vascular disease, unspecified: Secondary | ICD-10-CM | POA: Diagnosis not present

## 2016-06-18 NOTE — Progress Notes (Signed)
[facility]   History of present illness: 83 y/o male here for his 2.2 year follow up s/p EVAR October 2015 .  He is doing well without complaints of back pain.  he has a known type II endoleak. Last abdominal aortic aneurysm measurement was 5.7 cm. his aneurysm was 5.3 cm prior to repair. He also has a known 1 cm left popliteal aneurysm.  He does have symptoms which sound consistent with neuropathy with burning and stinging circumferentially from the knee down on both legs. He denies claudication symptoms. Today he primarily complains of pain in his right leg. He says the pain is in his right hip knee and foot. He also recently was started on Eliquis after a recent pulmonary embolism a few months ago. Other medical problems include coronary artery disease hypertension and congestive failure all of which are currently been stable.        Past Medical History  Diagnosis Date  . Hyperlipidemia    . Renal calculus        history  . Other specified gastritis without mention of hemorrhage    . Spinal stenosis, unspecified region other than cervical    . Hiatal hernia        HISTORY  . Acute venous embolism and thrombosis of unspecified deep vessels of lower extremity    . Abdominal aneurysm without mention of rupture    . Esophageal reflux    . HTN (hypertension)    . CAD (coronary artery disease)        a. s/p CABG 1994, redo 2005. b. [place] 03/2013: 2/3 patent bpg. s/p PTCA/DES to distal LM and PTCA/DES to distal LCx.  . CHF (congestive heart failure) ([place])    . Stomach pain June 2013      Hospital stay at [facility]  . Myocardial infarction ([place])    . Anxiety    . Ischemic cardiomyopathy        a. EF history: 2011: 35-40%, 11/2012: 45-50%. b. 03/2013: 35% by cath.  . Congestive heart failure (CHF) [facility])      Past Surgical History  Procedure Laterality Date  . Ptca      . Total knee arthroplasty          right  . Joint replacement   2005      Right knee  . Spine  surgery          X's 3  . Coronary artery bypass graft   1994 and  2005  . Coronary angioplasty with stent placement   04/10/2013      DES LEFT MAIN DES LEFT CIRCUMFLEX    DR [place]  . Back surgery      . Rotator cuff repair Left    . Abdominal aortic endovascular stent graft N/A 03/14/2014      Procedure: ABDOMINAL AORTIC ENDOVASCULAR STENT GRAFT;  Surgeon: Elam Dutch, MD;  Location: [place];  Service: Vascular;  Laterality: N/A;  . Left heart catheterization with coronary angiogram N/A 04/10/2013      Procedure: LEFT HEART CATHETERIZATION WITH CORONARY ANGIOGRAM;  Surgeon: Burnell Blanks, MD;  Location: [facility] CATH LAB;  Service: Cardiovascular;  Laterality: N/A;  . Hiatal hernia repair      . Abdominal aortic aneurysm repair              Current Outpatient Prescriptions on File Prior to Visit  Medication Sig Dispense Refill  . aspirin 81 MG tablet Take 81 mg by  mouth daily.      . busPIRone (BUSPAR) 10 MG tablet Take 10 mg by mouth 2 (two) times daily as needed (anxiety).       . carvedilol (COREG) 3.125 MG tablet Take 1 tablet (3.125 mg total) by mouth 2 (two) times daily with a meal. 60 tablet 6  . finasteride (PROSCAR) 5 MG tablet Take 5 mg by mouth daily.        . furosemide (LASIX) 20 MG tablet Take 1 tablet (20 mg total) by mouth as needed for edema.      Marland Kitchen HYDROcodone-acetaminophen (NORCO/VICODIN) 5-325 MG per tablet Take 1 tablet by mouth every 6 (six) hours as needed for moderate pain. 20 tablet 0  . lisinopril (PRINIVIL,ZESTRIL) 20 MG tablet Take 10 mg by mouth 2 (two) times daily.       . nitroGLYCERIN (NITROSTAT) 0.4 MG SL tablet Place 0.4 mg under the tongue every 5 (five) minutes as needed (MAX 3 TABLETS). For chest pain      . pantoprazole (PROTONIX) 40 MG tablet Take 40 mg by mouth daily.       . polyethylene glycol (MIRALAX / GLYCOLAX) packet Take 17 g by mouth daily as needed for mild constipation.      . simvastatin (ZOCOR) 40 MG tablet Take 20 mg by mouth  at bedtime.       Marland Kitchen zolpidem (AMBIEN) 10 MG tablet Take 10 mg by mouth at bedtime as needed. For insomnia      . cloNIDine (CATAPRES) 0.2 MG tablet Take 0.1 mg by mouth 2 (two) times daily.         No current facility-administered medications on file prior to visit.           Allergies  Allergen Reactions  . Alum & Mag Hydroxide-Simeth Other (See Comments)      Unknown (Mylanta)  . Oxycodone-Acetaminophen Other (See Comments)      unknown        Physical exam:    Vitals:   06/18/16 1237  BP: 130/81  Pulse: 93  Resp: 18  Temp: 97.4 F (36.3 C)  TempSrc: Oral  SpO2: 95%  Weight: 199 lb (90.3 kg)  Height: 6\' 2"  (1.88 m)     Heart regular rate and rhythm Lungs CTA No wheezing 2+ femoral pulses Palpable DP Left side absent pedal pulses right side palpable popliteal pulses bilaterally Abdominal soft nontender no palpable mass     CTA 04/17/2014: Type II endo leak measuring 5.9 X 5.3 Prior to that graft repair it measured 5.3 x 5.2, reviewed the patient's CT scan from October 2016 again from [facility] which again shows a late type II endoleak   Ultrasound popliteal arteries today. 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 The patient has now had 6 mm of growth to his aneurysm since repair over the last 2 years. There does not appear to be any proximal or distal type I endoleak. Most likely this expansion is related to the type II endoleak. I discussed this with the patient and his wife today. I will refer him to Dr. Ronnell Guadalajara with interventional radiology to see if we can coil embolize this endoleak.  As far as the patient's left popliteal aneurysm is concerned it is 1 cm in diameter and I would not consider repair of this and  most reaches at least 2 cm in diameter. We'll probably get another scan of  this in a year or 2. He currently is asymptomatic.     Ruta Hinds, MD [facility] Office: [phone] Pager: [phone]

## 2016-06-19 ENCOUNTER — Other Ambulatory Visit: Payer: Self-pay | Admitting: Vascular Surgery

## 2016-06-19 DIAGNOSIS — IMO0002 Reserved for concepts with insufficient information to code with codable children: Secondary | ICD-10-CM

## 2016-06-19 DIAGNOSIS — T82330A Leakage of aortic (bifurcation) graft (replacement), initial encounter: Secondary | ICD-10-CM

## 2016-07-01 ENCOUNTER — Ambulatory Visit
Admission: RE | Admit: 2016-07-01 | Discharge: 2016-07-01 | Disposition: A | Discharge status: Home / Self Care | Payer: Medicare Other | Source: Ambulatory Visit | Attending: Vascular Surgery | Admitting: Vascular Surgery

## 2016-07-01 ENCOUNTER — Encounter: Payer: Self-pay | Admitting: Radiology

## 2016-07-01 DIAGNOSIS — T82330A Leakage of aortic (bifurcation) graft (replacement), initial encounter: Secondary | ICD-10-CM

## 2016-07-01 DIAGNOSIS — IMO0002 Reserved for concepts with insufficient information to code with codable children: Secondary | ICD-10-CM

## 2016-07-01 HISTORY — PX: IR GENERIC HISTORICAL: IMG1180011

## 2016-07-01 NOTE — Consult Note (Signed)
Chief Complaint: Patient was seen in consultation today for  Chief Complaint  Patient presents with  . Advice Only    Consult for Type 2 Endoleak post EVAR   at the request of Fields,Charles E  Referring Physician(s): Fields,Charles E  History of Present Illness: Jonathan Arroyo is a 83 y.o. male with a history of abdominal aortic aneurysm s/p EVAR October 2015.  He has a known type II endoleak. Last abdominal aortic aneurysm measurement was 5.7 cm. his aneurysm was 5.3 cm prior to repair.  This constitutes an approximately 6 mm growth in the excluded aneurysm sac over the last 2 years.  Jonathan Arroyo is largely asymptomatic. He denies back pain, abdominal pain, chest pain or shortness of breath.  He does have chronic issues with bilateral lower extremity neuropathy.  Past Medical History:  Diagnosis Date  . Abdominal aneurysm without mention of rupture   . Acute venous embolism and thrombosis of unspecified deep vessels of lower extremity   . Anxiety   . CAD (coronary artery disease)    a. s/p CABG 1994, redo 2005. b. [place] 03/2013: 2/3 patent bpg. s/p PTCA/DES to distal LM and PTCA/DES to distal LCx.  . CHF (congestive heart failure) ([place])   . Congestive heart failure (CHF) ([place])   . Esophageal reflux   . Hiatal hernia    HISTORY  . HTN (hypertension)   . Hyperlipidemia   . Ischemic cardiomyopathy    a. EF history: 2011: 35-40%, 11/2012: 45-50%. b. 03/2013: 35% by cath.  . Myocardial infarction   . Other specified gastritis without mention of hemorrhage   . Renal calculus    history  . Spinal stenosis, unspecified region other than cervical   . Stomach pain June 2013   Hospital stay at [facility]    Past Surgical History:  Procedure Laterality Date  . ABDOMINAL AORTIC ANEURYSM REPAIR    . ABDOMINAL AORTIC ENDOVASCULAR STENT GRAFT N/A 03/14/2014   Procedure: ABDOMINAL AORTIC ENDOVASCULAR STENT GRAFT;  Surgeon: Elam Dutch, MD;  Location: [place];  Service: Vascular;   Laterality: N/A;  . BACK SURGERY    . CORONARY ANGIOPLASTY WITH STENT PLACEMENT  04/10/2013   DES LEFT MAIN DES LEFT CIRCUMFLEX    DR [place]  . CORONARY ARTERY BYPASS GRAFT  1994 and  2005  . HIATAL HERNIA REPAIR    . IR GENERIC HISTORICAL  07/01/2016   IR RADIOLOGIST EVAL & MGMT 07/01/2016 Jacqulynn Cadet, MD [facility] INTERV RAD  . JOINT REPLACEMENT  2005   Right knee  . LEFT HEART CATHETERIZATION WITH CORONARY ANGIOGRAM N/A 04/10/2013   Procedure: LEFT HEART CATHETERIZATION WITH CORONARY ANGIOGRAM;  Surgeon: Burnell Blanks, MD;  Location: [facility] CATH LAB;  Service: Cardiovascular;  Laterality: N/A;  . PTCA    . ROTATOR CUFF REPAIR Left   . SPINE SURGERY     X's 3  . TOTAL KNEE ARTHROPLASTY     right    Allergies: Alum & mag hydroxide-simeth and Oxycodone-acetaminophen  Medications: Prior to Admission medications   Medication Sig Start Date End Date Taking? Authorizing Provider  aspirin 81 MG tablet Take 81 mg by mouth daily.   Yes Historical Provider, MD  busPIRone (BUSPAR) 10 MG tablet Take 10 mg by mouth 2 (two) times daily as needed (anxiety).    Yes Historical Provider, MD  carvedilol (COREG) 3.125 MG tablet Take 1 tablet (3.125 mg total) by mouth 2 (two) times daily with a meal. 04/11/13  Yes Dayna N  Dunn, PA-C  finasteride (PROSCAR) 5 MG tablet Take 5 mg by mouth daily.     Yes Historical Provider, MD  furosemide (LASIX) 20 MG tablet Take 1 tablet (20 mg total) by mouth as needed for edema. 02/12/15  Yes Imogene Burn, PA-C  HYDROcodone-acetaminophen (NORCO/VICODIN) 5-325 MG per tablet Take 1 tablet by mouth every 6 (six) hours as needed for moderate pain. 03/15/14  Yes Kimberly A Trinh, PA-C  lisinopril (PRINIVIL,ZESTRIL) 20 MG tablet Take 10 mg by mouth 2 (two) times daily.  12/24/10  Yes Hillary Bow, MD  nitroGLYCERIN (NITROSTAT) 0.4 MG SL tablet Place 0.4 mg under the tongue every 5 (five) minutes as needed (MAX 3 TABLETS). For chest pain   Yes Historical  Provider, MD  pantoprazole (PROTONIX) 40 MG tablet Take 40 mg by mouth daily.    Yes Historical Provider, MD  polyethylene glycol (MIRALAX / GLYCOLAX) packet Take 17 g by mouth daily as needed for mild constipation.   Yes Historical Provider, MD  rivaroxaban (XARELTO) 20 MG TABS tablet Take 20 mg by mouth daily. 04/16/16  Yes Historical Provider, MD  simvastatin (ZOCOR) 40 MG tablet Take 20 mg by mouth at bedtime.    Yes Historical Provider, MD  zolpidem (AMBIEN) 10 MG tablet Take 10 mg by mouth at bedtime as needed. For insomnia   Yes Historical Provider, MD     Family History  Problem Relation Age of Onset  . Coronary artery disease Mother   . Diabetes Mother   . Heart disease Mother   . Hyperlipidemia Mother   . Hypertension Mother   . Heart attack Mother   . Heart disease Father   . Hyperlipidemia Father   . Hypertension Father   . Heart attack Father   . Stroke Neg Hx     Social History   Social History  . Marital status: Married    Spouse name: N/A  . Number of children: N/A  . Years of education: N/A   Social History Main Topics  . Smoking status: Never Smoker  . Smokeless tobacco: Never Used  . Alcohol use No  . Drug use: No  . Sexual activity: Not on file   Other Topics Concern  . Not on file   Social History Narrative  . No narrative on file     Review of Systems: A 12 point ROS discussed and pertinent positives are indicated in the HPI above.  All other systems are negative.  Review of Systems  Vital Signs: BP (!) 167/104 (BP Location: Left Arm, Patient Position: Sitting, Cuff Size: Normal)   Pulse 71   Temp 98.2 F (36.8 C) (Oral)   Ht 6\' 2"  (1.88 m)   Wt 195 lb (88.5 kg)   SpO2 98%   BMI 25.04 kg/m   Physical Exam  Constitutional: He appears well-developed and well-nourished. No distress.  HENT:  Head: Normocephalic and atraumatic.  Eyes: No scleral icterus.  Cardiovascular: Normal rate and regular rhythm.   Pulmonary/Chest: Effort  normal.  Abdominal: Soft. He exhibits no distension. There is no tenderness.  Neurological: He is alert.  Skin: Skin is warm and dry.  Psychiatric: He has a normal mood and affect. His behavior is normal.  Nursing note and vitals reviewed.   Imaging: Ir Radiologist Eval & Mgmt  Result Date: 07/01/2016 Please refer to "Notes" to see consult details.   Labs:  CBC: No results for input(s): WBC, HGB, HCT, PLT in the last 8760 hours.  COAGS: No  results for input(s): INR, APTT in the last 8760 hours.  BMP:  Recent Labs  07/24/15 1159  NA 138  K 4.4  CL 101  CO2 27  GLUCOSE 80  BUN 17  CALCIUM 9.5  CREATININE 1.06    LIVER FUNCTION TESTS: No results for input(s): BILITOT, AST, ALT, ALKPHOS, PROT, ALBUMIN in the last 8760 hours.  TUMOR MARKERS: No results for input(s): AFPTM, CEA, CA199, CHROMGRNA in the last 8760 hours.  Assessment and Plan:  Pleasant 83 year old gentleman with a somewhat atypical type II endoleak. I have reviewed all of his imaging and I definitely agree that the excluded aneurysm sac is enlarging over time. However, the endoleak is extremely subtle but likely arises from either the inferior mesenteric or L4 lumbar arteries.  There is some possibility that the endoleak is resolving/resolved and the interval growth may not progress any further. However, it is definitely worthwhile to pursue a dedicated angiogram to completely evaluate in real time for any underlying endoleak. If an endoleak is identified, we will attempt endovascular repair. If an endoleak is identified but endovascular repair is not possible, I will bring him back for an attempt at direct sac puncture under general anesthesia.  Finally, if no type II endoleak be identified angiographically, continued surveillance may be his best option.  I explained all of this to both he and his wife. They understand and are in agreement with the plan.  1.) Schedule for angiogram and possible type II  endoleak repair to be performed at [facility].  Repair would likely entail a combination of coil and liquid (Onyx) embolization.    Thank you for this interesting consult.  I greatly enjoyed meeting Jonathan Arroyo and look forward to participating in their care.  A copy of this report was sent to the requesting provider on this date.  Electronically Signed: Jacqulynn Cadet 07/01/2016, 1:46 PM   I spent a total of  40 Minutes in face to face in clinical consultation, greater than 50% of which was counseling/coordinating care for type 2 endoleak

## 2016-07-14 ENCOUNTER — Other Ambulatory Visit (HOSPITAL_COMMUNITY): Payer: Self-pay | Admitting: Interventional Radiology

## 2016-07-14 DIAGNOSIS — I714 Abdominal aortic aneurysm, without rupture, unspecified: Secondary | ICD-10-CM

## 2016-07-23 ENCOUNTER — Other Ambulatory Visit: Payer: Self-pay | Admitting: Radiology

## 2016-07-23 ENCOUNTER — Other Ambulatory Visit: Payer: Self-pay | Admitting: Student

## 2016-07-24 ENCOUNTER — Other Ambulatory Visit: Payer: Self-pay | Admitting: General Surgery

## 2016-07-24 ENCOUNTER — Other Ambulatory Visit (HOSPITAL_COMMUNITY): Payer: Self-pay | Admitting: Interventional Radiology

## 2016-07-24 ENCOUNTER — Encounter (HOSPITAL_COMMUNITY): Payer: Self-pay

## 2016-07-24 ENCOUNTER — Ambulatory Visit (HOSPITAL_COMMUNITY)
Admission: RE | Admit: 2016-07-24 | Discharge: 2016-07-24 | Disposition: A | Discharge status: Home / Self Care | Payer: Medicare Other | Source: Ambulatory Visit | Attending: Interventional Radiology | Admitting: Interventional Radiology

## 2016-07-24 DIAGNOSIS — I255 Ischemic cardiomyopathy: Secondary | ICD-10-CM | POA: Diagnosis not present

## 2016-07-24 DIAGNOSIS — Z833 Family history of diabetes mellitus: Secondary | ICD-10-CM | POA: Insufficient documentation

## 2016-07-24 DIAGNOSIS — I701 Atherosclerosis of renal artery: Secondary | ICD-10-CM | POA: Insufficient documentation

## 2016-07-24 DIAGNOSIS — I728 Aneurysm of other specified arteries: Secondary | ICD-10-CM | POA: Diagnosis not present

## 2016-07-24 DIAGNOSIS — Y831 Surgical operation with implant of artificial internal device as the cause of abnormal reaction of the patient, or of later complication, without mention of misadventure at the time of the procedure: Secondary | ICD-10-CM | POA: Insufficient documentation

## 2016-07-24 DIAGNOSIS — Z8249 Family history of ischemic heart disease and other diseases of the circulatory system: Secondary | ICD-10-CM | POA: Insufficient documentation

## 2016-07-24 DIAGNOSIS — I714 Abdominal aortic aneurysm, without rupture, unspecified: Secondary | ICD-10-CM

## 2016-07-24 DIAGNOSIS — I110 Hypertensive heart disease with heart failure: Secondary | ICD-10-CM | POA: Insufficient documentation

## 2016-07-24 DIAGNOSIS — K219 Gastro-esophageal reflux disease without esophagitis: Secondary | ICD-10-CM | POA: Insufficient documentation

## 2016-07-24 DIAGNOSIS — E785 Hyperlipidemia, unspecified: Secondary | ICD-10-CM | POA: Insufficient documentation

## 2016-07-24 DIAGNOSIS — I2510 Atherosclerotic heart disease of native coronary artery without angina pectoris: Secondary | ICD-10-CM | POA: Diagnosis not present

## 2016-07-24 DIAGNOSIS — Z951 Presence of aortocoronary bypass graft: Secondary | ICD-10-CM | POA: Diagnosis not present

## 2016-07-24 DIAGNOSIS — I509 Heart failure, unspecified: Secondary | ICD-10-CM | POA: Diagnosis not present

## 2016-07-24 DIAGNOSIS — Z955 Presence of coronary angioplasty implant and graft: Secondary | ICD-10-CM | POA: Insufficient documentation

## 2016-07-24 DIAGNOSIS — K449 Diaphragmatic hernia without obstruction or gangrene: Secondary | ICD-10-CM | POA: Diagnosis not present

## 2016-07-24 DIAGNOSIS — Z86718 Personal history of other venous thrombosis and embolism: Secondary | ICD-10-CM | POA: Insufficient documentation

## 2016-07-24 DIAGNOSIS — F419 Anxiety disorder, unspecified: Secondary | ICD-10-CM | POA: Diagnosis not present

## 2016-07-24 DIAGNOSIS — T82330A Leakage of aortic (bifurcation) graft (replacement), initial encounter: Secondary | ICD-10-CM | POA: Diagnosis not present

## 2016-07-24 HISTORY — PX: IR GENERIC HISTORICAL: IMG1180011

## 2016-07-24 LAB — CBC WITH DIFFERENTIAL/PLATELET
Basophils Absolute: 0 10*3/uL (ref 0.0–0.1)
Basophils Relative: 0 %
EOS ABS: 0.2 10*3/uL (ref 0.0–0.7)
EOS PCT: 2 %
HCT: 41.8 % (ref 39.0–52.0)
HEMOGLOBIN: 14 g/dL (ref 13.0–17.0)
LYMPHS ABS: 1.5 10*3/uL (ref 0.7–4.0)
Lymphocytes Relative: 22 %
MCH: 32 pg (ref 26.0–34.0)
MCHC: 33.5 g/dL (ref 30.0–36.0)
MCV: 95.4 fL (ref 78.0–100.0)
Monocytes Absolute: 0.6 10*3/uL (ref 0.1–1.0)
Monocytes Relative: 9 %
NEUTROS PCT: 67 %
Neutro Abs: 4.5 10*3/uL (ref 1.7–7.7)
PLATELETS: 158 10*3/uL (ref 150–400)
RBC: 4.38 MIL/uL (ref 4.22–5.81)
RDW: 14 % (ref 11.5–15.5)
WBC: 6.9 10*3/uL (ref 4.0–10.5)

## 2016-07-24 LAB — BASIC METABOLIC PANEL
Anion gap: 8 (ref 5–15)
BUN: 18 mg/dL (ref 6–20)
CALCIUM: 9.5 mg/dL (ref 8.9–10.3)
CO2: 25 mmol/L (ref 22–32)
CREATININE: 1.07 mg/dL (ref 0.61–1.24)
Chloride: 107 mmol/L (ref 101–111)
Glucose, Bld: 94 mg/dL (ref 65–99)
Potassium: 4.2 mmol/L (ref 3.5–5.1)
SODIUM: 140 mmol/L (ref 135–145)

## 2016-07-24 LAB — PROTIME-INR
INR: 1.15
PROTHROMBIN TIME: 14.7 s (ref 11.4–15.2)

## 2016-07-24 LAB — APTT: aPTT: 36 seconds (ref 24–36)

## 2016-07-24 LAB — POCT ACTIVATED CLOTTING TIME: ACTIVATED CLOTTING TIME: 164 s

## 2016-07-24 MED ORDER — HEPARIN SODIUM (PORCINE) 1000 UNIT/ML IJ SOLN
INTRAMUSCULAR | Status: AC
  Filled 2016-07-24: qty 1

## 2016-07-24 MED ORDER — HEPARIN SODIUM (PORCINE) 1000 UNIT/ML IJ SOLN
2000.0000 [IU] | Freq: Once | INTRAMUSCULAR | Status: AC
  Administered 2016-07-24: 2000 [IU] via INTRAVENOUS

## 2016-07-24 MED ORDER — NITROGLYCERIN 1 MG/10 ML FOR IR/CATH LAB
INTRA_ARTERIAL | Status: AC
  Administered 2016-07-24: 200 ug via INTRA_ARTERIAL
  Filled 2016-07-24: qty 10

## 2016-07-24 MED ORDER — CEFAZOLIN SODIUM-DEXTROSE 2-4 GM/100ML-% IV SOLN
2.0000 g | Freq: Once | INTRAVENOUS | Status: AC
  Administered 2016-07-24: 2 g via INTRAVENOUS

## 2016-07-24 MED ORDER — MIDAZOLAM HCL 2 MG/2ML IJ SOLN
INTRAMUSCULAR | Status: AC | PRN
  Administered 2016-07-24: 1 mg via INTRAVENOUS
  Administered 2016-07-24: 0.5 mg via INTRAVENOUS

## 2016-07-24 MED ORDER — IOPAMIDOL (ISOVUE-300) INJECTION 61%
INTRAVENOUS | Status: AC
  Administered 2016-07-24: 60 mL
  Filled 2016-07-24: qty 150

## 2016-07-24 MED ORDER — FENTANYL CITRATE (PF) 100 MCG/2ML IJ SOLN
INTRAMUSCULAR | Status: AC
  Filled 2016-07-24: qty 2

## 2016-07-24 MED ORDER — CEFAZOLIN SODIUM-DEXTROSE 2-4 GM/100ML-% IV SOLN
INTRAVENOUS | Status: AC
  Filled 2016-07-24: qty 100

## 2016-07-24 MED ORDER — MIDAZOLAM HCL 2 MG/2ML IJ SOLN
INTRAMUSCULAR | Status: AC
  Filled 2016-07-24: qty 2

## 2016-07-24 MED ORDER — SODIUM CHLORIDE 0.9 % IV SOLN
INTRAVENOUS | Status: DC
  Administered 2016-07-24: 08:00:00 via INTRAVENOUS

## 2016-07-24 MED ORDER — LIDOCAINE HCL (PF) 1 % IJ SOLN
INTRAMUSCULAR | Status: AC | PRN
  Administered 2016-07-24: 2 mL

## 2016-07-24 MED ORDER — HYDRALAZINE HCL 20 MG/ML IJ SOLN
INTRAMUSCULAR | Status: AC
  Filled 2016-07-24: qty 1

## 2016-07-24 MED ORDER — LIDOCAINE HCL (PF) 1 % IJ SOLN
INTRAMUSCULAR | Status: AC
  Filled 2016-07-24: qty 30

## 2016-07-24 MED ORDER — NITROGLYCERIN 1 MG/10 ML FOR IR/CATH LAB
100.0000 ug | Freq: Once | INTRA_ARTERIAL | Status: AC
  Administered 2016-07-24: 200 ug via INTRA_ARTERIAL

## 2016-07-24 MED ORDER — FENTANYL CITRATE (PF) 100 MCG/2ML IJ SOLN
INTRAMUSCULAR | Status: AC | PRN
  Administered 2016-07-24 (×2): 25 ug via INTRAVENOUS

## 2016-07-24 MED ORDER — NITROGLYCERIN 1 MG/10 ML FOR IR/CATH LAB
100.0000 ug | Freq: Once | INTRA_ARTERIAL | Status: AC
  Administered 2016-07-24: 100 ug via INTRA_ARTERIAL

## 2016-07-24 MED ORDER — IOPAMIDOL (ISOVUE-300) INJECTION 61%
INTRAVENOUS | Status: AC
  Administered 2016-07-24: 70 mL
  Filled 2016-07-24: qty 100

## 2016-07-24 MED ORDER — HEPARIN SODIUM (PORCINE) 1000 UNIT/ML IJ SOLN
3000.0000 [IU] | Freq: Once | INTRAMUSCULAR | Status: AC
Start: 2016-07-24 — End: 2016-07-24
  Administered 2016-07-24: 3000 [IU] via INTRAVENOUS

## 2016-07-24 NOTE — Sedation Documentation (Signed)
Patient is resting comfortably. 

## 2016-07-24 NOTE — Procedures (Signed)
Interventional Radiology Procedure Note  Procedure: Diagnostic angiogram to evaluate for endoleak.  Attempted coil embolization of IMA stump  Complications: None  Estimated Blood Loss: None  Recommendations: - Bedrest x 4 hrs - DC home - F/U in clinic in 6 mo   Signed,  Criselda Peaches, MD

## 2016-07-24 NOTE — H&P (Signed)
Chief Complaint: Type 2 Endoleak s/p EVAR  Referring Physician:Dr. Ruta Hinds  Supervising Physician: Jacqulynn Cadet  Patient Status: [facility] - Out-pt  HPI: Jonathan Arroyo is an 83 y.o. male with a history of an EVAR in October 2015.  He was noted to have a Type 2 endoleak at the end of the case secondary to proximal lumbar accessory vessels.  This has been followed by Dr. Oneida Alar. It has recently increased in size and he has been referred to Dr. Laurence Ferrari for consideration of embolization.  Please see Dr. Katrinka Blazing note for further details.  Past Medical History:  Past Medical History:  Diagnosis Date  . Abdominal aneurysm without mention of rupture   . Acute venous embolism and thrombosis of unspecified deep vessels of lower extremity   . Anxiety   . CAD (coronary artery disease)    a. s/p CABG 1994, redo 2005. b. [place] 03/2013: 2/3 patent bpg. s/p PTCA/DES to distal LM and PTCA/DES to distal LCx.  . CHF (congestive heart failure) ([place])   . Congestive heart failure (CHF) ([place])   . Esophageal reflux   . Hiatal hernia    HISTORY  . HTN (hypertension)   . Hyperlipidemia   . Ischemic cardiomyopathy    a. EF history: 2011: 35-40%, 11/2012: 45-50%. b. 03/2013: 35% by cath.  . Myocardial infarction   . Other specified gastritis without mention of hemorrhage   . Renal calculus    history  . Spinal stenosis, unspecified region other than cervical   . Stomach pain June 2013   Hospital stay at [facility]    Past Surgical History:  Past Surgical History:  Procedure Laterality Date  . ABDOMINAL AORTIC ANEURYSM REPAIR    . ABDOMINAL AORTIC ENDOVASCULAR STENT GRAFT N/A 03/14/2014   Procedure: ABDOMINAL AORTIC ENDOVASCULAR STENT GRAFT;  Surgeon: Elam Dutch, MD;  Location: [place];  Service: Vascular;  Laterality: N/A;  . BACK SURGERY    . CORONARY ANGIOPLASTY WITH STENT PLACEMENT  04/10/2013   DES LEFT MAIN DES LEFT CIRCUMFLEX    DR [place]  . CORONARY ARTERY BYPASS GRAFT   1994 and  2005  . HIATAL HERNIA REPAIR    . IR GENERIC HISTORICAL  07/01/2016   IR RADIOLOGIST EVAL & MGMT 07/01/2016 Jacqulynn Cadet, MD [facility] INTERV RAD  . JOINT REPLACEMENT  2005   Right knee  . LEFT HEART CATHETERIZATION WITH CORONARY ANGIOGRAM N/A 04/10/2013   Procedure: LEFT HEART CATHETERIZATION WITH CORONARY ANGIOGRAM;  Surgeon: Burnell Blanks, MD;  Location: [facility] CATH LAB;  Service: Cardiovascular;  Laterality: N/A;  . PTCA    . ROTATOR CUFF REPAIR Left   . SPINE SURGERY     X's 3  . TOTAL KNEE ARTHROPLASTY     right    Family History:  Family History  Problem Relation Age of Onset  . Coronary artery disease Mother   . Diabetes Mother   . Heart disease Mother   . Hyperlipidemia Mother   . Hypertension Mother   . Heart attack Mother   . Heart disease Father   . Hyperlipidemia Father   . Hypertension Father   . Heart attack Father   . Stroke Neg Hx     Social History:  reports that he has never smoked. He has never used smokeless tobacco. He reports that he does not drink alcohol or use drugs.  Allergies:  Allergies  Allergen Reactions  . Alum & Mag Hydroxide-Simeth Other (See Comments)    Unknown ([place])  .  Oxycodone-Acetaminophen Other (See Comments)    unknown    Medications: Medications reviewed in epic  Please HPI for pertinent positives, otherwise complete 10 system ROS negative.  Mallampati Score: MD Evaluation Airway: WNL Heart: WNL Abdomen: WNL Chest/ Lungs: WNL ASA  Classification: 3 Mallampati/Airway Score: One  Physical Exam: BP (!) 141/78   Pulse (!) 55   Temp 98.5 F (36.9 C)   Resp 18   Ht '6\' 2"'  (1.88 m)   Wt 193 lb (87.5 kg)   SpO2 98%   BMI 24.78 kg/m  Body mass index is 24.78 kg/m. General: pleasant, WD, WN, obese white male who is laying in bed in NAD HEENT: head is normocephalic, atraumatic.  Sclera are noninjected.  PERRL.  Ears and nose without any masses or lesions.  Mouth is pink and moist Heart: regular  with some ectopic beats.  Normal s1,s2. No obvious murmurs, gallops, or rubs noted.  Palpable radial and pedal pulses bilaterally Lungs: CTAB, no wheezes, rhonchi, or rales noted.  Respiratory effort nonlabored Abd: soft, NT, ND, +BS, no masses, hernias, or organomegaly Psych: A&Ox3 with an appropriate affect.   Labs: Results for orders placed or performed during the hospital encounter of 07/24/16 (from the past 48 hour(s))  APTT     Status: None   Collection Time: 07/24/16  7:11 AM  Result Value Ref Range   aPTT 36 24 - 36 seconds  Basic metabolic panel     Status: None   Collection Time: 07/24/16  7:11 AM  Result Value Ref Range   Sodium 140 135 - 145 mmol/L   Potassium 4.2 3.5 - 5.1 mmol/L   Chloride 107 101 - 111 mmol/L   CO2 25 22 - 32 mmol/L   Glucose, Bld 94 65 - 99 mg/dL   BUN 18 6 - 20 mg/dL   Creatinine, Ser 1.07 0.61 - 1.24 mg/dL   Calcium 9.5 8.9 - 10.3 mg/dL   GFR calc non Af Amer >60 >60 mL/min   GFR calc Af Amer >60 >60 mL/min    Comment: (NOTE) The eGFR has been calculated using the CKD EPI equation. This calculation has not been validated in all clinical situations. eGFR's persistently <60 mL/min signify possible Chronic Kidney Disease.    Anion gap 8 5 - 15  CBC with Differential/Platelet     Status: None   Collection Time: 07/24/16  7:11 AM  Result Value Ref Range   WBC 6.9 4.0 - 10.5 K/uL   RBC 4.38 4.22 - 5.81 MIL/uL   Hemoglobin 14.0 13.0 - 17.0 g/dL   HCT 41.8 39.0 - 52.0 %   MCV 95.4 78.0 - 100.0 fL   MCH 32.0 26.0 - 34.0 pg   MCHC 33.5 30.0 - 36.0 g/dL   RDW 14.0 11.5 - 15.5 %   Platelets 158 150 - 400 K/uL   Neutrophils Relative % 67 %   Neutro Abs 4.5 1.7 - 7.7 K/uL   Lymphocytes Relative 22 %   Lymphs Abs 1.5 0.7 - 4.0 K/uL   Monocytes Relative 9 %   Monocytes Absolute 0.6 0.1 - 1.0 K/uL   Eosinophils Relative 2 %   Eosinophils Absolute 0.2 0.0 - 0.7 K/uL   Basophils Relative 0 %   Basophils Absolute 0.0 0.0 - 0.1 K/uL  Protime-INR      Status: None   Collection Time: 07/24/16  7:11 AM  Result Value Ref Range   Prothrombin Time 14.7 11.4 - 15.2 seconds   INR 1.15  Imaging: No results found.  Assessment/Plan 1. Type 2 endoleak s/p EVAR in 2015 -we will plan to start with a mesenteric angiogram approach to see if we can access the contributing vessels this way.  If we are unable to do that today, then we will just proceed with a diagnostic angiogram and plan to return for a translumbar approach at a later date. -labs and vitals reviewed -xarelto was last taken 2 days ago -Risks and Benefits discussed with the patient including, but not limited to bleeding, infection, vascular injury or contrast induced renal failure. All of the patient's questions were answered, patient is agreeable to proceed. Consent signed and in chart.  Thank you for this interesting consult.  I greatly enjoyed meeting KYSER WANDEL and look forward to participating in their care.  A copy of this report was sent to the requesting provider on this date.  Electronically Signed: Henreitta Cea 07/24/2016, 9:19 AM   I spent a total of    25 Minutes in face to face in clinical consultation, greater than 50% of which was counseling/coordinating care for type 2 endoleak

## 2016-07-24 NOTE — Discharge Instructions (Signed)
Femoral Site Care °Refer to this sheet in the next few weeks. These instructions provide you with information about caring for yourself after your procedure. Your health care provider may also give you more specific instructions. Your treatment has been planned according to current medical practices, but problems sometimes occur. Call your health care provider if you have any problems or questions after your procedure. °What can I expect after the procedure? °After your procedure, it is typical to have the following: °· Bruising at the site that usually fades within 1-2 weeks. °· Blood collecting in the tissue (hematoma) that may be painful to the touch. It should usually decrease in size and tenderness within 1-2 weeks. °Follow these instructions at home: °· Take medicines only as directed by your health care provider. °· You may shower 24-48 hours after the procedure or as directed by your health care provider. Remove the bandage (dressing) and gently wash the site with plain soap and water. Pat the area dry with a clean towel. Do not rub the site, because this may cause bleeding. °· Do not take baths, swim, or use a hot tub until your health care provider approves. °· Check your insertion site every day for redness, swelling, or drainage. °· Do not apply powder or lotion to the site. °· Limit use of stairs to twice a day for the first 2-3 days or as directed by your health care provider. °· Do not squat for the first 2-3 days or as directed by your health care provider. °· Do not lift over 10 lb (4.5 kg) for 5 days after your procedure or as directed by your health care provider. °· Ask your health care provider when it is okay to: °¨ Return to work or school. °¨ Resume usual physical activities or sports. °¨ Resume sexual activity. °· Do not drive home if you are discharged the same day as the procedure. Have someone else drive you. °· You may drive 24 hours after the procedure unless otherwise instructed by  your health care provider. °· Do not operate machinery or power tools for 24 hours after the procedure or as directed by your health care provider. °· If your procedure was done as an outpatient procedure, which means that you went home the same day as your procedure, a responsible adult should be with you for the first 24 hours after you arrive home. °· Keep all follow-up visits as directed by your health care provider. This is important. °Contact a health care provider if: °· You have a fever. °· You have chills. °· You have increased bleeding from the site. Hold pressure on the site. °Get help right away if: °· You have unusual pain at the site. °· You have redness, warmth, or swelling at the site. °· You have drainage (other than a small amount of blood on the dressing) from the site. °· The site is bleeding, and the bleeding does not stop after 30 minutes of holding steady pressure on the site. °· Your leg or foot becomes pale, cool, tingly, or numb. °This information is not intended to replace advice given to you by your health care provider. Make sure you discuss any questions you have with your health care provider. °Document Released: 01/05/2014 Document Revised: 10/10/2015 Document Reviewed: 11/21/2013 °Elsevier Interactive Patient Education © 2017 [employer] ° °

## 2016-08-03 ENCOUNTER — Other Ambulatory Visit (HOSPITAL_COMMUNITY): Payer: Self-pay | Admitting: Interventional Radiology

## 2016-08-03 DIAGNOSIS — IMO0002 Reserved for concepts with insufficient information to code with codable children: Secondary | ICD-10-CM

## 2016-08-03 DIAGNOSIS — T82330A Leakage of aortic (bifurcation) graft (replacement), initial encounter: Secondary | ICD-10-CM

## 2016-08-27 ENCOUNTER — Ambulatory Visit
Admission: RE | Admit: 2016-08-27 | Discharge: 2016-08-27 | Disposition: A | Discharge status: Home / Self Care | Payer: Medicare Other | Source: Ambulatory Visit | Attending: Interventional Radiology | Admitting: Interventional Radiology

## 2016-08-27 DIAGNOSIS — T82330A Leakage of aortic (bifurcation) graft (replacement), initial encounter: Secondary | ICD-10-CM

## 2016-08-27 DIAGNOSIS — IMO0002 Reserved for concepts with insufficient information to code with codable children: Secondary | ICD-10-CM

## 2016-08-27 HISTORY — PX: IR RADIOLOGIST EVAL & MGMT: IMG5224

## 2016-08-27 NOTE — Progress Notes (Signed)
Chief Complaint: F/U after Arterogram for Endoleak  Referring Physician(s): Dr. Ruta Hinds  Supervising Physician: Jacqulynn Cadet  History of Present Illness: Jonathan Arroyo is a 83 y.o. male who is here today for post op visit after Arteriogram to repair a Type 2 Endoleak.  Extensive selective visceral and right internal iliac arteriography demonstrated no evidence of active type 2 endoleak.  The origin of the inferior mesenteric artery was patent and contrast could be refluxed into the excluded aneurysm sac, but remained static. There was no evidence of arterialized flow within the aneurysm sac arising from the IMA.  Unsuccessful attempted coil embolization of the origin of the inferior mesenteric artery (attempted as a prophylactic measure to prohibit recurrence of the prior endoleak). Successful retrieval of the deployed micro coil  He is doing very well.  He only c/o of the usual aches and pains. He does have some pain in his legs and requires a cane for assistance but he states this "has been going on for years"  He denies any abdominal of back pain.  Past Medical History:  Diagnosis Date  . Abdominal aneurysm without mention of rupture   . Acute venous embolism and thrombosis of unspecified deep vessels of lower extremity   . Anxiety   . CAD (coronary artery disease)    a. s/p CABG 1994, redo 2005. b. [place] 03/2013: 2/3 patent bpg. s/p PTCA/DES to distal LM and PTCA/DES to distal LCx.  . CHF (congestive heart failure) ([place])   . Congestive heart failure (CHF) ([place])   . Esophageal reflux   . Hiatal hernia    HISTORY  . HTN (hypertension)   . Hyperlipidemia   . Ischemic cardiomyopathy    a. EF history: 2011: 35-40%, 11/2012: 45-50%. b. 03/2013: 35% by cath.  . Myocardial infarction   . Other specified gastritis without mention of hemorrhage   . Renal calculus    history  . Spinal stenosis, unspecified region other than cervical   . Stomach pain June 2013    Hospital stay at [facility]    Past Surgical History:  Procedure Laterality Date  . ABDOMINAL AORTIC ANEURYSM REPAIR    . ABDOMINAL AORTIC ENDOVASCULAR STENT GRAFT N/A 03/14/2014   Procedure: ABDOMINAL AORTIC ENDOVASCULAR STENT GRAFT;  Surgeon: Elam Dutch, MD;  Location: [place];  Service: Vascular;  Laterality: N/A;  . BACK SURGERY    . CORONARY ANGIOPLASTY WITH STENT PLACEMENT  04/10/2013   DES LEFT MAIN DES LEFT CIRCUMFLEX    DR Pink  . CORONARY ARTERY BYPASS GRAFT  1994 and  2005  . HIATAL HERNIA REPAIR    . IR GENERIC HISTORICAL  07/01/2016   IR RADIOLOGIST EVAL & MGMT 07/01/2016 Jacqulynn Cadet, MD [facility] INTER[facility] RAD  . IR GENERIC HISTORICAL  07/24/2016   IR ANGIOGRAM SELECTIVE EACH ADDITIONAL VESSEL 07/24/2016 Jacqulynn Cadet, MD [facility]  . IR GENERIC HISTORICAL  07/24/2016   IR ANGIOGRAM SELECTIVE EACH ADDITIONAL VESSEL 07/24/2016 Jacqulynn Cadet, MD [facility]  . IR GENERIC HISTORICAL  07/24/2016   IR ANGIOGRAM VISCERAL SELECTIVE 07/24/2016 Jacqulynn Cadet, MD [facility]  . IR GENERIC HISTORICAL  07/24/2016   IR US GUIDE VASC ACCESS RIGHT 07/24/2016 Jacqulynn Cadet, MD [facility]  . IR GENERIC HISTORICAL  07/24/2016   IR ANGIOGRAM SELECTIVE EACH ADDITIONAL VESSEL 07/24/2016 Jacqulynn Cadet, MD [facility]  . IR GENERIC HISTORICAL  07/24/2016   IR ANGIOGRAM PELVIS SELECTIVE OR SUPRASELECTIVE 07/24/2016 Jacqulynn Cadet, MD [facility]  . IR GENERIC HISTORICAL  07/24/2016   IR ANGIOGRAM SELECTIVE EACH ADDITIONAL VESSEL 07/24/2016 Jacqulynn Cadet, MD [facility]  . IR GENERIC HISTORICAL  07/24/2016   IR EMBO ARTERIAL NOT HEMORR HEMANG INC GUIDE ROADMAPPING 07/24/2016 Jacqulynn Cadet, MD [facility]  . JOINT REPLACEMENT  2005   Right knee  . LEFT HEART CATHETERIZATION WITH CORONARY ANGIOGRAM N/A 04/10/2013   Procedure: LEFT HEART CATHETERIZATION WITH CORONARY ANGIOGRAM;  Surgeon: Burnell Blanks, MD;  Location: [facility] CATH LAB;  Service: Cardiovascular;  Laterality: N/A;    . PTCA    . ROTATOR CUFF REPAIR Left   . SPINE SURGERY     X's 3  . TOTAL KNEE ARTHROPLASTY     right    Allergies: Alum & mag hydroxide-simeth and Oxycodone-acetaminophen  Medications: Prior to Admission medications   Medication Sig Start Date End Date Taking? Authorizing Provider  aspirin 81 MG tablet Take 81 mg by mouth daily.    Historical Provider, MD  busPIRone (BUSPAR) 10 MG tablet Take 10 mg by mouth 2 (two) times daily as needed (anxiety).     Historical Provider, MD  carvedilol (COREG) 6.25 MG tablet Take 3.125 mg by mouth 2 (two) times daily with a meal.     Historical Provider, MD  finasteride (PROSCAR) 5 MG tablet Take 5 mg by mouth daily.      Historical Provider, MD  furosemide (LASIX) 20 MG tablet Take 1 tablet (20 mg total) by mouth as needed for edema. 02/12/15   Imogene Burn, PA-C  HYDROcodone-acetaminophen (NORCO/VICODIN) 5-325 MG per tablet Take 1 tablet by mouth every 6 (six) hours as needed for moderate pain. Patient taking differently: Take 0.5-1 tablets by mouth at bedtime as needed for moderate pain (Takes only at bedtime if needed - starts with half tablet, and will take other half if needed).  03/15/14   Alvia Grove, PA-C  lisinopril (PRINIVIL,ZESTRIL) 40 MG tablet Take 20 mg by mouth 2 (two) times daily.  12/24/10   Hillary Bow, MD  meclizine (ANTIVERT) 12.5 MG tablet Take 12.5 mg by mouth 3 (three) times daily as needed for dizziness.    Historical Provider, MD  nitroGLYCERIN (NITROSTAT) 0.4 MG SL tablet Place 0.4 mg under the tongue every 5 (five) minutes as needed (MAX 3 TABLETS). For chest pain    Historical Provider, MD  pantoprazole (PROTONIX) 40 MG tablet Take 40 mg by mouth every evening.     Historical Provider, MD  polyethylene glycol (MIRALAX / GLYCOLAX) packet Take 17 g by mouth daily as needed for mild constipation.    Historical Provider, MD  rivaroxaban (XARELTO) 20 MG TABS tablet Take 20 mg by mouth daily. 04/16/16   Historical  Provider, MD  simvastatin (ZOCOR) 80 MG tablet Take 40 mg by mouth at bedtime.     Historical Provider, MD  zolpidem (AMBIEN) 10 MG tablet Take 10 mg by mouth at bedtime. For insomnia    Historical Provider, MD     Family History  Problem Relation Age of Onset  . Coronary artery disease Mother   . Diabetes Mother   . Heart disease Mother   . Hyperlipidemia Mother   . Hypertension Mother   . Heart attack Mother   . Heart disease Father   . Hyperlipidemia Father   . Hypertension Father   . Heart attack Father   . Stroke Neg Hx     Social History   Social History  . Marital status: Married    Spouse name: N/A  .  Number of children: N/A  . Years of education: N/A   Social History Main Topics  . Smoking status: Never Smoker  . Smokeless tobacco: Never Used  . Alcohol use No  . Drug use: No  . Sexual activity: Not on file   Other Topics Concern  . Not on file   Social History Narrative  . No narrative on file    Review of Systems: A 12 point ROS discussed  Review of Systems  Constitutional: Negative.   HENT: Negative.   Respiratory: Negative.   Cardiovascular: Negative.   Gastrointestinal: Negative.   Genitourinary: Negative.   Musculoskeletal: Positive for arthralgias and gait problem.  Skin: Negative.   Hematological: Negative.   Psychiatric/Behavioral: Negative.     Vital Signs: BP (!) 153/84 (BP Location: Left Arm, Patient Position: Sitting, Cuff Size: Normal)   Pulse 82   Temp 98.2 F (36.8 C) (Oral)   Resp 14   Ht 6\' 2"  (1.88 m)   Wt 194 lb (88 kg)   SpO2 97%   BMI 24.91 kg/m   Physical Exam  Constitutional: He is oriented to person, place, and time. He appears well-developed and well-nourished.  HENT:  Head: Normocephalic and atraumatic.  Eyes: EOM are normal.  Neck: Normal range of motion.  Cardiovascular: Intact distal pulses.   Irregular rate and rhythm c/w Afib (takes Xarelto) DPA pulse on right a little weaker than left.    Pulmonary/Chest: Effort normal and breath sounds normal. No respiratory distress. He has no wheezes.  Abdominal: Soft. He exhibits no distension. There is no tenderness.  Musculoskeletal:  Walks with a cane  Neurological: He is alert and oriented to person, place, and time.  Skin:  Dry flaky skin on both lower extremities.  Psychiatric: He has a normal mood and affect. His behavior is normal. Judgment and thought content normal.  Vitals reviewed.   Imaging: INDICATION: 83 year old male with a history of abdominal aortic aneurysm which is enlarged slowly over time. Patient previously had a type 2 endoleak thought to arise from the inferior mesenteric artery. On the most recent CT arteriogram no definite endoleak was identified. He presents today for diagnostic angiography. Given endoleak is identified, endovascular repair will be performed.  EXAM: SELECTIVE VISCERAL ARTERIOGRAPHY; ADDITIONAL ARTERIOGRAPHY; IR ULTRASOUND GUIDANCE VASC ACCESS RIGHT; PELVIC SELECTIVE ARTERIOGRAPHY; IR EMBO ARTERIAL NOT [place]  Interventional Radiologist:  Criselda Peaches, MD  MEDICATIONS: 5000 units heparin administered intravenously; 500 mcg nitroglycerin administered intra-arterially  ANESTHESIA/SEDATION: Fentanyl 50 mcg IV; Versed 1.5 mg IV  Moderate Sedation Time:  120 minutes  The patient was continuously monitored during the procedure by the interventional radiology nurse under my direct supervision.  CONTRAST:  130 mL Isovue-300  FLUOROSCOPY TIME:  Fluoroscopy Time: 22 minutes 0 seconds (824 mGy).  COMPLICATIONS: None immediate.  PROCEDURE: Informed consent was obtained from the patient following explanation of the procedure, risks, benefits and alternatives. The patient understands, agrees and consents for the procedure. All questions were addressed. A time out was performed.  Maximal barrier sterile technique utilized including  caps, mask, sterile gowns, sterile gloves, large sterile drape, hand hygiene, and chlorhexidine skin prep.  The right common femoral artery was interrogated with ultrasound and found to be widely patent. An image was obtained and stored for the medical record. Local anesthesia was attained by infiltration with 1% lidocaine. A small dermatotomy was made. Under real-time sonographic guidance, the vessel was punctured with a 21 gauge micropuncture needle. Using standard technique, the  initial micro needle was exchanged over a 0.018 micro wire for a transitional 4 [place] micro sheath. The micro sheath was then exchanged over a 0.035 wire for a working 5 [place] vascular sheath.  A pigtail catheter was advanced over a Bentson wire and positioned in the juxtarenal aorta. An aortogram was performed. The celiac artery is widely patent. Unremarkable right renal artery. Bulky atherosclerotic plaque results in a high-grade stenosis of the proximal left renal artery.  Superior mesenteric artery is patent. Both internal iliac arteries are widely patent. The ileo lumbar trunks are patent. There is opacification of several lumbar arteries, but no opacification of the excluded aneurysm sac. A collateral pathway from superior mesenteric artery is visualized to the inferior mesenteric artery which opacifies on the delayed phase imaging. Again, there is no filling of the aneurysm sac.  The pigtail catheter was then removed over a wire. A C2 cobra catheter was advanced over the wire in used to select the superior mesenteric artery. A superior mesenteric arteriogram was performed. There is a collateral pathway via the middle colic artery to the [place] to the left colic artery which then joins the inferior mesenteric artery. No definite retrograde flow from the inferior mesenteric artery into the aneurysm sac. It is difficult to tell if there is outflow coming from the aneurysm sac into the  inferior mesenteric artery.  Therefore, the decision was made to navigate into the inferior mesenteric artery. The C2 cobra catheter was exchanged over a wire for a 5 French glide catheter which was advanced of the superior mesenteric artery and then, into the middle colic artery. A Renegade high flow microcatheter was then advanced into the middle colloid artery. An arteriogram was performed. No definite filling of the aneurysm sac. The microcatheter was next advanced into the [place] and an arteriogram was performed. No filling of the aneurysm sac.  The microcatheter was further advanced into the left colic artery and an arteriogram was performed. The junction with the inferior mesenteric artery was identified. The microcatheter was then advanced into the inferior mesenteric artery nearly to its origin from the excluded aneurysm sac. A hand injection of contrast material was performed. Contrast does pass through the origin of the artery and into the excluded aneurysm sac. However, the contrast material does not fill a flow channel but rather stains a thrombosed aneurysm sac. The contrast is static. There is no evidence of flow within the aneurysm sac. This segment of the inferior mesenteric artery is very small measuring only 1- 2 mm.  Because the patient has had enlargement of the aneurysm sac over time and has a prior history of a type 2 endoleak arising from the inferior mesenteric artery, and the fact that the origin remains patent the decision was made to attempt coil embolization of the inferior mesenteric artery in an effort to prevent recurrence of the patient's type 2 endoleak. A 3 x 6 mm soft interlock detachable coil was selected in advanced through the microcatheter. Unfortunately, the interlock coil became detached approximately halfway through coil placement. Attempts were made to salvage the coil, but this was not possible. The coil was deployed in the left  colic artery. At this time, 5000 units of heparin was administered intravenously.  Unfortunately, we are currently out of stock in are normal 2 and 4 mm micro snares which I would typically used to retrieve this maldeployed coil. I gave consideration to leaving the coil in place, however the risk for colonic ischemia is  unacceptable. My only option with the equipment on hand was to use a solitaire stent retriever device to retrieve this micro coil. Therefore, an SV 8 exchange length micro wire was advanced to the microcatheter in the microcatheter and 5 French catheter system removed. A 5 French neuro aspiration catheter was then combined with a Renegade ST microcatheter and advanced over the wire as a unit. The aspiration catheter was left just proximal to the coil while the microcatheter was advanced into the IMA distal to the coil. The solitaire device was then deployed and broad carefully back into the aspiration sheath. In a single pass, the device captured coil and it was safely removed from the body.  Intra-arterial nitroglycerin was administered and an arteriogram performed. There is some spasm in the branch of the left colic artery, however the artery is patent. There is no evidence of thrombus.  Consideration was made for re-advancing the microcatheter into the IMA to attempt coil embolization with a different device. However, due to the extreme tortuosity of the vascular system and the fact that 1 coil has already maldeployed combined with the fact that this is a prophylactic embolization to prevent future endoleak recurrence and there is not currently evidence of active endoleak it was felt that the risk of attempting embolization again exceeded the potential benefit. Therefore, further embolization was aborted. The microcatheter and 5 French catheter were removed.  A 5 French rim catheter was then advanced over a Bentson wire in used to select the right internal  iliac artery. An internal iliac arteriogram was performed. There is excellent visualization of the ileal lumbar artery and multiple right-sided lumbar arteries. Again, there is no inflow into the aneurysm sac. At this point I was satisfied that there is no current active endoleak. The 5 French catheter was removed. A limited right common femoral arteriogram was performed confirming common femoral access and hemostasis was attained with application of Cordis Exoseal device after the ACT was found to be less than 180.  IMPRESSION: 1. Extensive selective visceral and right internal iliac arteriography demonstrates no evidence of active type 2 endoleak. 2. The origin of the inferior mesenteric artery is patent and contrast can be refluxed into the excluded aneurysm sac, but it remains static. There is no evidence of arterialized flow within the aneurysm sac arising from the IMA. 3. Unsuccessful attempted coil embolization of the origin of the inferior mesenteric artery (attempted as a prophylactic measure to prohibit recurrence of the prior endoleak). 4. Successful retrieval of mild deployed micro coil.  PLAN: 1. Follow-up in clinic in 2-4 weeks. 2. Next CTA of the abdomen and pelvis in 6 months.  Signed,  Criselda Peaches, MD  [facility]  [facility]   Electronically Signed   By: Jacqulynn Cadet M.D.   On: 07/24/2016 17:24   Labs:  CBC:  Recent Labs  07/24/16 0711  WBC 6.9  HGB 14.0  HCT 41.8  PLT 158    COAGS:  Recent Labs  07/24/16 0711  INR 1.15  APTT 36    BMP:  Recent Labs  07/24/16 0711  NA 140  K 4.2  CL 107  CO2 25  GLUCOSE 94  BUN 18  CALCIUM 9.5  CREATININE 1.07  GFRNONAA >60  GFRAA >60    LIVER FUNCTION TESTS: No results for input(s): BILITOT, AST, ALT, ALKPHOS, PROT, ALBUMIN in the last 8760 hours.  TUMOR MARKERS: No results for input(s): AFPTM, CEA, CA199,  CHROMGRNA in the last  8760 hours.  Assessment:  S/P Endovascular repair of Aortic Aneurysm by Dr.Fields in October 2015.  No endoleak seen on arteriogram.  Doing well post procedure  Return in 6 months with CTA.   Electronically Signed: Murrell Redden PA-C 08/27/2016, 11:23 AM   Please refer to Dr. Katrinka Blazing attestation of this note for management and plan.

## 2016-10-30 ENCOUNTER — Encounter: Payer: Self-pay | Admitting: Interventional Radiology

## 2017-01-13 ENCOUNTER — Other Ambulatory Visit: Payer: Self-pay | Admitting: Radiology

## 2017-01-13 ENCOUNTER — Other Ambulatory Visit (HOSPITAL_COMMUNITY): Payer: Self-pay | Admitting: Interventional Radiology

## 2017-01-13 ENCOUNTER — Encounter: Payer: Self-pay | Admitting: Radiology

## 2017-01-13 DIAGNOSIS — IMO0001 Reserved for inherently not codable concepts without codable children: Secondary | ICD-10-CM

## 2017-01-13 DIAGNOSIS — T82330D Leakage of aortic (bifurcation) graft (replacement), subsequent encounter: Secondary | ICD-10-CM

## 2017-01-27 ENCOUNTER — Ambulatory Visit (HOSPITAL_COMMUNITY): Admission: RE | Admit: 2017-01-27 | Payer: Medicare Other | Source: Ambulatory Visit

## 2017-01-27 ENCOUNTER — Other Ambulatory Visit: Payer: Medicare Other

## 2017-02-09 ENCOUNTER — Ambulatory Visit (HOSPITAL_COMMUNITY)
Admission: RE | Admit: 2017-02-09 | Discharge: 2017-02-09 | Disposition: A | Discharge status: Home / Self Care | Payer: Medicare Other | Source: Ambulatory Visit | Attending: Interventional Radiology | Admitting: Interventional Radiology

## 2017-02-09 ENCOUNTER — Encounter (HOSPITAL_COMMUNITY): Payer: Self-pay

## 2017-02-09 ENCOUNTER — Ambulatory Visit
Admission: RE | Admit: 2017-02-09 | Discharge: 2017-02-09 | Disposition: A | Discharge status: Home / Self Care | Payer: Medicare Other | Source: Ambulatory Visit | Attending: Interventional Radiology | Admitting: Interventional Radiology

## 2017-02-09 DIAGNOSIS — X58XXXD Exposure to other specified factors, subsequent encounter: Secondary | ICD-10-CM | POA: Diagnosis not present

## 2017-02-09 DIAGNOSIS — I714 Abdominal aortic aneurysm, without rupture: Secondary | ICD-10-CM | POA: Diagnosis not present

## 2017-02-09 DIAGNOSIS — I701 Atherosclerosis of renal artery: Secondary | ICD-10-CM | POA: Diagnosis not present

## 2017-02-09 DIAGNOSIS — T82330D Leakage of aortic (bifurcation) graft (replacement), subsequent encounter: Secondary | ICD-10-CM | POA: Diagnosis present

## 2017-02-09 DIAGNOSIS — K5730 Diverticulosis of large intestine without perforation or abscess without bleeding: Secondary | ICD-10-CM | POA: Insufficient documentation

## 2017-02-09 DIAGNOSIS — N400 Enlarged prostate without lower urinary tract symptoms: Secondary | ICD-10-CM | POA: Insufficient documentation

## 2017-02-09 DIAGNOSIS — IMO0001 Reserved for inherently not codable concepts without codable children: Secondary | ICD-10-CM

## 2017-02-09 HISTORY — PX: IR RADIOLOGIST EVAL & MGMT: IMG5224

## 2017-02-09 LAB — POCT I-STAT CREATININE: Creatinine, Ser: 1 mg/dL (ref 0.61–1.24)

## 2017-02-09 MED ORDER — IOPAMIDOL (ISOVUE-370) INJECTION 76%
100.0000 mL | Freq: Once | INTRAVENOUS | Status: AC | PRN
  Administered 2017-02-09: 100 mL via INTRAVENOUS

## 2017-02-09 MED ORDER — IOPAMIDOL (ISOVUE-370) INJECTION 76%
INTRAVENOUS | Status: AC
  Filled 2017-02-09: qty 100

## 2017-02-09 NOTE — Progress Notes (Signed)
Referring Physician(s):  Dr. Ruta Hinds  Chief Complaint: The patient is seen in follow up today s/p Type II endoleak repair 07/01/16  History of present illness: Jonathan Arroyo is a 83 year old male with past medical history of CAD s/p CABG, CHF, HLD, and abdominal aneurysm s/p EVAR October 2015 who developed a 43mm type II endoleak now s/p angiogram and attempted endovascular repair 07/24/16.   Patient presents to radiology today in his usual state of health.  He does complain of occasional abdominal pain and shortness of breath which has been present for several months. He has follow-up with Dr. Bea Graff for his history of CHF.  Patient denies complaints related to endoleak or repair.  He remains asymptomatic of persistent abdominal or back pain.    Past Medical History:  Diagnosis Date  . Abdominal aneurysm without mention of rupture   . Acute venous embolism and thrombosis of unspecified deep vessels of lower extremity   . Anxiety   . CAD (coronary artery disease)    a. s/p CABG 1994, redo 2005. b. [place] 03/2013: 2/3 patent bpg. s/p PTCA/DES to distal LM and PTCA/DES to distal LCx.  . CHF (congestive heart failure) ([place])   . Congestive heart failure (CHF) ([place])   . Esophageal reflux   . Hiatal hernia    HISTORY  . HTN (hypertension)   . Hyperlipidemia   . Ischemic cardiomyopathy    a. EF history: 2011: 35-40%, 11/2012: 45-50%. b. 03/2013: 35% by cath.  . Myocardial infarction ([place])   . Other specified gastritis without mention of hemorrhage   . Renal calculus    history  . Spinal stenosis, unspecified region other than cervical   . Stomach pain June 2013   Hospital stay at [facility]    Past Surgical History:  Procedure Laterality Date  . ABDOMINAL AORTIC ANEURYSM REPAIR    . ABDOMINAL AORTIC ENDOVASCULAR STENT GRAFT N/A 03/14/2014   Procedure: ABDOMINAL AORTIC ENDOVASCULAR STENT GRAFT;  Surgeon: Elam Dutch, MD;  Location: [place];  Service: Vascular;  Laterality: N/A;  .  BACK SURGERY    . CORONARY ANGIOPLASTY WITH STENT PLACEMENT  04/10/2013   DES LEFT MAIN DES LEFT CIRCUMFLEX    DR [place]  . CORONARY ARTERY BYPASS GRAFT  1994 and  2005  . HIATAL HERNIA REPAIR    . IR GENERIC HISTORICAL  07/01/2016   IR RADIOLOGIST EVAL & MGMT 07/01/2016 Jacqulynn Cadet, MD [facility] INTER[facility] RAD  . IR GENERIC HISTORICAL  07/24/2016   IR ANGIOGRAM SELECTIVE EACH ADDITIONAL VESSEL 07/24/2016 Jacqulynn Cadet, MD [facility]  . IR GENERIC HISTORICAL  07/24/2016   IR ANGIOGRAM SELECTIVE EACH ADDITIONAL VESSEL 07/24/2016 Jacqulynn Cadet, MD [facility]  . IR GENERIC HISTORICAL  07/24/2016   IR ANGIOGRAM VISCERAL SELECTIVE 07/24/2016 Jacqulynn Cadet, MD [facility]  . IR GENERIC HISTORICAL  07/24/2016   IR US GUIDE VASC ACCESS RIGHT 07/24/2016 Jacqulynn Cadet, MD [facility]  . IR GENERIC HISTORICAL  07/24/2016   IR ANGIOGRAM SELECTIVE EACH ADDITIONAL VESSEL 07/24/2016 Jacqulynn Cadet, MD [facility]  . IR GENERIC HISTORICAL  07/24/2016   IR ANGIOGRAM PELVIS SELECTIVE OR SUPRASELECTIVE 07/24/2016 Jacqulynn Cadet, MD [facility]  . IR GENERIC HISTORICAL  07/24/2016   IR ANGIOGRAM SELECTIVE EACH ADDITIONAL VESSEL 07/24/2016 Jacqulynn Cadet, MD [facility]  . IR GENERIC HISTORICAL  07/24/2016   IR EMBO ARTERIAL NOT HEMORR HEMANG INC GUIDE ROADMAPPING 07/24/2016 Jacqulynn Cadet, MD [facility]  . IR RADIOLOGIST EVAL & MGMT  08/27/2016  .  JOINT REPLACEMENT  2005   Right knee  . LEFT HEART CATHETERIZATION WITH CORONARY ANGIOGRAM N/A 04/10/2013   Procedure: LEFT HEART CATHETERIZATION WITH CORONARY ANGIOGRAM;  Surgeon: Burnell Blanks, MD;  Location: [facility] CATH LAB;  Service: Cardiovascular;  Laterality: N/A;  . PTCA    . ROTATOR CUFF REPAIR Left   . SPINE SURGERY     X's 3  . TOTAL KNEE ARTHROPLASTY     right    Allergies: Alum & mag hydroxide-simeth and Oxycodone-acetaminophen  Medications: Prior to Admission medications   Medication Sig Start Date End Date Taking? Authorizing  Provider  aspirin 81 MG tablet Take 81 mg by mouth daily.    [provider]  busPIRone (BUSPAR) 10 MG tablet Take 10 mg by mouth 2 (two) times daily as needed (anxiety).     [provider]  carvedilol (COREG) 6.25 MG tablet Take 3.125 mg by mouth 2 (two) times daily with a meal.     [provider]  finasteride (PROSCAR) 5 MG tablet Take 5 mg by mouth daily.      [provider]  furosemide (LASIX) 20 MG tablet Take 1 tablet (20 mg total) by mouth as needed for edema. 02/12/15   Imogene Burn, PA-C  HYDROcodone-acetaminophen (NORCO/VICODIN) 5-325 MG per tablet Take 1 tablet by mouth every 6 (six) hours as needed for moderate pain. Patient taking differently: Take 0.5-1 tablets by mouth at bedtime as needed for moderate pain (Takes only at bedtime if needed - starts with half tablet, and will take other half if needed).  03/15/14   Alvia Grove, PA-C  lisinopril (PRINIVIL,ZESTRIL) 40 MG tablet Take 20 mg by mouth 2 (two) times daily.  12/24/10   Hillary Bow, MD  meclizine (ANTIVERT) 12.5 MG tablet Take 12.5 mg by mouth 3 (three) times daily as needed for dizziness.    [provider]  nitroGLYCERIN (NITROSTAT) 0.4 MG SL tablet Place 0.4 mg under the tongue every 5 (five) minutes as needed (MAX 3 TABLETS). For chest pain    [provider]  pantoprazole (PROTONIX) 40 MG tablet Take 40 mg by mouth every evening.     [provider]  polyethylene glycol (MIRALAX / GLYCOLAX) packet Take 17 g by mouth daily as needed for mild constipation.    [provider]  rivaroxaban (XARELTO) 20 MG TABS tablet Take 20 mg by mouth daily. 04/16/16   [provider]  simvastatin (ZOCOR) 80 MG tablet Take 40 mg by mouth at bedtime.     [provider]  zolpidem (AMBIEN) 10 MG tablet Take 10 mg by mouth at bedtime. For insomnia    [provider]     Family History  Problem Relation Age of Onset  . Coronary  artery disease Mother   . Diabetes Mother   . Heart disease Mother   . Hyperlipidemia Mother   . Hypertension Mother   . Heart attack Mother   . Heart disease Father   . Hyperlipidemia Father   . Hypertension Father   . Heart attack Father   . Stroke Neg Hx     Social History   Social History  . Marital status: Married    Spouse name: N/A  . Number of children: N/A  . Years of education: N/A   Social History Main Topics  . Smoking status: Never Smoker  . Smokeless tobacco: Never Used  . Alcohol use No  . Drug use: No  . Sexual activity:  Not on file   Other Topics Concern  . Not on file   Social History Narrative  . No narrative on file     Vital Signs: There were no vitals taken for this visit.  Physical Exam  Constitutional: He is oriented to person, place, and time. He appears well-developed.  Cardiovascular: Normal rate.   Pulmonary/Chest: Effort normal. No respiratory distress.  Abdominal: Soft.  Musculoskeletal: Normal range of motion.  Neurological: He is alert and oriented to person, place, and time.  Skin: Skin is warm and dry.  Psychiatric: He has a normal mood and affect. His behavior is normal. Judgment and thought content normal.  Nursing note and vitals reviewed.   Imaging: No results found.  Labs:  CBC:  Recent Labs  07/24/16 0711  WBC 6.9  HGB 14.0  HCT 41.8  PLT 158    COAGS:  Recent Labs  07/24/16 0711  INR 1.15  APTT 36    BMP:  Recent Labs  07/24/16 0711 02/09/17 1120  NA 140  --   K 4.2  --   CL 107  --   CO2 25  --   GLUCOSE 94  --   BUN 18  --   CALCIUM 9.5  --   CREATININE 1.07 1.00  GFRNONAA >60  --   GFRAA >60  --     LIVER FUNCTION TESTS: No results for input(s): BILITOT, AST, ALT, ALKPHOS, PROT, ALBUMIN in the last 8760 hours.  Assessment: Patient with past medical history of abdominal aneurysm s/p EVAR in October 2015 presents to radiology clinic for follow-up of an atypical type II endoleak.   Patient underwent angiogram 07/24/16 with attempted repair.  He presents to clinic today without complaint of abdominal or back pain.  CTA Abdomen/Pelvis shows: 1. Patent infrarenal aortic stent graft with persistent endoleak, possibly type 3 given recent negative selective arteriography. 2. Progressive enlargement of infrarenal aortic aneurysm sac to 6.3 x 5.9 cm, previously 6.1 x 5.5. 3. Left renal artery ostial stenosis Dr. Laurence Ferrari has discussed these results with patient and wife.  No interventions planned at this time.  Will plan to see patient in follow-up in 1 year with repeat imaging. He is no longer on anti-coagulation but continues aspirin.    Signed: Docia Barrier, PA 02/09/2017, 1:54 PM   Please refer to Dr. Laurence Ferrari attestation of this note for management and plan.

## 2017-03-01 ENCOUNTER — Encounter: Payer: Self-pay | Admitting: Interventional Radiology

## 2017-03-02 ENCOUNTER — Ambulatory Visit (INDEPENDENT_AMBULATORY_CARE_PROVIDER_SITE_OTHER): Payer: Medicare Other | Admitting: Cardiology

## 2017-03-02 ENCOUNTER — Encounter: Payer: Self-pay | Admitting: Cardiology

## 2017-03-02 VITALS — BP 144/82 | HR 83 | Ht 74.0 in | Wt 190.2 lb

## 2017-03-02 DIAGNOSIS — I739 Peripheral vascular disease, unspecified: Secondary | ICD-10-CM | POA: Diagnosis not present

## 2017-03-02 DIAGNOSIS — R0600 Dyspnea, unspecified: Secondary | ICD-10-CM

## 2017-03-02 DIAGNOSIS — I5022 Chronic systolic (congestive) heart failure: Secondary | ICD-10-CM | POA: Diagnosis not present

## 2017-03-02 DIAGNOSIS — I25810 Atherosclerosis of coronary artery bypass graft(s) without angina pectoris: Secondary | ICD-10-CM | POA: Diagnosis not present

## 2017-03-02 DIAGNOSIS — I255 Ischemic cardiomyopathy: Secondary | ICD-10-CM | POA: Diagnosis not present

## 2017-03-02 NOTE — Progress Notes (Signed)
03/02/2017 Jonathan Arroyo   Sep 12, 1929  [id number]  Primary Physician Raina Mina., MD Primary Cardiologist: Dr. Angelena Form    Reason for Visit/CC: Exertional Dyspnea   HPI:  Jonathan Arroyo is a 83 y.o. male with significant CV history, presenting to clinic with complaint of new exertional dyspnea. He has h/o CAD s/p initial CABG in 1994 followed by redo CABG in 2005. He was admitted  November 2014 with chest pain c/w angina. Cardiac cath 04/10/13 and found to have patent LIMA to LAD, patent free radial graft to PDA but occluded SVG to Circumflex system with 99% distal left main stenosis supplying the Circumflex as well as severe distal Circumflex stenosis. A 2.75 x 20 mm Promus Premier DES was deployed in the distal Circumflex extending into the second OM branch. A 3.0 x 16 mm Promus Premier DES was deployed in the distal left main extending into the proximal Circumflex. This stent was post-dilated with a 3.5 x 8 mm Reynolds balloon x 2. He also has h/o AAA, followed by Dr. Oneida Alar and underwent Aortic stent graft placed October 2015 by Dr. Oneida Alar. Additional cardiac history includes moderate MR, chronic systolic HF/ ICM with EF of 45-50%, PACs as well as h/o HTN, HLD GERD and DVT/PE treated with Xarelto (now discontinued).   He has had new developments of exertional dyspnea, mainly walking up inclines with occasional CP, although CP is not as frequent as his dyspnea. He is currently asymptomatic. He denies resting dyspnea. No significant weight gain, orthopnea or PND. He reports full med compliance. EKG shows NSR with PACs. His PCP ordered a d-dimer last week that was abnormal, however CT scan of chest was negative for recurrent PE.   He also complains of bilateral leg pain, numbness and tingling, worse at night.   Current Meds  Medication Sig  . aspirin 81 MG tablet Take 81 mg by mouth daily.  . busPIRone (BUSPAR) 10 MG tablet Take 10 mg by mouth 2 (two) times daily as needed (anxiety).   .  carvedilol (COREG) 6.25 MG tablet Take 3.125 mg by mouth 2 (two) times daily with a meal.   . finasteride (PROSCAR) 5 MG tablet Take 5 mg by mouth daily.    . furosemide (LASIX) 20 MG tablet Take 1 tablet (20 mg total) by mouth as needed for edema.  [place] Kitchen lisinopril (PRINIVIL,ZESTRIL) 40 MG tablet Take 20 mg by mouth 2 (two) times daily.   . meclizine (ANTIVERT) 12.5 MG tablet Take 12.5 mg by mouth 3 (three) times daily as needed for dizziness.  . nitroGLYCERIN (NITROSTAT) 0.4 MG SL tablet Place 0.4 mg under the tongue every 5 (five) minutes as needed (MAX 3 TABLETS). For chest pain  . pantoprazole (PROTONIX) 40 MG tablet Take 40 mg by mouth every evening.   . polyethylene glycol (MIRALAX / GLYCOLAX) packet Take 17 g by mouth daily as needed for mild constipation.  . simvastatin (ZOCOR) 80 MG tablet Take 40 mg by mouth at bedtime.   . traMADol (ULTRAM) 50 MG tablet Take 50-100 mg by mouth.  . zolpidem (AMBIEN) 10 MG tablet Take 10 mg by mouth at bedtime. For insomnia   Allergies  Allergen Reactions  . Alum & Mag Hydroxide-Simeth Other (See Comments)    Unknown ([place])  . Oxycodone-Acetaminophen Other (See Comments)    unknown   Past Medical History:  Diagnosis Date  . Abdominal aneurysm without mention of rupture   . Acute venous embolism and thrombosis of unspecified deep vessels  of lower extremity   . Anxiety   . CAD (coronary artery disease)    a. s/p CABG 1994, redo 2005. b. [place] 03/2013: 2/3 patent bpg. s/p PTCA/DES to distal LM and PTCA/DES to distal LCx.  . CHF (congestive heart failure) ([place])   . Congestive heart failure (CHF) ([place])   . Esophageal reflux   . Hiatal hernia    HISTORY  . HTN (hypertension)   . Hyperlipidemia   . Ischemic cardiomyopathy    a. EF history: 2011: 35-40%, 11/2012: 45-50%. b. 03/2013: 35% by cath.  . Myocardial infarction ([place])   . Other specified gastritis without mention of hemorrhage   . Renal calculus    history  . Spinal stenosis, unspecified  region other than cervical   . Stomach pain June 2013   Hospital stay at [facility]   Family History  Problem Relation Age of Onset  . Coronary artery disease Mother   . Diabetes Mother   . Heart disease Mother   . Hyperlipidemia Mother   . Hypertension Mother   . Heart attack Mother   . Heart disease Father   . Hyperlipidemia Father   . Hypertension Father   . Heart attack Father   . Stroke Neg Hx    Past Surgical History:  Procedure Laterality Date  . ABDOMINAL AORTIC ANEURYSM REPAIR    . ABDOMINAL AORTIC ENDOVASCULAR STENT GRAFT N/A 03/14/2014   Procedure: ABDOMINAL AORTIC ENDOVASCULAR STENT GRAFT;  Surgeon: Elam Dutch, MD;  Location: [facility];  Service: Vascular;  Laterality: N/A;  . BACK SURGERY    . CORONARY ANGIOPLASTY WITH STENT PLACEMENT  04/10/2013   DES LEFT MAIN DES LEFT CIRCUMFLEX    DR [place]  . CORONARY ARTERY BYPASS GRAFT  1994 and  2005  . HIATAL HERNIA REPAIR    . IR GENERIC HISTORICAL  07/01/2016   IR RADIOLOGIST EVAL & MGMT 07/01/2016 Jacqulynn Cadet, MD [facility] INTERV RAD  . IR GENERIC HISTORICAL  07/24/2016   IR ANGIOGRAM SELECTIVE EACH ADDITIONAL VESSEL 07/24/2016 Jacqulynn Cadet, MD [facility]  . IR GENERIC HISTORICAL  07/24/2016   IR ANGIOGRAM SELECTIVE EACH ADDITIONAL VESSEL 07/24/2016 Jacqulynn Cadet, MD [facility]  . IR GENERIC HISTORICAL  07/24/2016   IR ANGIOGRAM VISCERAL SELECTIVE 07/24/2016 Jacqulynn Cadet, MD [facility]  . IR GENERIC HISTORICAL  07/24/2016   IR US GUIDE VASC ACCESS RIGHT 07/24/2016 Jacqulynn Cadet, MD [facility]  . IR GENERIC HISTORICAL  07/24/2016   IR ANGIOGRAM SELECTIVE EACH ADDITIONAL VESSEL 07/24/2016 Jacqulynn Cadet, MD [facility]  . IR GENERIC HISTORICAL  07/24/2016   IR ANGIOGRAM PELVIS SELECTIVE OR SUPRASELECTIVE 07/24/2016 Jacqulynn Cadet, MD [facility]  . IR GENERIC HISTORICAL  07/24/2016   IR ANGIOGRAM SELECTIVE EACH ADDITIONAL VESSEL 07/24/2016 Jacqulynn Cadet, MD [facility]  . IR GENERIC HISTORICAL  07/24/2016    IR EMBO ARTERIAL NOT HEMORR HEMANG INC GUIDE ROADMAPPING 07/24/2016 Jacqulynn Cadet, MD [facility]  . IR RADIOLOGIST EVAL & MGMT  08/27/2016  . IR RADIOLOGIST EVAL & MGMT  02/09/2017  . JOINT REPLACEMENT  2005   Right knee  . LEFT HEART CATHETERIZATION WITH CORONARY ANGIOGRAM N/A 04/10/2013   Procedure: LEFT HEART CATHETERIZATION WITH CORONARY ANGIOGRAM;  Surgeon: Burnell Blanks, MD;  Location: [facility] CATH LAB;  Service: Cardiovascular;  Laterality: N/A;  . PTCA    . ROTATOR CUFF REPAIR Left   . SPINE SURGERY     X's 3  . TOTAL KNEE ARTHROPLASTY     right  Social History   Social History  . Marital status: Married    Spouse name: N/A  . Number of children: N/A  . Years of education: N/A   Occupational History  . Not on file.   Social History Main Topics  . Smoking status: Never Smoker  . Smokeless tobacco: Never Used  . Alcohol use No  . Drug use: No  . Sexual activity: Not on file   Other Topics Concern  . Not on file   Social History Narrative  . No narrative on file     Review of Systems: General: negative for chills, fever, night sweats or weight changes.  Cardiovascular: negative for chest pain, dyspnea on exertion, edema, orthopnea, palpitations, paroxysmal nocturnal dyspnea or shortness of breath Dermatological: negative for rash Respiratory: negative for cough or wheezing Urologic: negative for hematuria Abdominal: negative for nausea, vomiting, diarrhea, bright red blood per rectum, melena, or hematemesis Neurologic: negative for visual changes, syncope, or dizziness All other systems reviewed and are otherwise negative except as noted above.   Physical Exam:  Blood pressure (!) 144/82, pulse 83, height 6\' 2"  (1.88 m), weight 190 lb 3.2 oz (86.3 kg), SpO2 94 %.  General appearance: alert, cooperative and no distress Neck: no carotid bruit and no JVD Lungs: clear to auscultation bilaterally Heart: regular rate and rhythm, S1, S2 normal, no murmur,  click, rub or gallop Extremities: extremities normal, atraumatic, no cyanosis or edema Pulses: decreased distal pulses bilaterally Skin: Skin color, texture, turgor normal. No rashes or lesions Neurologic: Grossly normal  EKG NSR with PACs -- personally reviewed   ASSESSMENT AND PLAN:   1. Exertional Dyspnea: Check BNP and CBC. If BNP elevated, we will have him increase dose of his home diuretics. However given CAD, and occasional associated exertional CP, we will order a NST to assess for ischemia as well as check a f/u 2D echo to see if any change in EF or change in severity of his MR.   2. CAD: extensive h/o noted above including CABG in 1994 followed by redo in 2005 and subsequent PCI in 2014. Order NST given exertional dyspnea and CP. Continue medical therapy.   3. Bilateral Leg Pain: pt with significant vascular history. He has decreased DPs bilaterally. We will check bilateral LE arterial dopplers to r/o PAD. Continue ASA and statin.   4. AAA: s/p Aortic stent graft in 2015. Followed by Dr. Oneida Alar.   5. H/o PE/DVT: treated with Xarelto. F/u CT scan last week negative for recurrent PE.   6. HTN: controlled on current regimen.   6. HLD: continue statin therapy.   7. Chronic Systolic HF/ ICM: EF 16%. He complains of exertional dyspnea. No notable edema on exam. Lungs are CTAB. We will check a BNP today and if elevated, will adjust home diuretics.   8. Moderate MR: with exertional dyspnea. Update echo to check for disease progression.   9. HLD: continue statin therapy.    Follow-Up in 1- 2 weeks after w/u is completed.   Monaca Wadas Ladoris Gene, MHS [facility] 03/02/2017 11:13 AM

## 2017-03-02 NOTE — Addendum Note (Signed)
Addended by: Eulis Foster on: 03/02/2017 03:40 PM   Modules accepted: Orders

## 2017-03-02 NOTE — Patient Instructions (Addendum)
Your physician recommends that you continue on your current medications as directed. Please refer to the Current Medication list given to you today.  Your physician recommends that you return for lab work in:  [place] has requested that you have a [place]. For further information please visit [email or web address]. Please follow instruction sheet, as given.   Your physician has requested that you have a lower extremity arterial exercise duplex. During this test, exercise and ultrasound are used to evaluate arterial blood flow in the legs. Allow one hour for this exam. There are no restrictions or special instructions.  Your physician has requested that you have an echocardiogram. Echocardiography is a painless test that uses sound waves to create images of your heart. It provides your doctor with information about the size and shape of your heart and how well your heart's chambers and valves are working. This procedure takes approximately one hour. There are no restrictions for this procedure.  Your physician recommends that you schedule a follow-up appointment in: [place]

## 2017-03-03 LAB — BASIC METABOLIC PANEL
BUN/Creatinine Ratio: 17 (ref 10–24)
BUN: 17 mg/dL (ref 8–27)
CALCIUM: 9.8 mg/dL (ref 8.6–10.2)
CO2: 25 mmol/L (ref 20–29)
Chloride: 101 mmol/L (ref 96–106)
Creatinine, Ser: 0.99 mg/dL (ref 0.76–1.27)
GFR, EST AFRICAN AMERICAN: 80 mL/min/{1.73_m2} (ref >59)
GFR, EST NON AFRICAN AMERICAN: 69 mL/min/{1.73_m2} (ref >59)
Glucose: 94 mg/dL (ref 65–99)
POTASSIUM: 4.3 mmol/L (ref 3.5–5.2)
SODIUM: 143 mmol/L (ref 134–144)

## 2017-03-03 LAB — PRO B NATRIURETIC PEPTIDE: NT-Pro BNP: 1653 pg/mL — ABNORMAL HIGH (ref 0–486)

## 2017-03-04 ENCOUNTER — Telehealth (HOSPITAL_COMMUNITY): Payer: Self-pay | Admitting: *Deleted

## 2017-03-04 ENCOUNTER — Telehealth: Payer: Self-pay

## 2017-03-04 DIAGNOSIS — I5022 Chronic systolic (congestive) heart failure: Secondary | ICD-10-CM

## 2017-03-04 DIAGNOSIS — R0602 Shortness of breath: Secondary | ICD-10-CM

## 2017-03-04 DIAGNOSIS — Z79899 Other long term (current) drug therapy: Secondary | ICD-10-CM

## 2017-03-04 DIAGNOSIS — R7989 Other specified abnormal findings of blood chemistry: Secondary | ICD-10-CM

## 2017-03-04 MED ORDER — FUROSEMIDE 20 MG PO TABS: 40.0000 mg | ORAL_TABLET | Freq: Every day | ORAL | 6 refills | Status: DC

## 2017-03-04 MED ORDER — POTASSIUM CHLORIDE ER 10 MEQ PO TBCR: 10.0000 meq | EXTENDED_RELEASE_TABLET | Freq: Every day | ORAL | 6 refills | Status: DC

## 2017-03-04 NOTE — Telephone Encounter (Signed)
Informed patient that we had already discussed lab results and medication changes. Patient has seen where he had missed a call from his mobile phone. Informed patient that he did not have voicemail on his mobile phone. Informed patient that then we called his home phone and spoke. Asked patient if he had any other questions. Patient stated no. Encouraged patient to call if he did. Patient verbalized understanding.

## 2017-03-04 NOTE — Telephone Encounter (Signed)
Follow up     Pt is calling back asking if he was called back. He said he had a call. Please call.

## 2017-03-04 NOTE — Telephone Encounter (Signed)
Patient aware of lab results. Patient will increase his lasix 40 mg by mouth daily and start taking K-dur 10 meq. Patient will go to [facility] in [place] next Thursday. Will send copy of lab results to patient's PCP.

## 2017-03-04 NOTE — Telephone Encounter (Signed)
Patient given detailed instructions per Myocardial Perfusion Study Information Sheet for the test on 03/09/17. Patient notified to arrive 15 minutes early and that it is imperative to arrive on time for appointment to keep from having the test rescheduled.  If you need to cancel or reschedule your appointment, please call the office within 24 hours of your appointment. . Patient verbalized understanding. Jonathan Arroyo    

## 2017-03-04 NOTE — Telephone Encounter (Signed)
-----   Message from Consuelo Pandy, [place] sent at 03/03/2017  5:22 PM EDT ----- BNP level is markedly elevated. This is a marker of fluid retention and acute HF. His shortness of breath is likely due to congestion in his lungs. He needs to increase Lasix. Have pt change lasix from 20 mg PRN to 40 mg daily. Get f/u BNP and BMP in 1 week. Add 10 Meq of K-dur daily.

## 2017-03-05 ENCOUNTER — Other Ambulatory Visit: Payer: Self-pay | Admitting: Cardiology

## 2017-03-05 DIAGNOSIS — I739 Peripheral vascular disease, unspecified: Secondary | ICD-10-CM

## 2017-03-09 ENCOUNTER — Other Ambulatory Visit (HOSPITAL_COMMUNITY): Payer: Medicare Other

## 2017-03-09 ENCOUNTER — Ambulatory Visit (HOSPITAL_COMMUNITY)
Admission: RE | Admit: 2017-03-09 | Payer: Medicare Other | Source: Ambulatory Visit | Attending: Cardiology | Admitting: Cardiology

## 2017-03-09 ENCOUNTER — Ambulatory Visit (HOSPITAL_COMMUNITY)
Admission: RE | Admit: 2017-03-09 | Discharge: 2017-03-09 | Disposition: A | Discharge status: Home / Self Care | Payer: Medicare Other | Source: Ambulatory Visit | Attending: Cardiovascular Disease | Admitting: Cardiovascular Disease

## 2017-03-09 ENCOUNTER — Encounter (HOSPITAL_COMMUNITY): Payer: Medicare Other

## 2017-03-09 DIAGNOSIS — I739 Peripheral vascular disease, unspecified: Secondary | ICD-10-CM

## 2017-03-12 LAB — BASIC METABOLIC PANEL
BUN / CREAT RATIO: 17 (ref 10–24)
BUN: 19 mg/dL (ref 8–27)
CALCIUM: 9.5 mg/dL (ref 8.6–10.2)
CHLORIDE: 100 mmol/L (ref 96–106)
CO2: 25 mmol/L (ref 20–29)
Creatinine, Ser: 1.11 mg/dL (ref 0.76–1.27)
GFR, EST AFRICAN AMERICAN: 70 mL/min/{1.73_m2} (ref >59)
GFR, EST NON AFRICAN AMERICAN: 60 mL/min/{1.73_m2} (ref >59)
Glucose: 69 mg/dL (ref 65–99)
POTASSIUM: 4.2 mmol/L (ref 3.5–5.2)
Sodium: 140 mmol/L (ref 134–144)

## 2017-03-12 LAB — PRO B NATRIURETIC PEPTIDE: NT-Pro BNP: 836 pg/mL — ABNORMAL HIGH (ref 0–486)

## 2017-03-15 ENCOUNTER — Other Ambulatory Visit (HOSPITAL_COMMUNITY): Payer: Medicare Other

## 2017-03-15 ENCOUNTER — Telehealth (HOSPITAL_COMMUNITY): Payer: Self-pay | Admitting: *Deleted

## 2017-03-15 ENCOUNTER — Encounter (HOSPITAL_COMMUNITY): Payer: Medicare Other

## 2017-03-15 NOTE — Telephone Encounter (Signed)
Patient given detailed instructions per Myocardial Perfusion Study Information Sheet for the test on 03/18/17 at 1000. Patient notified to arrive 15 minutes early and that it is imperative to arrive on time for appointment to keep from having the test rescheduled.  If you need to cancel or reschedule your appointment, please call the office within 24 hours of your appointment. . Patient verbalized understanding.Charly Hunton, [place]

## 2017-03-18 ENCOUNTER — Ambulatory Visit (HOSPITAL_COMMUNITY): Payer: Medicare Other | Attending: Cardiovascular Disease

## 2017-03-18 ENCOUNTER — Ambulatory Visit (HOSPITAL_BASED_OUTPATIENT_CLINIC_OR_DEPARTMENT_OTHER): Payer: Medicare Other

## 2017-03-18 DIAGNOSIS — R002 Palpitations: Secondary | ICD-10-CM | POA: Diagnosis not present

## 2017-03-18 DIAGNOSIS — I495 Sick sinus syndrome: Secondary | ICD-10-CM | POA: Diagnosis not present

## 2017-03-18 DIAGNOSIS — I5022 Chronic systolic (congestive) heart failure: Secondary | ICD-10-CM

## 2017-03-18 DIAGNOSIS — I25810 Atherosclerosis of coronary artery bypass graft(s) without angina pectoris: Secondary | ICD-10-CM

## 2017-03-18 DIAGNOSIS — I252 Old myocardial infarction: Secondary | ICD-10-CM | POA: Diagnosis not present

## 2017-03-18 DIAGNOSIS — I4891 Unspecified atrial fibrillation: Secondary | ICD-10-CM | POA: Diagnosis not present

## 2017-03-18 DIAGNOSIS — R0600 Dyspnea, unspecified: Secondary | ICD-10-CM | POA: Diagnosis not present

## 2017-03-18 DIAGNOSIS — Z8249 Family history of ischemic heart disease and other diseases of the circulatory system: Secondary | ICD-10-CM | POA: Diagnosis not present

## 2017-03-18 DIAGNOSIS — R0602 Shortness of breath: Secondary | ICD-10-CM | POA: Diagnosis not present

## 2017-03-18 DIAGNOSIS — I340 Nonrheumatic mitral (valve) insufficiency: Secondary | ICD-10-CM | POA: Insufficient documentation

## 2017-03-18 DIAGNOSIS — R9439 Abnormal result of other cardiovascular function study: Secondary | ICD-10-CM | POA: Insufficient documentation

## 2017-03-18 DIAGNOSIS — I10 Essential (primary) hypertension: Secondary | ICD-10-CM | POA: Diagnosis not present

## 2017-03-18 DIAGNOSIS — R509 Fever, unspecified: Secondary | ICD-10-CM | POA: Insufficient documentation

## 2017-03-18 LAB — MYOCARDIAL PERFUSION IMAGING
CHL CUP NUCLEAR SSS: 16
LV sys vol: 80 mL
LVDIAVOL: 126 mL (ref 62–150)
NUC STRESS TID: 0.97
Peak HR: 85 {beats}/min
RATE: 0.32
Rest HR: 67 {beats}/min
SDS: 0
SRS: 16

## 2017-03-18 MED ORDER — TECHNETIUM TC 99M TETROFOSMIN IV KIT
10.7000 | PACK | Freq: Once | INTRAVENOUS | Status: AC | PRN
  Administered 2017-03-18: 10.7 via INTRAVENOUS
  Filled 2017-03-18: qty 11

## 2017-03-18 MED ORDER — REGADENOSON 0.4 MG/5ML IV SOLN
0.4000 mg | Freq: Once | INTRAVENOUS | Status: AC
  Administered 2017-03-18: 0.4 mg via INTRAVENOUS

## 2017-03-18 MED ORDER — TECHNETIUM TC 99M TETROFOSMIN IV KIT
31.7000 | PACK | Freq: Once | INTRAVENOUS | Status: AC | PRN
  Administered 2017-03-18: 31.7 via INTRAVENOUS
  Filled 2017-03-18: qty 32

## 2017-03-19 ENCOUNTER — Ambulatory Visit (INDEPENDENT_AMBULATORY_CARE_PROVIDER_SITE_OTHER): Payer: Medicare Other | Admitting: Cardiovascular Disease

## 2017-03-19 ENCOUNTER — Encounter: Payer: Self-pay | Admitting: Cardiovascular Disease

## 2017-03-19 VITALS — BP 120/60 | HR 79 | Ht 74.0 in | Wt 189.0 lb

## 2017-03-19 DIAGNOSIS — I340 Nonrheumatic mitral (valve) insufficiency: Secondary | ICD-10-CM | POA: Diagnosis not present

## 2017-03-19 DIAGNOSIS — I25118 Atherosclerotic heart disease of native coronary artery with other forms of angina pectoris: Secondary | ICD-10-CM | POA: Diagnosis not present

## 2017-03-19 DIAGNOSIS — I5022 Chronic systolic (congestive) heart failure: Secondary | ICD-10-CM | POA: Diagnosis not present

## 2017-03-19 DIAGNOSIS — I255 Ischemic cardiomyopathy: Secondary | ICD-10-CM

## 2017-03-19 NOTE — Progress Notes (Signed)
Stress test is high risk based on scar. No ischemia. Similar to prior stress test in 2014. But if he is having worsened dyspnea and chest pain, he may need cath. I can see him today to discuss. Gerald Stabs

## 2017-03-19 NOTE — Progress Notes (Signed)
Chief Complaint  Patient presents with  . Follow-up    dyspnea   History of Present Illness: 83 yo male with history of HLD, HTN, GERD, CAD s/p CABG (1994 redo in 2005), ischemic cardiomyopathy, MR, hiatal hernia, DVT who is here today for cardiac follow up. He has been followed in the past by Dr. Lia Foyer. Redo CABG 07/04/03 with right radial graft to PDA, SVG to Circumflex. His LIMA to LAD was patent by cath. His AAA is followed by Dr. Oneida Alar in [facility]. He was admitted November 2014 with chest pain c/w angina. Cardiac cath 04/10/13 and found to have patent LIMA to LAD, patent free radial graft to PDA but occluded SVG to Circumflex system with 99% distal left main stenosis supplying the Circumflex as well as severe distal Circumflex stenosis. A 2.75 x 20 mm Promus Premier DES was deployed in the distal Circumflex extending into the second OM branch. A 3.0 x 16 mm Promus Premier DES was deployed in the distal left main extending into the proximal Circumflex. This stent was post-dilated with a 3.5 x 8 mm [place] balloon x 2. Aortic stent graft placed October 2015 by Dr. Oneida Alar. He was seen in our office March 2017 by Ellen Henri, PA-C and had weight gain and dyspnea which responded to Lasix. Echo April 2017 with LVEF=45-50%, moderate MR. He was diagnosed with a PE in December 2017 and is now on Xarelto. He was seen in our office 03/02/17 by Lyda Jester, PA-C and had c/o worsened dyspnea with some chest pain. Workup in primary care with elevated d-dimer but CTA chest negative for PE. Nuclear stress test 03/18/17 with inferior, inferolateral and apical scar but no ischemia.   He is here today for follow up of his abnormal stress test. The patient denies any chest pain, palpitations, lower extremity edema, orthopnea, PND. He has dyspnea but he tells me today that this has not changed significantly. He has baseline, constant dizziness for years. No near syncope or syncope.    Primary Care Physician: Raina Mina., MD  Past Medical History:  Diagnosis Date  . Abdominal aneurysm without mention of rupture   . Acute venous embolism and thrombosis of unspecified deep vessels of lower extremity   . Anxiety   . CAD (coronary artery disease)    a. s/p CABG 1994, redo 2005. b. [place] 03/2013: 2/3 patent bpg. s/p PTCA/DES to distal LM and PTCA/DES to distal LCx.  . CHF (congestive heart failure) ([place])   . Congestive heart failure (CHF) ([place])   . Esophageal reflux   . Hiatal hernia    HISTORY  . HTN (hypertension)   . Hyperlipidemia   . Ischemic cardiomyopathy    a. EF history: 2011: 35-40%, 11/2012: 45-50%. b. 03/2013: 35% by cath.  . Myocardial infarction ([place])   . Other specified gastritis without mention of hemorrhage   . Renal calculus    history  . Spinal stenosis, unspecified region other than cervical   . Stomach pain June 2013   Hospital stay at [facility]    Past Surgical History:  Procedure Laterality Date  . ABDOMINAL AORTIC ANEURYSM REPAIR    . ABDOMINAL AORTIC ENDOVASCULAR STENT GRAFT N/A 03/14/2014   Procedure: ABDOMINAL AORTIC ENDOVASCULAR STENT GRAFT;  Surgeon: Elam Dutch, MD;  Location: [place];  Service: Vascular;  Laterality: N/A;  . BACK SURGERY    . CORONARY ANGIOPLASTY WITH STENT PLACEMENT  04/10/2013   DES LEFT MAIN DES LEFT CIRCUMFLEX    DR  Lynn  . CORONARY ARTERY BYPASS GRAFT  1994 and  2005  . HIATAL HERNIA REPAIR    . IR GENERIC HISTORICAL  07/01/2016   IR RADIOLOGIST EVAL & MGMT 07/01/2016 Jacqulynn Cadet, MD [facility] INTER[facility] RAD  . IR GENERIC HISTORICAL  07/24/2016   IR ANGIOGRAM SELECTIVE EACH ADDITIONAL VESSEL 07/24/2016 Jacqulynn Cadet, MD [facility]  . IR GENERIC HISTORICAL  07/24/2016   IR ANGIOGRAM SELECTIVE EACH ADDITIONAL VESSEL 07/24/2016 Jacqulynn Cadet, MD [facility]  . IR GENERIC HISTORICAL  07/24/2016   IR ANGIOGRAM VISCERAL SELECTIVE 07/24/2016 Jacqulynn Cadet, MD [facility]  . IR GENERIC HISTORICAL  07/24/2016   IR US GUIDE VASC ACCESS RIGHT  07/24/2016 Jacqulynn Cadet, MD [facility]  . IR GENERIC HISTORICAL  07/24/2016   IR ANGIOGRAM SELECTIVE EACH ADDITIONAL VESSEL 07/24/2016 Jacqulynn Cadet, MD [facility]  . IR GENERIC HISTORICAL  07/24/2016   IR ANGIOGRAM PELVIS SELECTIVE OR SUPRASELECTIVE 07/24/2016 Jacqulynn Cadet, MD [facility]  . IR GENERIC HISTORICAL  07/24/2016   IR ANGIOGRAM SELECTIVE EACH ADDITIONAL VESSEL 07/24/2016 Jacqulynn Cadet, MD [facility]  . IR GENERIC HISTORICAL  07/24/2016   IR EMBO ARTERIAL NOT HEMORR HEMANG INC GUIDE ROADMAPPING 07/24/2016 Jacqulynn Cadet, MD [facility]  . IR RADIOLOGIST EVAL & MGMT  08/27/2016  . IR RADIOLOGIST EVAL & MGMT  02/09/2017  . JOINT REPLACEMENT  2005   Right knee  . LEFT HEART CATHETERIZATION WITH CORONARY ANGIOGRAM N/A 04/10/2013   Procedure: LEFT HEART CATHETERIZATION WITH CORONARY ANGIOGRAM;  Surgeon: Burnell Blanks, MD;  Location: [facility] CATH LAB;  Service: Cardiovascular;  Laterality: N/A;  . PTCA    . ROTATOR CUFF REPAIR Left   . SPINE SURGERY     X's 3  . TOTAL KNEE ARTHROPLASTY     right    Current Outpatient Prescriptions  Medication Sig Dispense Refill  . aspirin 81 MG tablet Take 81 mg by mouth daily.    . busPIRone (BUSPAR) 10 MG tablet Take 10 mg by mouth 2 (two) times daily as needed (anxiety).     . carvedilol (COREG) 6.25 MG tablet Take 3.125 mg by mouth 2 (two) times daily with a meal.     . finasteride (PROSCAR) 5 MG tablet Take 5 mg by mouth daily.      . furosemide (LASIX) 20 MG tablet Take 2 tablets (40 mg total) by mouth daily. 60 tablet 6  . lisinopril (PRINIVIL,ZESTRIL) 40 MG tablet Take 20 mg by mouth 2 (two) times daily.     . meclizine (ANTIVERT) 12.5 MG tablet Take 12.5 mg by mouth 3 (three) times daily as needed for dizziness.    . nitroGLYCERIN (NITROSTAT) 0.4 MG SL tablet Place 0.4 mg under the tongue every 5 (five) minutes as needed (MAX 3 TABLETS). For chest pain    . pantoprazole (PROTONIX) 40 MG tablet Take 40 mg by mouth every  evening.     . polyethylene glycol (MIRALAX / GLYCOLAX) packet Take 17 g by mouth daily as needed for mild constipation.    . potassium chloride (K-DUR) 10 MEQ tablet Take 1 tablet (10 mEq total) by mouth daily. 30 tablet 6  . simvastatin (ZOCOR) 80 MG tablet Take 40 mg by mouth at bedtime.     . traMADol (ULTRAM) 50 MG tablet Take 50-100 mg by mouth.    . zolpidem (AMBIEN) 10 MG tablet Take 10 mg by mouth at bedtime. For insomnia     No current facility-administered medications for this visit.  Allergies  Allergen Reactions  . Alum & Mag Hydroxide-Simeth Other (See Comments)    Unknown (Mylanta)  . Oxycodone-Acetaminophen Other (See Comments)    unknown    Social History   Social History  . Marital status: Married    Spouse name: N/A  . Number of children: N/A  . Years of education: N/A   Occupational History  . Not on file.   Social History Main Topics  . Smoking status: Never Smoker  . Smokeless tobacco: Never Used  . Alcohol use No  . Drug use: No  . Sexual activity: Not on file   Other Topics Concern  . Not on file   Social History Narrative  . No narrative on file    Family History  Problem Relation Age of Onset  . Coronary artery disease Mother   . Diabetes Mother   . Heart disease Mother   . Hyperlipidemia Mother   . Hypertension Mother   . Heart attack Mother   . Heart disease Father   . Hyperlipidemia Father   . Hypertension Father   . Heart attack Father   . Stroke Neg Hx     Review of Systems:  As stated in the HPI and otherwise negative.   BP 120/60   Pulse 79   Ht 6\' 2"  (1.88 m)   Wt 189 lb (85.7 kg)   SpO2 97%   BMI 24.27 kg/m   Physical Examination: General: Well developed, well nourished, NAD  HEENT: OP clear, mucus membranes moist  SKIN: warm, dry. No rashes. Neuro: No focal deficits  Musculoskeletal: Muscle strength 5/5 all ext  Psychiatric: Mood and affect normal  Neck: No JVD, no carotid bruits, no thyromegaly, no  lymphadenopathy.  Lungs:Clear bilaterally, no wheezes, rhonci, crackles Cardiovascular: Regular rate and rhythm. No murmurs, gallops or rubs. Abdomen:Soft. Bowel sounds present. Non-tender.  Extremities: No lower extremity edema. Pulses are 2 + in the bilateral DP/PT.  Cardiac cath 04/10/13: Left main: 99% distal stenosis.  Left Anterior Descending Artery: Large caliber vessel that courses to the apex. The vessel is occluded at the ostium. The entire proximal, mid and distal vessel including the diagonal fills from the patent IMA graft.  Circumflex Artery: Large caliber, diffusely calcified vessel. 99% ostial stenosis. 40% proximal stenosis. First obtuse marginal branch is moderate in caliber with a proximal 30% stenosis. The distal AV groove Circumflex has a long 99% stenosis just before the takeoff of the second obtuse marginal branch. The distal AV groove Circumflex beyond this becomes small in caliber and has diffuse 50% stensosis.  Right Coronary Artery: 100% proximal occlusion. The mid and distal vessel fills from the patent free radial graft.  Graft Anatomy:  Both SVG to OM system are occluded  Free radial graft to PDA is patent  LIMA graft to mid LAD is patent  Left Ventricular Angiogram: LVEF=35%. Global hypokinesis.  Impression:  1. Severe triple vessel CAD s/p 3V CABG with 2/3 patent bypass grafts.  2. Occluded vein graft to OM system with high grade disease in distal left main leading into large OM system and severe stenosis distal Circumflex.  3. Successful PTCA/DES x 1 distal Left main artery  4. Successful PTCA/DES x 1 distal left Circumflex artery  5. Moderate LV systolic dysfunction  PCI Note: The sheath was upsized to a 6 [place] system. The patient was given a weight based bolus of Angiomax and a drip was started. When the ACT was over 200, I engaged the left main with  a XB 3.0 guide. I then passed a [place] IC wire down the Circumflex artery into the distal vessel. A 2.5 x 15  mm balloon was used to pre-dilate the distal left main stenosis and then the distal Circumflex into the second OM branch. A 2.75 x 20 mm Promus Premier DES was deployed in the distal Circumflex extending into the second OM branch. This was post-dilated with a 2.75 x 15 mm [place] balloon. I then focused on lesion #2. I pulled the 2.75 x 15 [place] balloon into the distal left main and pre-dilated this lesion again. I then carefully positioned and deployed a 3.0 x 16 mm Promus Premier DES in the distal left main extending into the proximal Circumflex. This stent was post-dilated with a 3.5 x 8 mm [place] balloon x 2. There was an excellent angiographic result. The guide was removed.   Echo April 2017: Left ventricle: The cavity size was normal. Wall thickness was   increased in a pattern of moderate LVH. Apical septal   hypokinesis. Inferior hypokinesis. Systolic function was mildly   reduced. The estimated ejection fraction was in the range of 45%   to 50%. Doppler parameters are consistent with abnormal left   ventricular relaxation (grade 1 diastolic dysfunction). - Aortic valve: Trileaflet; moderately calcified leaflets. There   was no stenosis. There was trivial regurgitation. - Aorta: Ascending aortic diameter: 41 mm (S). - Ascending aorta: The ascending aorta was mildly dilated. - Mitral valve: Mildly to moderately calcified annulus. There was   moderate regurgitation. - Left atrium: The atrium was mildly dilated. - Right ventricle: The cavity size was normal. Systolic function   was normal. - Tricuspid valve: Peak RV-RA gradient (S): 22 mm Hg. - Pulmonary arteries: PA peak pressure: 25 mm Hg (S). - Inferior vena cava: The vessel was normal in size. The   respirophasic diameter changes were in the normal range (>= 50%),   consistent with normal central venous pressure.  Impressions:  - Normal LV size with moderate LV hypertrophy. EF 45-50% with   inferior and apical septal hypokinesis. Normal RV  size and   systolic function. Moderate mitral regurgitation.  EKG:  EKG is not ordered today. The ekg ordered today demonstrates   Recent Labs: 07/24/2016: Hemoglobin 14.0; Platelets 158 03/11/2017: BUN 19; Creatinine, Ser 1.11; NT-Pro BNP 836; Potassium 4.2; Sodium 140   Lipid Panel No results found for: CHOL, TRIG, HDL, CHOLHDL, VLDL, LDLCALC, LDLDIRECT   Wt Readings from Last 3 Encounters:  03/19/17 189 lb (85.7 kg)  03/02/17 190 lb 3.2 oz (86.3 kg)  08/27/16 194 lb (88 kg)     Other studies Reviewed: Additional studies/ records that were reviewed today include: . Review of the above records demonstrates:    Assessment and Plan:   1. CAD with stable angina: No chest pain suggestive of unstable angina. PCI of the circumflex and left main in November 2014.  He has had dyspnea but stress test overall unchanged. I discussed cardiac cath today but he wishes to think more about this. He will call back if he decided to pursue cath. Will continue ASA, statin, beta blocker.   2. PACs, PVCs: No palpitations. Continue beta blocker.   3. Aortic valve insufficiency: Trivial by echo April 2017  4. Ischemic CM: Last LVEF=50-55% by echo 03/18/17. Will continue beta blocker and Ace-inh.   5. Chronic Systolic CHF: Volume status is stable. Continue Lasix 20 mg po daily.   6. Hypertension: BP is controlled. No changes.  7. Hyperlipidemia: Lipids followed in primary care. Continue statin.   8. AAA: Followed by [facility] and now s/p aortic stent graft. He has been seen by IR for endoleak.   9. Mitral Regurgitation: Mild by echo 03/18/17  Current medicines are reviewed at length with the patient today.  The patient does not have concerns regarding medicines.  The following changes have been made:  no change  Labs/ tests ordered today include:   No orders of the defined types were placed in this encounter.    Disposition:   FU with me in 6-8 weeks.    Signed, Lauree Chandler,  MD 03/19/2017 1:57 PM    [facility] [place], [place], [place]  [address] Phone: [phone]; Fax: [phone]

## 2017-03-19 NOTE — Patient Instructions (Signed)
Medication Instructions:  Your physician recommends that you continue on your current medications as directed. Please refer to the Current Medication list given to you today.   Labwork: none  Testing/Procedures: none  Follow-Up: Your physician recommends that you schedule a follow-up appointment in: January--Scheduled for January 10,2019 at 10:40    Any Other Special Instructions Will Be Listed Below (If Applicable).     If you need a refill on your cardiac medications before your next appointment, please call your pharmacy.

## 2017-03-29 ENCOUNTER — Ambulatory Visit: Payer: Medicare Other | Admitting: Cardiology

## 2017-04-02 DIAGNOSIS — J310 Chronic rhinitis: Secondary | ICD-10-CM | POA: Insufficient documentation

## 2017-04-02 DIAGNOSIS — R1319 Other dysphagia: Secondary | ICD-10-CM | POA: Insufficient documentation

## 2017-04-02 DIAGNOSIS — J40 Bronchitis, not specified as acute or chronic: Secondary | ICD-10-CM | POA: Insufficient documentation

## 2017-05-10 ENCOUNTER — Other Ambulatory Visit (HOSPITAL_COMMUNITY): Payer: Self-pay | Admitting: Interventional Radiology

## 2017-05-27 ENCOUNTER — Ambulatory Visit: Payer: Medicare Other | Admitting: Cardiovascular Disease

## 2017-05-27 VITALS — BP 158/98 | HR 77 | Ht 74.0 in | Wt 192.0 lb

## 2017-05-27 DIAGNOSIS — I255 Ischemic cardiomyopathy: Secondary | ICD-10-CM

## 2017-05-27 DIAGNOSIS — I359 Nonrheumatic aortic valve disorder, unspecified: Secondary | ICD-10-CM

## 2017-05-27 DIAGNOSIS — I25118 Atherosclerotic heart disease of native coronary artery with other forms of angina pectoris: Secondary | ICD-10-CM | POA: Diagnosis not present

## 2017-05-27 DIAGNOSIS — I10 Essential (primary) hypertension: Secondary | ICD-10-CM | POA: Diagnosis not present

## 2017-05-27 DIAGNOSIS — R0602 Shortness of breath: Secondary | ICD-10-CM | POA: Diagnosis not present

## 2017-05-27 DIAGNOSIS — I5022 Chronic systolic (congestive) heart failure: Secondary | ICD-10-CM

## 2017-05-27 DIAGNOSIS — I491 Atrial premature depolarization: Secondary | ICD-10-CM | POA: Diagnosis not present

## 2017-05-27 DIAGNOSIS — I340 Nonrheumatic mitral (valve) insufficiency: Secondary | ICD-10-CM

## 2017-05-27 NOTE — Progress Notes (Signed)
Chief Complaint  Patient presents with  . Coronary Artery Disease   History of Present Illness: 84 yo male with history of HLD, HTN, GERD, CAD s/p CABG (1994 redo in 2005), ischemic cardiomyopathy, MR, hiatal hernia, DVT who is here today for cardiac follow up. He has been followed in the past by Dr. Lia Foyer. Redo CABG 07/04/03 with right radial graft to PDA, SVG to Circumflex. His LIMA to LAD was patent by cath. His AAA is followed by Dr. Oneida Alar in [facility]. He was admitted November 2014 with chest pain c/w angina. Cardiac cath 04/10/13 and found to have patent LIMA to LAD, patent free radial graft to PDA but occluded SVG to Circumflex system with 99% distal left main stenosis supplying the Circumflex as well as severe distal Circumflex stenosis. A 2.75 x 20 mm Promus Premier DES was deployed in the distal Circumflex extending into the second OM branch. A 3.0 x 16 mm Promus Premier DES was deployed in the distal left main extending into the proximal Circumflex.  Aortic stent graft placed October 2015 by Dr. Oneida Alar. He was seen in our office March 2017 by Ellen Henri, PA-C and had weight gain and dyspnea which responded to Lasix. Echo April 2017 with LVEF=45-50%, moderate MR. He was diagnosed with a PE in December 2017 and is now on Xarelto. He was seen in our office 03/02/17 by Lyda Jester, PA-C and had c/o worsened dyspnea with some chest pain. Workup in primary care with elevated d-dimer but CTA chest negative for PE. Nuclear stress test 03/18/17 with inferior, inferolateral and apical scar but no ischemia. I saw him back in the office 03/19/17 and he was feeling well. He did not wish to pursue a repeat cardiac cath.   He is here today for follow up. The patient denies any chest pain, palpitations, lower extremity edema, orthopnea, PND, dizziness, near syncope or syncope. He continues to have dyspnea with minimal exertion. This has worsened over the last few weeks. No LE edema. Not taking Lasix  daily. EKG today appears to be sinus with PACs.   Primary Care Physician: Raina Mina., MD  Past Medical History:  Diagnosis Date  . Abdominal aneurysm without mention of rupture   . Acute venous embolism and thrombosis of unspecified deep vessels of lower extremity   . Anxiety   . CAD (coronary artery disease)    a. s/p CABG 1994, redo 2005. b. [place] 03/2013: 2/3 patent bpg. s/p PTCA/DES to distal LM and PTCA/DES to distal LCx.  . CHF (congestive heart failure) ([place])   . Congestive heart failure (CHF) ([place])   . Esophageal reflux   . Hiatal hernia    HISTORY  . HTN (hypertension)   . Hyperlipidemia   . Ischemic cardiomyopathy    a. EF history: 2011: 35-40%, 11/2012: 45-50%. b. 03/2013: 35% by cath.  . Myocardial infarction ([place])   . Other specified gastritis without mention of hemorrhage   . Renal calculus    history  . Spinal stenosis, unspecified region other than cervical   . Stomach pain June 2013   Hospital stay at [facility]    Past Surgical History:  Procedure Laterality Date  . ABDOMINAL AORTIC ANEURYSM REPAIR    . ABDOMINAL AORTIC ENDOVASCULAR STENT GRAFT N/A 03/14/2014   Procedure: ABDOMINAL AORTIC ENDOVASCULAR STENT GRAFT;  Surgeon: Elam Dutch, MD;  Location: [facility];  Service: Vascular;  Laterality: N/A;  . BACK SURGERY    . CORONARY ANGIOPLASTY WITH STENT PLACEMENT  04/10/2013  DES LEFT MAIN DES LEFT CIRCUMFLEX    DR Angelena Form  . CORONARY ARTERY BYPASS GRAFT  1994 and  2005  . HIATAL HERNIA REPAIR    . IR GENERIC HISTORICAL  07/01/2016   IR RADIOLOGIST EVAL & MGMT 07/01/2016 Jacqulynn Cadet, MD [facility] INTER[facility] RAD  . IR GENERIC HISTORICAL  07/24/2016   IR ANGIOGRAM SELECTIVE EACH ADDITIONAL VESSEL 07/24/2016 Jacqulynn Cadet, MD [facility]  . IR GENERIC HISTORICAL  07/24/2016   IR ANGIOGRAM SELECTIVE EACH ADDITIONAL VESSEL 07/24/2016 Jacqulynn Cadet, MD [facility]  . IR GENERIC HISTORICAL  07/24/2016   IR ANGIOGRAM VISCERAL SELECTIVE 07/24/2016 Jacqulynn Cadet, MD  [facility]  . IR GENERIC HISTORICAL  07/24/2016   IR US GUIDE VASC ACCESS RIGHT 07/24/2016 Jacqulynn Cadet, MD [facility]  . IR GENERIC HISTORICAL  07/24/2016   IR ANGIOGRAM SELECTIVE EACH ADDITIONAL VESSEL 07/24/2016 Jacqulynn Cadet, MD [facility]  . IR GENERIC HISTORICAL  07/24/2016   IR ANGIOGRAM PELVIS SELECTIVE OR SUPRASELECTIVE 07/24/2016 Jacqulynn Cadet, MD [facility]  . IR GENERIC HISTORICAL  07/24/2016   IR ANGIOGRAM SELECTIVE EACH ADDITIONAL VESSEL 07/24/2016 Jacqulynn Cadet, MD [facility]  . IR GENERIC HISTORICAL  07/24/2016   IR EMBO ARTERIAL NOT HEMORR HEMANG INC GUIDE ROADMAPPING 07/24/2016 Jacqulynn Cadet, MD [facility]  . IR RADIOLOGIST EVAL & MGMT  08/27/2016  . IR RADIOLOGIST EVAL & MGMT  02/09/2017  . JOINT REPLACEMENT  2005   Right knee  . LEFT HEART CATHETERIZATION WITH CORONARY ANGIOGRAM N/A 04/10/2013   Procedure: LEFT HEART CATHETERIZATION WITH CORONARY ANGIOGRAM;  Surgeon: Burnell Blanks, MD;  Location: [facility] CATH LAB;  Service: Cardiovascular;  Laterality: N/A;  . PTCA    . ROTATOR CUFF REPAIR Left   . SPINE SURGERY     X's 3  . TOTAL KNEE ARTHROPLASTY     right    Current Outpatient Medications  Medication Sig Dispense Refill  . aspirin 81 MG tablet Take 81 mg by mouth daily.    . busPIRone (BUSPAR) 10 MG tablet Take 10 mg by mouth 2 (two) times daily as needed (anxiety).     . carvedilol (COREG) 6.25 MG tablet Take 3.125 mg by mouth 2 (two) times daily with a meal.     . finasteride (PROSCAR) 5 MG tablet Take 5 mg by mouth daily.      . furosemide (LASIX) 20 MG tablet Take 2 tablets (40 mg total) by mouth daily. 60 tablet 6  . lisinopril (PRINIVIL,ZESTRIL) 40 MG tablet Take 20 mg by mouth 2 (two) times daily.     . meclizine (ANTIVERT) 12.5 MG tablet Take 12.5 mg by mouth 3 (three) times daily as needed for dizziness.    . nitroGLYCERIN (NITROSTAT) 0.4 MG SL tablet Place 0.4 mg under the tongue every 5 (five) minutes as needed (MAX 3 TABLETS). For  chest pain    . pantoprazole (PROTONIX) 40 MG tablet Take 40 mg by mouth every evening.     . polyethylene glycol (MIRALAX / GLYCOLAX) packet Take 17 g by mouth daily as needed for mild constipation.    . potassium chloride (K-DUR) 10 MEQ tablet Take 1 tablet (10 mEq total) by mouth daily. 30 tablet 6  . simvastatin (ZOCOR) 80 MG tablet Take 40 mg by mouth at bedtime.     . traMADol (ULTRAM) 50 MG tablet Take 50-100 mg by mouth.    . zolpidem (AMBIEN) 10 MG tablet Take 10 mg by mouth at bedtime. For insomnia  No current facility-administered medications for this visit.     Allergies  Allergen Reactions  . Alum & Mag Hydroxide-Simeth Other (See Comments)    Unknown ([place])  . Oxycodone-Acetaminophen Other (See Comments)    unknown    Social History   Socioeconomic History  . Marital status: Married    Spouse name: Not on file  . Number of children: Not on file  . Years of education: Not on file  . Highest education level: Not on file  Social Needs  . Financial resource strain: Not on file  . Food insecurity - worry: Not on file  . Food insecurity - inability: Not on file  . Transportation needs - medical: Not on file  . Transportation needs - non-medical: Not on file  Occupational History  . Not on file  Tobacco Use  . Smoking status: Never Smoker  . Smokeless tobacco: Never Used  Substance and Sexual Activity  . Alcohol use: No    Alcohol/week: 0.0 oz  . Drug use: No  . Sexual activity: Not on file  Other Topics Concern  . Not on file  Social History Narrative  . Not on file    Family History  Problem Relation Age of Onset  . Coronary artery disease Mother   . Diabetes Mother   . Heart disease Mother   . Hyperlipidemia Mother   . Hypertension Mother   . Heart attack Mother   . Heart disease Father   . Hyperlipidemia Father   . Hypertension Father   . Heart attack Father   . Stroke Neg Hx     Review of Systems:  As stated in the HPI and otherwise  negative.   BP (!) 158/98   Pulse 77   Ht 6\' 2"  (1.88 m)   Wt 192 lb (87.1 kg)   SpO2 99%   BMI 24.65 kg/m   Physical Examination:  General: Well developed, well nourished, NAD  HEENT: OP clear, mucus membranes moist  SKIN: warm, dry. No rashes. Neuro: No focal deficits  Musculoskeletal: Muscle strength 5/5 all ext  Psychiatric: Mood and affect normal  Neck: No JVD, no carotid bruits, no thyromegaly, no lymphadenopathy.  Lungs:Clear bilaterally, no wheezes, rhonci, crackles Cardiovascular: Regular rate and rhythm. No murmurs, gallops or rubs. Abdomen:Soft. Bowel sounds present. Non-tender.  Extremities: No lower extremity edema. Pulses are 2 + in the bilateral DP/PT.  Cardiac cath 04/10/13: Left main: 99% distal stenosis.  Left Anterior Descending Artery: Large caliber vessel that courses to the apex. The vessel is occluded at the ostium. The entire proximal, mid and distal vessel including the diagonal fills from the patent IMA graft.  Circumflex Artery: Large caliber, diffusely calcified vessel. 99% ostial stenosis. 40% proximal stenosis. First obtuse marginal branch is moderate in caliber with a proximal 30% stenosis. The distal AV groove Circumflex has a long 99% stenosis just before the takeoff of the second obtuse marginal branch. The distal AV groove Circumflex beyond this becomes small in caliber and has diffuse 50% stensosis.  Right Coronary Artery: 100% proximal occlusion. The mid and distal vessel fills from the patent free radial graft.  Graft Anatomy:  Both SVG to OM system are occluded  Free radial graft to PDA is patent  LIMA graft to mid LAD is patent  Left Ventricular Angiogram: LVEF=35%. Global hypokinesis.  Impression:  1. Severe triple vessel CAD s/p 3V CABG with 2/3 patent bypass grafts.  2. Occluded vein graft to OM system with high grade disease in  distal left main leading into large OM system and severe stenosis distal Circumflex.  3. Successful PTCA/DES x  1 distal Left main artery  4. Successful PTCA/DES x 1 distal left Circumflex artery  5. Moderate LV systolic dysfunction  PCI Note: The sheath was upsized to a 6 [place] system. The patient was given a weight based bolus of Angiomax and a drip was started. When the ACT was over 200, I engaged the left main with a XB 3.0 guide. I then passed a [place]oug[place] IC wire down the Circumflex artery into the distal vessel. A 2.5 x 15 mm balloon was used to pre-dilate the distal left main stenosis and then the distal Circumflex into the second OM branch. A 2.75 x 20 mm Promus Premier DES was deployed in the distal Circumflex extending into the second OM branch. This was post-dilated with a 2.75 x 15 mm [address] balloon. I then focused on lesion #2. I pulled the 2.75 x 15 [place] balloon into the distal left main and pre-dilated this lesion again. I then carefully positioned and deployed a 3.0 x 16 mm Promus Premier DES in the distal left main extending into the proximal Circumflex. This stent was post-dilated with a 3.5 x 8 mm [place] balloon x 2. There was an excellent angiographic result. The guide was removed.   Echo 03/18/17: - Left ventricle: Abnormal septal motion The cavity size was   normal. Wall thickness was increased in a pattern of severe LVH.   Systolic function was normal. The estimated ejection fraction was   in the range of 50% to 55%. - Mitral valve: Calcified annulus. Mildly thickened leaflets .   There was mild regurgitation. - Left atrium: The atrium was moderately dilated. - Atrial septum: No defect or patent foramen ovale was identified.  EKG:  EKG is ordered today. The ekg ordered today demonstrates sinus with PACS. I have reviewed this with our EP team and they agree that this sinus.   Recent Labs: 07/24/2016: Hemoglobin 14.0; Platelets 158 03/11/2017: BUN 19; Creatinine, Ser 1.11; NT-Pro BNP 836; Potassium 4.2; Sodium 140   Lipid Panel No results found for: CHOL, TRIG, HDL, CHOLHDL, VLDL, LDLCALC,  LDLDIRECT   Wt Readings from Last 3 Encounters:  05/27/17 192 lb (87.1 kg)  03/19/17 189 lb (85.7 kg)  03/02/17 190 lb 3.2 oz (86.3 kg)     Other studies Reviewed: Additional studies/ records that were reviewed today include: . Review of the above records demonstrates:    Assessment and Plan:   1. CAD with stable angina: No chest pain suggestive of unstable angina. PCI of the circumflex and left main in November 2014.  He is having worsened dyspnea. This may be his anginal equivalent. I have discussed a cardiac cath today. He prefers to try lasix first for his dyspnea. He will call back to arrange cath if dyspnea does not improved with Lasix. Will continue ASA, statin and beta blocker.    2. PACs, PVCs: Rare palpitations. His exam today shows irregularity of his heart rhythm. EKG shows sinus with PACs. I have reviewed his EKG today with our EP team who agrees that this is sinus with PACs. Continue beta blocker.   3. Aortic valve insufficiency: Trivial by echo April 2017  4. Ischemic CM: Last LVEF=50-55% by echo 03/18/17. Continue Ace-inh and beta blocker.    5. Chronic Systolic CHF: Weight is stable today but he is more dyspneic. No LE edema. He has not taken Lasix in months. I will have  him start Lasix 20 mg daily.    6. Hypertension: BP is controlled. No changes.   7. Hyperlipidemia: continue statin. Lipids followed in primary care.   8. AAA: Followed by [facility] and now s/p aortic stent graft. He has been seen by IR for endoleak.   9. Mitral Regurgitation: Mild by echo 03/18/17. Will follow  10. Dyspnea: This could be his anginal equivalent. Will attempt to diurese first but he does not appear to be grossly volume overloaded. Ultimately will need repeat cath. He will call back if he agrees to cath.   Current medicines are reviewed at length with the patient today.  The patient does not have concerns regarding medicines.  The following changes have been made:  no change  Labs/ tests  ordered today include:   Orders Placed This Encounter  Procedures  . EKG 12-Lead     Disposition:   FU with me in 4 weeks.      Signed, Lauree Chandler, MD 05/27/2017 11:37 AM    [facility] [address], [place], [place]  [address] Phone: [phone]; Fax: [phone]

## 2017-05-27 NOTE — Patient Instructions (Addendum)
Medication Instructions:  Your physician has recommended you make the following change in your medication: Take furosemide 20 mg by mouth every day for 2 weeks.  Take your potassium with this   Labwork: none  Testing/Procedures: none  Follow-Up: Your physician recommends that you schedule a follow-up appointment in a few weeks.  Scheduled for February 6,2019 at 1:40    Any Other Special Instructions Will Be Listed Below (If Applicable).     If you need a refill on your cardiac medications before your next appointment, please call your pharmacy.

## 2017-06-08 ENCOUNTER — Telehealth: Payer: Self-pay | Admitting: Cardiovascular Disease

## 2017-06-08 NOTE — Telephone Encounter (Signed)
Called patient back to inform him to resume Lasix 40 mg by mouth daily. Patient is aware.

## 2017-06-08 NOTE — Telephone Encounter (Signed)
He should resume Lasix 40 mg daily and plan to continue this dose. If weight goes down and he still has dyspnea, may need to consider cath.   Fraser Din, Can you call him tomorrow to check on him? Thanks, chris

## 2017-06-08 NOTE — Telephone Encounter (Signed)
Called patient about his message. Patient complaining of 5 lb weight gain over night and SOB. Patient stated he stopped taking his Lasix 3 days ago. Informed patient that he was suppose to take his Lasix 20 mg and a potassium pill for two weeks, per his medication instructions on 05/27/17 (see below). Patient thought he was suppose to only take for 10 days.  Informed patient to take his Lasix and his potassium this morning and continue to take it until Friday, per instructions from office visit. Informed patient that his message would be sent to Dr. Angelena Form and his nurse for further advisement.     "Medication Instructions:  Your physician has recommended you make the following change in your medication: Take furosemide 20 mg by mouth every day for 2 weeks.  Take your potassium with this"

## 2017-06-08 NOTE — Telephone Encounter (Signed)
New message    Hunter Calling to report swelling, cough, sob. Please call patient  Pt c/o swelling: STAT is pt has developed SOB within 24 hours  1) How much weight have you gained and in what time span? 4lbs in 4 days  2) If swelling, where is the swelling located? abdomen  3) Are you currently taking a fluid pill? yes  4) Are you currently SOB? Yes for  3 days  5) Do you have a log of your daily weights (if so, list)? [facility] nurse is faxing notes/log  6) Have you gained 3 pounds in a day or 5 pounds in a week? no  7) Have you traveled recently? no

## 2017-06-09 NOTE — Telephone Encounter (Signed)
I placed call to pt and left message to call back.

## 2017-06-10 ENCOUNTER — Encounter: Payer: Self-pay | Admitting: *Deleted

## 2017-06-10 NOTE — Telephone Encounter (Signed)
I would have him hold his lasix today and tomorrow. He may be dry. If has recurrence of facial numbness, he should seek medical attention. Can we set him up to see someone in the office next week? cdm

## 2017-06-10 NOTE — Telephone Encounter (Signed)
I spoke with pt. He reports today he is feeling OK.  Last evening while sitting on the couch he almost "blacked out."  He started feeling dizzy, couldn't see and saw visions of black things in his eyes.  Did not pass out.  Event lasted about 2 minutes. He did have shortness of breath yesterday and was breathing heavy last night. No shortness of breath today. Today when he got up he noticed that his left side of his face felt like it was asleep. This feeling goes from jaw to under eye. He has not had this before.  He has not had this feeling in the last 3-4 hours. No weakness or problems with speech. Did have "uncomfortable" feeling in chest last night and this AM.  Took antacid this AM with improvement  Weight today is 188.4. Yesterday was 189.7.

## 2017-06-10 NOTE — Telephone Encounter (Signed)
I spoke with pt and gave him instructions from Dr. Angelena Form. He has already taken lasix today so will hold for next 2 days.  I scheduled him to see Melina Copa, PA tomorrow at 11:30

## 2017-06-11 ENCOUNTER — Other Ambulatory Visit: Payer: Self-pay | Admitting: *Deleted

## 2017-06-11 ENCOUNTER — Ambulatory Visit: Payer: Medicare Other | Admitting: Physician Assistant

## 2017-06-11 ENCOUNTER — Encounter: Payer: Self-pay | Admitting: Physician Assistant

## 2017-06-11 VITALS — BP 140/80 | HR 83 | Ht 74.0 in | Wt 194.0 lb

## 2017-06-11 DIAGNOSIS — R2990 Unspecified symptoms and signs involving the nervous system: Secondary | ICD-10-CM

## 2017-06-11 DIAGNOSIS — I2510 Atherosclerotic heart disease of native coronary artery without angina pectoris: Secondary | ICD-10-CM | POA: Diagnosis not present

## 2017-06-11 DIAGNOSIS — R0600 Dyspnea, unspecified: Secondary | ICD-10-CM

## 2017-06-11 DIAGNOSIS — I5042 Chronic combined systolic (congestive) and diastolic (congestive) heart failure: Secondary | ICD-10-CM

## 2017-06-11 DIAGNOSIS — I10 Essential (primary) hypertension: Secondary | ICD-10-CM | POA: Diagnosis not present

## 2017-06-11 DIAGNOSIS — Z86711 Personal history of pulmonary embolism: Secondary | ICD-10-CM | POA: Diagnosis not present

## 2017-06-11 LAB — TROPONIN T: Troponin T TROPT: <0.011 ng/mL (ref <0.011)

## 2017-06-11 LAB — CBC
HEMATOCRIT: 41 % (ref 37.5–51.0)
Hemoglobin: 14 g/dL (ref 13.0–17.7)
MCH: 31.7 pg (ref 26.6–33.0)
MCHC: 34.1 g/dL (ref 31.5–35.7)
MCV: 93 fL (ref 79–97)
PLATELETS: 160 10*3/uL (ref 150–379)
RBC: 4.41 x10E6/uL (ref 4.14–5.80)
RDW: 14 % (ref 12.3–15.4)
WBC: 7.4 10*3/uL (ref 3.4–10.8)

## 2017-06-11 LAB — BASIC METABOLIC PANEL
BUN/Creatinine Ratio: 14 (ref 10–24)
BUN: 15 mg/dL (ref 8–27)
CALCIUM: 10 mg/dL (ref 8.6–10.2)
CO2: 27 mmol/L (ref 20–29)
Chloride: 102 mmol/L (ref 96–106)
Creatinine, Ser: 1.07 mg/dL (ref 0.76–1.27)
GFR, EST AFRICAN AMERICAN: 73 mL/min/{1.73_m2} (ref >59)
GFR, EST NON AFRICAN AMERICAN: 63 mL/min/{1.73_m2} (ref >59)
Glucose: 87 mg/dL (ref 65–99)
Potassium: 4 mmol/L (ref 3.5–5.2)
Sodium: 141 mmol/L (ref 134–144)

## 2017-06-11 LAB — PROTIME-INR
INR: 1 (ref 0.8–1.2)
PROTHROMBIN TIME: 10.7 s (ref 9.1–12.0)

## 2017-06-11 MED ORDER — FUROSEMIDE 20 MG PO TABS: 20.0000 mg | ORAL_TABLET | Freq: Every day | ORAL | Status: DC

## 2017-06-11 NOTE — Progress Notes (Addendum)
Cardiology Office Note    Date:  06/11/2017  ID:  Jonathan Arroyo, DOB 08/04/1929, MRN [id number] PCP:  Raina Mina., MD  Cardiologist:  Dr. Angelena Form   Chief Complaint: f/u shortness of breath  History of Present Illness:  Jonathan Arroyo is a 84 y.o. male with history of CAD s/p CABG (1994 redo in 2005, DES to dCx and LM in 2014), ischemic cardiomyopathy/chronic combined CHF, mild MR, AAA s/p stent graft 2015 (followed by [facility], with endoleak followed by IR), HLD, HTN, GERD, hiatal hernia, DVT, PE 04/2016, PACS/PVCs, who is here today for cardiac follow up. He underwent CABG 07/04/03 with right radial graft to PDA, SVG to Circumflex. The cath in 2014 s/p PCI was the last time he was cathed. He had clinical CHF in 07/2015 responsive to Lasix with EF 45-50% and moderate MR at that time. He was diagnosed with a PE in December 2017 in [place] and was on Xarelto for a period of time. Anticoagulation was discontinued in 10/2016 by PCP after patient wished to come off of this.   More recently he was seen in our office 03/02/17 by Lyda Jester, PA-C and had c/o worsened dyspnea with some chest pain. BNP was 836. Workup in primary care showed elevated d-dimer but CTA chest negative for PE. Nuclear stress test 03/18/17 showed inferior, inferolateral and apical scar but no ischemia. 2D Echo 03/18/17 showed abnormal septal motion, severe LVH, EF 50-55%, mild MR, mod LAE. Dr. Angelena Form had seen him back in 03/2017 at which time he was feeling better so cath was not pursued. At f/u 05/27/17 he came back and reported continued DOE with minimal exertion. EKG showed PACs. Dr. Angelena Form advocated for definitive cath, but the patient elected a trial of increased Lasix in case symptoms were indicative of heart failure. Otherwise recent labs 02/2017 showed Cr 1.11, K 4.2; last CBC in 07/2016 showed Hgb 14.   He returns to discuss the following:  1) Dyspnea on exertion - gradually worsening over the last few weeks, no  significant difference with Lasix, occurs even with minimal exertion. He reports a few weeks ago he had a "really hard" chest pain but did not seek care. He also had some tightness last night for a few minutes that resolved with 1 Tums. He has not had any pain today. He is not SOB at rest. No LEE, orthopnea.  2) New neurology symptoms - he has had mild chronic dizziness for approximately 1 year. He has been on PRN meclizine. On the evening of the 23rd, he was watching TV and suddenly felt like he was going to pass out. He started feeling dizzy and had visions of black things in his eyes. He also developed left sided vision loss transiently. (He describes this as if he were to read the word P-H-A-R-M-A-C-Y, he would have only been able to see M-A-C-Y. This lasted 30 minutes and resolved spontaneously. He also had left facial numbness upon waking the following morning lasting several hours. He denies any acute weakness on one side of his body, slurred speech or history of these symptoms. He reports ongoing mild dizziness in clinic with blood pressure 140/80 and pulse in the 80s. This is back to his baseline. Dr .Camillia Herter nurse had called the patient on the 24th to check on him at which time he reported those symptoms and was advised to seek care, but he elected not to do so. He was advised to hold his Lasix x 2 days then  resume.   Past Medical History:  Diagnosis Date  . Abdominal aneurysm without mention of rupture    a. s/p stent graft 2015 by [facility].  . Acute venous embolism and thrombosis of unspecified deep vessels of lower extremity   . Anxiety   . CAD (coronary artery disease)    a. s/p CABG 1994, redo 2005. b. [place] 03/2013: 2/3 patent bpg. s/p PTCA/DES to distal LM and PTCA/DES to distal LCx.  . Chronic combined systolic and diastolic CHF (congestive heart failure) ([place])   . Esophageal reflux   . Hiatal hernia    HISTORY  . HTN (hypertension)   . Hyperlipidemia   . Ischemic  cardiomyopathy    a. EF history: 2011: 35-40%, 11/2012: 45-50%. b. 03/2013: 35% by cath.  . Mild mitral regurgitation   . Myocardial infarction ([place])   . Other specified gastritis without mention of hemorrhage   . Premature atrial contractions   . Pulmonary embolism ([place]) 04/2016  . PVC's (premature ventricular contractions)   . Renal calculus    history  . Spinal stenosis, unspecified region other than cervical   . Stomach pain June 2013   Hospital stay at [facility]    Past Surgical History:  Procedure Laterality Date  . ABDOMINAL AORTIC ANEURYSM REPAIR    . ABDOMINAL AORTIC ENDOVASCULAR STENT GRAFT N/A 03/14/2014   Procedure: ABDOMINAL AORTIC ENDOVASCULAR STENT GRAFT;  Surgeon: Elam Dutch, MD;  Location: [place];  Service: Vascular;  Laterality: N/A;  . BACK SURGERY    . CORONARY ANGIOPLASTY WITH STENT PLACEMENT  04/10/2013   DES LEFT MAIN DES LEFT CIRCUMFLEX    DR Mosby  . CORONARY ARTERY BYPASS GRAFT  1994 and  2005  . HIATAL HERNIA REPAIR    . IR GENERIC HISTORICAL  07/01/2016   IR RADIOLOGIST EVAL & MGMT 07/01/2016 Jacqulynn Cadet, MD [facility] INTERV RAD  . IR GENERIC HISTORICAL  07/24/2016   IR ANGIOGRAM SELECTIVE EACH ADDITIONAL VESSEL 07/24/2016 Jacqulynn Cadet, MD [facility]  . IR GENERIC HISTORICAL  07/24/2016   IR ANGIOGRAM SELECTIVE EACH ADDITIONAL VESSEL 07/24/2016 Jacqulynn Cadet, MD [facility]  . IR GENERIC HISTORICAL  07/24/2016   IR ANGIOGRAM VISCERAL SELECTIVE 07/24/2016 Jacqulynn Cadet, MD [facility]  . IR GENERIC HISTORICAL  07/24/2016   IR US GUIDE VASC ACCESS RIGHT 07/24/2016 Jacqulynn Cadet, MD [facility]  . IR GENERIC HISTORICAL  07/24/2016   IR ANGIOGRAM SELECTIVE EACH ADDITIONAL VESSEL 07/24/2016 Jacqulynn Cadet, MD [facility]  . IR GENERIC HISTORICAL  07/24/2016   IR ANGIOGRAM PELVIS SELECTIVE OR SUPRASELECTIVE 07/24/2016 Jacqulynn Cadet, MD [facility]  . IR GENERIC HISTORICAL  07/24/2016   IR ANGIOGRAM SELECTIVE EACH ADDITIONAL VESSEL 07/24/2016 Jacqulynn Cadet, MD [facility]  . IR GENERIC HISTORICAL  07/24/2016   IR EMBO ARTERIAL NOT HEMORR HEMANG INC GUIDE ROADMAPPING 07/24/2016 Jacqulynn Cadet, MD [facility]  . IR RADIOLOGIST EVAL & MGMT  08/27/2016  . IR RADIOLOGIST EVAL & MGMT  02/09/2017  . JOINT REPLACEMENT  2005   Right knee  . LEFT HEART CATHETERIZATION WITH CORONARY ANGIOGRAM N/A 04/10/2013   Procedure: LEFT HEART CATHETERIZATION WITH CORONARY ANGIOGRAM;  Surgeon: Burnell Blanks, MD;  Location: [facility] CATH LAB;  Service: Cardiovascular;  Laterality: N/A;  . PTCA    . ROTATOR CUFF REPAIR Left   . SPINE SURGERY     X's 3  . TOTAL KNEE ARTHROPLASTY     right    Current Medications: Current Meds  Medication Sig  .  aspirin 81 MG tablet Take 81 mg by mouth daily.  . busPIRone (BUSPAR) 10 MG tablet Take 10 mg by mouth 2 (two) times daily as needed (anxiety).   . carvedilol (COREG) 6.25 MG tablet Take 3.125 mg by mouth 2 (two) times daily with a meal.   . finasteride (PROSCAR) 5 MG tablet Take 5 mg by mouth daily.    . furosemide (LASIX) 20 MG tablet Take 2 tablets (40 mg total) by mouth daily.  Marland Kitchen lisinopril (PRINIVIL,ZESTRIL) 40 MG tablet Take 20 mg by mouth 2 (two) times daily.   . meclizine (ANTIVERT) 12.5 MG tablet Take 12.5 mg by mouth 3 (three) times daily as needed for dizziness.  . nitroGLYCERIN (NITROSTAT) 0.4 MG SL tablet Place 0.4 mg under the tongue every 5 (five) minutes as needed (MAX 3 TABLETS). For chest pain  . pantoprazole (PROTONIX) 40 MG tablet Take 40 mg by mouth every evening.   . polyethylene glycol (MIRALAX / GLYCOLAX) packet Take 17 g by mouth daily as needed for mild constipation.  . potassium chloride (K-DUR) 10 MEQ tablet Take 1 tablet (10 mEq total) by mouth daily.  . simvastatin (ZOCOR) 80 MG tablet Take 40 mg by mouth at bedtime.   . traMADol (ULTRAM) 50 MG tablet Take 50-100 mg by mouth.  . zolpidem (AMBIEN) 10 MG tablet Take 10 mg by mouth at bedtime. For insomnia     Allergies:   Alum  & mag hydroxide-simeth and Oxycodone-acetaminophen   Social History   Socioeconomic History  . Marital status: Married    Spouse name: None  . Number of children: None  . Years of education: None  . Highest education level: None  Social Needs  . Financial resource strain: None  . Food insecurity - worry: None  . Food insecurity - inability: None  . Transportation needs - medical: None  . Transportation needs - non-medical: None  Occupational History  . None  Tobacco Use  . Smoking status: Never Smoker  . Smokeless tobacco: Never Used  Substance and Sexual Activity  . Alcohol use: No    Alcohol/week: 0.0 oz  . Drug use: No  . Sexual activity: None  Other Topics Concern  . None  Social History Narrative  . None     Family History:  Family History  Problem Relation Age of Onset  . Coronary artery disease Mother   . Diabetes Mother   . Heart disease Mother   . Hyperlipidemia Mother   . Hypertension Mother   . Heart attack Mother   . Heart disease Father   . Hyperlipidemia Father   . Hypertension Father   . Heart attack Father   . Stroke Neg Hx     ROS:   Please see the history of present illness.  All other systems are reviewed and otherwise negative.    PHYSICAL EXAM:   VS:  BP 140/80   Pulse 83   Ht 6\' 2"  (1.88 m)   Wt 194 lb (88 kg)   SpO2 97%   BMI 24.91 kg/m   BMI: Body mass index is 24.91 kg/m. GEN: Well nourished, well developed elderly WM, in no acute distress. Jovial. HEENT: normocephalic, atraumatic Neck: no JVD, carotid bruits, or masses Cardiac: RRR; no murmurs, rubs, or gallops, no edema  Respiratory:  clear to auscultation bilaterally, normal work of breathing GI: soft, nontender, nondistended, + BS MS: no deformity or atrophy  Skin: warm and dry, no rash Neuro:  Alert and Oriented x 3,  Strength and sensation are intact, follows commands, strength 5/5 equal bilaterally, no facial asymmetry, no aphasia, speech clear, gait chronically  abnormal per patient Psych: euthymic mood, full affect  Wt Readings from Last 3 Encounters:  06/11/17 194 lb (88 kg)  05/27/17 192 lb (87.1 kg)  03/19/17 189 lb (85.7 kg)      Studies/Labs Reviewed:   EKG:  EKG was ordered today and personally reviewed by me and demonstrates NSR 62bpm with sinus arrhythmia LVH, prior inferior infarct with TWI III, avF, V6, flattening V5, rhythm strip without heart block (P wave amplitude difficult to see in some leads)  Recent Labs: 07/24/2016: Hemoglobin 14.0; Platelets 158 03/11/2017: BUN 19; Creatinine, Ser 1.11; NT-Pro BNP 836; Potassium 4.2; Sodium 140   Lipid Panel No results found for: CHOL, TRIG, HDL, CHOLHDL, VLDL, LDLCALC, LDLDIRECT  Additional studies/ records that were reviewed today include: Summarized above.    ASSESSMENT & PLAN:   1. Dyspnea/chest tightness - symptoms have persisted despite diuresis. He has also had some chest pain which he does not feel is like prior angina, but certainly concerning given that it has been felt that he would benefit from heart cath. Now further complicating the picture are his probable stroke-vs-TIA symptoms on the 23rd, which need to be evaluated prior to proceeding with cath to make sure he is stable to do so. I discussed the case with Dr. Marlou Porch (DOD) as Dr. Angelena Form was scrubbed in a case at the hospital. Dr. Marlou Porch also evaluated the patient. Our recommendation was for the patient to be admitted to the hospital for further workup to include brain imaging, neurologic consultation, CXR, labwork, and proceed with [facility] on Monday if neurology feels it is safe to proceed. Unfortunately the patient does not wish to be admitted. His wife recently had eye surgery and cannot drive herself home from the hospital, and therefore he does not want to go. Their children are currently out of town and they do not wish to call upon anyone else to drive her home to [place]. She does not want to stay at the hospital  overnight either. They understand that declining admission would be against medical advice due to risk of acute decompensation, worsening clinical status, or death. He is agreeable to labwork today. I asked him if his labwork showed evidence of a heart attack would he consider being admitted - he states he most certainly would if it showed a heart attack or stroke. I explained to him we had a strong suspicion he had actually already had a stroke, but that still doesn't change his decision to go home today. Given his recent neurologic symptoms we have also advised he not drive either. Per discussion with Dr. Marlou Porch will need to get him into neurology ASAP, then a repeat appointment back with Dr. Angelena Form to discuss next steps. [facility] is closed after noon today so the nurse I am working with today has made a note to herself to call ASAP on Monday to help facilitate this. 911 precautions reviewed with patient. 2. Stroke-like symptoms - see above. Neuro exam today is nonfocal. Continue aspirin. Await neurology input. Per Dr. Marlou Porch, hold off on imaging pending neuro's input. The patient feels neurologically back to baseline which argues against a bleed. We did discuss this is still possible with the patient but again he does not want to be acutely admitted. Likely needs carotid duplex but will await the above stat labs before ordering in case he ends up agreeing to admission  due to any acute abnormality. 3. Chronic combined CHF - I agree that patient does not appear acutely volume overloaded. He was originally on 20mg  daily, then 40mg  daily, then had stroke-like symptoms. Will continue Lasix at the lower 20mg  daily dose and follow. As above, likely needs definitive right and left heart cath. 4. CAD - see above. Continue present regimen. 5. HTN - follow with resumption of lower dose Lasix. 6. History of pulmonary embolism - he is not tachycardic, tachypneic or hypoxic. Per discussion with Dr. Marlou Porch,  hold off on d-dimer pending evaluation of the above.  Disposition: F/u with Dr. Angelena Form as above, pending neurologic workup.   Medication Adjustments/Labs and Tests Ordered: Current medicines are reviewed at length with the patient today.  Concerns regarding medicines are outlined above. Medication changes, Labs and Tests ordered today are summarized above and listed in the Patient Instructions accessible in Encounters.   Signed, Charlie Pitter, PA-C  06/11/2017 12:46 PM    [facility] [address], [place], [place]  [address] Phone: [phone]; Fax: [phone]

## 2017-06-11 NOTE — Patient Instructions (Addendum)
Medication Instructions:  1. DECREASE LASIX 20 MG BY MOUTH ONCE DAILY  Labwork: Your physician recommends that you have the following lab work today: PT/INR, BMET, CBC, TSH, TROPONIN  Testing/Procedures: NONE ORDERED TODAY  Follow-Up: I WILL CALL YOU Monday WITH AN APPOINTMENT WITH [facility]. AFTER WE HAVE THAT APPOINTMENT WE WILL SCHEDULE YOU AN APPOINTMENT TO FOLLOW UP WITH DR. Angelena Form.  Any Other Special Instructions Will Be Listed Below (If Applicable).     If you need a refill on your cardiac medications before your next appointment, please call your pharmacy.

## 2017-06-12 LAB — TSH: TSH: 4.79 u[IU]/mL — AB (ref 0.450–4.500)

## 2017-06-14 ENCOUNTER — Other Ambulatory Visit: Payer: Self-pay | Admitting: *Deleted

## 2017-06-14 DIAGNOSIS — R2990 Unspecified symptoms and signs involving the nervous system: Secondary | ICD-10-CM

## 2017-06-14 DIAGNOSIS — I639 Cerebral infarction, unspecified: Principal | ICD-10-CM

## 2017-06-14 NOTE — Progress Notes (Signed)
ERROR

## 2017-06-14 NOTE — Addendum Note (Signed)
Addended by: Marlis Edelson C on: 06/14/2017 01:18 PM   Modules accepted: Orders

## 2017-06-15 ENCOUNTER — Telehealth: Payer: Self-pay | Admitting: Neurology

## 2017-06-15 ENCOUNTER — Other Ambulatory Visit: Payer: Self-pay

## 2017-06-15 ENCOUNTER — Encounter: Payer: Self-pay | Admitting: Neurology

## 2017-06-15 ENCOUNTER — Ambulatory Visit: Payer: Medicare Other | Admitting: Physician Assistant

## 2017-06-15 ENCOUNTER — Ambulatory Visit: Payer: Medicare Other | Admitting: Neurology

## 2017-06-15 ENCOUNTER — Encounter: Payer: Self-pay | Admitting: Physician Assistant

## 2017-06-15 VITALS — BP 138/40 | HR 92 | Ht 74.0 in | Wt 196.4 lb

## 2017-06-15 VITALS — BP 125/73 | HR 60 | Ht 74.0 in | Wt 195.4 lb

## 2017-06-15 DIAGNOSIS — G459 Transient cerebral ischemic attack, unspecified: Secondary | ICD-10-CM

## 2017-06-15 DIAGNOSIS — R251 Tremor, unspecified: Secondary | ICD-10-CM | POA: Diagnosis not present

## 2017-06-15 DIAGNOSIS — R0609 Other forms of dyspnea: Secondary | ICD-10-CM

## 2017-06-15 DIAGNOSIS — I25708 Atherosclerosis of coronary artery bypass graft(s), unspecified, with other forms of angina pectoris: Secondary | ICD-10-CM

## 2017-06-15 DIAGNOSIS — I255 Ischemic cardiomyopathy: Secondary | ICD-10-CM | POA: Diagnosis not present

## 2017-06-15 DIAGNOSIS — R269 Unspecified abnormalities of gait and mobility: Secondary | ICD-10-CM | POA: Diagnosis not present

## 2017-06-15 DIAGNOSIS — G629 Polyneuropathy, unspecified: Secondary | ICD-10-CM

## 2017-06-15 DIAGNOSIS — I2782 Chronic pulmonary embolism: Secondary | ICD-10-CM

## 2017-06-15 DIAGNOSIS — I10 Essential (primary) hypertension: Secondary | ICD-10-CM

## 2017-06-15 MED ORDER — ISOSORBIDE MONONITRATE ER 30 MG PO TB24: 30.0000 mg | ORAL_TABLET | Freq: Every day | ORAL | 3 refills | Status: DC

## 2017-06-15 MED ORDER — ASPIRIN EC 325 MG PO TBEC: 325.0000 mg | DELAYED_RELEASE_TABLET | Freq: Every day | ORAL | 0 refills | Status: DC

## 2017-06-15 NOTE — Patient Instructions (Signed)
I had a long d/w patient and his wife about his recent TIA, risk for recurrent stroke/TIAs, personally independently reviewed imaging studies and stroke evaluation results and answered questions.Continue aspirin 325 mg daily  for secondary stroke prevention as patient is reluctant to change to Plavix which we will not be able to affordand maintain strict control of hypertension with blood pressure goal below 130/90, diabetes with hemoglobin A1c goal below 6.5% and lipids with LDL cholesterol goal below 70 mg/dL. I also advised the patient to eat a healthy diet with plenty of whole grains, cereals, fruits and vegetables, exercise regularly and maintain ideal body weight .Recommend check MRI scan the brain with MRA of the brain and neck, lipid profile, hemoglobin A1c. Also check neuropathy panel labs. Check EMG nerve conduction study to confirm his neuropathy. Continue to use a cane at all times and we talked about fall prevention precautions. Follow-up with Dr. Jannifer Franklin for his mild parkinsonian tremor and neuropathy whom he has seen in the past.Followup in the future with me in  6 weeks or call earlier if necessary   Fall Prevention in the Home Falls can cause injuries. They can happen to people of all ages. There are many things you can do to make your home safe and to help prevent falls. What can I do on the outside of my home?  Regularly fix the edges of walkways and driveways and fix any cracks.  Remove anything that might make you trip as you walk through a door, such as a raised step or threshold.  Trim any bushes or trees on the path to your home.  Use bright outdoor lighting.  Clear any walking paths of anything that might make someone trip, such as rocks or tools.  Regularly check to see if handrails are loose or broken. Make sure that both sides of any steps have handrails.  Any raised decks and porches should have guardrails on the edges.  Have any leaves, snow, or ice cleared  regularly.  Use sand or salt on walking paths during winter.  Clean up any spills in your garage right away. This includes oil or grease spills. What can I do in the bathroom?  Use night lights.  Install grab bars by the toilet and in the tub and shower. Do not use towel bars as grab bars.  Use non-skid mats or decals in the tub or shower.  If you need to sit down in the shower, use a plastic, non-slip stool.  Keep the floor dry. Clean up any water that spills on the floor as soon as it happens.  Remove soap buildup in the tub or shower regularly.  Attach bath mats securely with double-sided non-slip rug tape.  Do not have throw rugs and other things on the floor that can make you trip. What can I do in the bedroom?  Use night lights.  Make sure that you have a light by your bed that is easy to reach.  Do not use any sheets or blankets that are too big for your bed. They should not hang down onto the floor.  Have a firm chair that has side arms. You can use this for support while you get dressed.  Do not have throw rugs and other things on the floor that can make you trip. What can I do in the kitchen?  Clean up any spills right away.  Avoid walking on wet floors.  Keep items that you use a lot in easy-to-reach places.  If you need to reach something above you, use a strong step stool that has a grab bar.  Keep electrical cords out of the way.  Do not use floor polish or wax that makes floors slippery. If you must use wax, use non-skid floor wax.  Do not have throw rugs and other things on the floor that can make you trip. What can I do with my stairs?  Do not leave any items on the stairs.  Make sure that there are handrails on both sides of the stairs and use them. Fix handrails that are broken or loose. Make sure that handrails are as long as the stairways.  Check any carpeting to make sure that it is firmly attached to the stairs. Fix any carpet that is loose  or worn.  Avoid having throw rugs at the top or bottom of the stairs. If you do have throw rugs, attach them to the floor with carpet tape.  Make sure that you have a light switch at the top of the stairs and the bottom of the stairs. If you do not have them, ask someone to add them for you. What else can I do to help prevent falls?  Wear shoes that: ? Do not have high heels. ? Have rubber bottoms. ? Are comfortable and fit you well. ? Are closed at the toe. Do not wear sandals.  If you use a stepladder: ? Make sure that it is fully opened. Do not climb a closed stepladder. ? Make sure that both sides of the stepladder are locked into place. ? Ask someone to hold it for you, if possible.  Clearly mark and make sure that you can see: ? Any grab bars or handrails. ? First and last steps. ? Where the edge of each step is.  Use tools that help you move around (mobility aids) if they are needed. These include: ? Canes. ? Walkers. ? Scooters. ? Crutches.  Turn on the lights when you go into a dark area. Replace any light bulbs as soon as they burn out.  Set up your furniture so you have a clear path. Avoid moving your furniture around.  If any of your floors are uneven, fix them.  If there are any pets around you, be aware of where they are.  Review your medicines with your doctor. Some medicines can make you feel dizzy. This can increase your chance of falling. Ask your doctor what other things that you can do to help prevent falls. This information is not intended to replace advice given to you by your health care provider. Make sure you discuss any questions you have with your health care provider. Document Released: 02/28/2009 Document Revised: 10/10/2015 Document Reviewed: 06/08/2014 Elsevier Interactive Patient Education  2018 [employer].  Stroke Prevention Some medical conditions and behaviors are associated with a higher chance of having a stroke. You can help prevent  a stroke by making nutrition, lifestyle, and other changes, including managing any medical conditions you may have. What nutrition changes can be made?  Eat healthy foods. You can do this by: ? Choosing foods high in fiber, such as fresh fruits and vegetables and whole grains. ? Eating at least 5 or more servings of fruits and vegetables a day. Try to fill half of your plate at each meal with fruits and vegetables. ? Choosing lean protein foods, such as lean cuts of meat, poultry without skin, fish, tofu, beans, and nuts. ? Eating low-fat dairy products. ?  Avoiding foods that are high in salt (sodium). This can help lower blood pressure. ? Avoiding foods that have saturated fat, trans fat, and cholesterol. This can help prevent high cholesterol. ? Avoiding processed and premade foods.  Follow your health care provider's specific guidelines for losing weight, controlling high blood pressure (hypertension), lowering high cholesterol, and managing diabetes. These may include: ? Reducing your daily calorie intake. ? Limiting your daily sodium intake to 1,500 milligrams (mg). ? Using only healthy fats for cooking, such as olive oil, canola oil, or sunflower oil. ? Counting your daily carbohydrate intake. What lifestyle changes can be made?  Maintain a healthy weight. Talk to your health care provider about your ideal weight.  Get at least 30 minutes of moderate physical activity at least 5 days a week. Moderate activity includes brisk walking, biking, and swimming.  Do not use any products that contain nicotine or tobacco, such as cigarettes and e-cigarettes. If you need help quitting, ask your health care provider. It may also be helpful to avoid exposure to secondhand smoke.  Limit alcohol intake to no more than 1 drink a day for nonpregnant women and 2 drinks a day for men. One drink equals 12 oz of beer, 5 oz of wine, or 1 oz of hard liquor.  Stop any illegal drug use.  Avoid taking  birth control pills. Talk to your health care provider about the risks of taking birth control pills if: ? You are over 79 years old. ? You smoke. ? You get migraines. ? You have ever had a blood clot. What other changes can be made?  Manage your cholesterol levels. ? Eating a healthy diet is important for preventing high cholesterol. If cholesterol cannot be managed through diet alone, you may also need to take medicines. ? Take any prescribed medicines to control your cholesterol as told by your health care provider.  Manage your diabetes. ? Eating a healthy diet and exercising regularly are important parts of managing your blood sugar. If your blood sugar cannot be managed through diet and exercise, you may need to take medicines. ? Take any prescribed medicines to control your diabetes as told by your health care provider.  Control your hypertension. ? To reduce your risk of stroke, try to keep your blood pressure below 130/80. ? Eating a healthy diet and exercising regularly are an important part of controlling your blood pressure. If your blood pressure cannot be managed through diet and exercise, you may need to take medicines. ? Take any prescribed medicines to control hypertension as told by your health care provider. ? Ask your health care provider if you should monitor your blood pressure at home. ? Have your blood pressure checked every year, even if your blood pressure is normal. Blood pressure increases with age and some medical conditions.  Get evaluated for sleep disorders (sleep apnea). Talk to your health care provider about getting a sleep evaluation if you snore a lot or have excessive sleepiness.  Take over-the-counter and prescription medicines only as told by your health care provider. Aspirin or blood thinners (antiplatelets or anticoagulants) may be recommended to reduce your risk of forming blood clots that can lead to stroke.  Make sure that any other medical  conditions you have, such as atrial fibrillation or atherosclerosis, are managed. What are the warning signs of a stroke? The warning signs of a stroke can be easily remembered as BEFAST.  B is for balance. Signs include: ? Dizziness. ? Loss of  balance or coordination. ? Sudden trouble walking.  E is for eyes. Signs include: ? A sudden change in vision. ? Trouble seeing.  F is for face. Signs include: ? Sudden weakness or numbness of the face. ? The face or eyelid drooping to one side.  A is for arms. Signs include: ? Sudden weakness or numbness of the arm, usually on one side of the body.  S is for speech. Signs include: ? Trouble speaking (aphasia). ? Trouble understanding.  T is for time. ? These symptoms may represent a serious problem that is an emergency. Do not wait to see if the symptoms will go away. Get medical help right away. Call your local emergency services (911 in the [place] Do not drive yourself to the hospital.  Other signs of stroke may include: ? A sudden, severe headache with no known cause. ? Nausea or vomiting. ? Seizure.  Where to find more information: For more information, visit:  American Stroke Association: www.strokeassociation.org  National Stroke Association: www.stroke.org  Summary  You can prevent a stroke by eating healthy, exercising, not smoking, limiting alcohol intake, and managing any medical conditions you may have.  Do not use any products that contain nicotine or tobacco, such as cigarettes and e-cigarettes. If you need help quitting, ask your health care provider. It may also be helpful to avoid exposure to secondhand smoke.  Remember BEFAST for warning signs of stroke. Get help right away if you or a loved one has any of these signs. This information is not intended to replace advice given to you by your health care provider. Make sure you discuss any questions you have with your health care provider. Document Released:  06/11/2004 Document Revised: 06/09/2016 Document Reviewed: 06/09/2016 Elsevier Interactive Patient Education  [employer].

## 2017-06-15 NOTE — Telephone Encounter (Signed)
Anderson Malta was calling to see if pt could have a heart cath. PT I shaving chest pain. She knows Dr.SEthi saw pt today for possible tia or stroke. Phone call transfer to Dr. Leonie Man.Marland Kitchen

## 2017-06-15 NOTE — Telephone Encounter (Signed)
I spoke to [place] and stated that if the patient is having active cardiac ischemia symptoms it was okay to do cardiac catheterization and revascularize if necessary but if it was not urgent would prefer to wait till MRI scans and stroke evaluation has been completed. She voiced understanding

## 2017-06-15 NOTE — Progress Notes (Signed)
Cardiology Office Note    Date:  06/15/2017   ID:  Jonathan Arroyo, DOB Feb 27, 1930, MRN [id number]  PCP:  Raina Mina., MD  Cardiologist: Lauree Chandler, MD  Chief Complaint  Patient presents with  . Shortness of Breath    History of Present Illness:  Jonathan Arroyo is a 84 y.o. male with history of CAD status post CABG 1994, redo 2005, DES to the circumflex and left main 2014, ICM with chronic combined CHF, AAA status post stent graft in 2015 with endoleak followed by IR, hypertension, HLD, GERD, DVT and PE 04/2016 treated with Xarelto but stopped in 10/2016, PACs, PVCs.  He is recently complaining of worsening dyspnea with some chest pain.  BNP was 836 and 02/2017.  He had an elevated d-dimer but CTA was negative for PE.  Nuclear stress test 03/2017 showed inferior, inferolateral and apical scar but no ischemia.  2D echo showed abnormal septal motion, severe LVH, EF of 50-55% with mild MR moderate LA enlargement.  Last saw Dr. Angelena Form 03/2017 at which time he was feeling better so no cath was performed suit.  05/2017 he reported worsening dyspnea on exertion with minimal minimal exertion.  Dr. Angelena Form recommended definitive calf but the patient elected a trial of increase Lasix in case symptoms were heart failure.  Last saw [name] done 06/11/17 and he had new neurological symptoms with mild chronic dizziness for a year treated with as needed meclizine.  He was having some new symptoms with visual changes and left side peripheral vision loss and there was concern for stroke versus TIA.  He refused admission.  He was sent to see Dr. Leonie Man neurologist todaywho felt he had a right brain TIA.  Recommend continuing aspirin 325 mg for secondary stroke prevention as the patient is reluctant to change to Plavix which he will be unable to afford.  Recommended MRI/MRA.  Patient comes in today accompanied by his wife.  He has significant dyspnea he on exertion with little activity.  He says the  Lasix has not helped at all.  He gets chest tightness if he goes out in the cold.  Relieved with rest.    Past Medical History:  Diagnosis Date  . Abdominal aneurysm without mention of rupture    a. s/p stent graft 2015 by [facility].  . Acute venous embolism and thrombosis of unspecified deep vessels of lower extremity   . Anxiety   . CAD (coronary artery disease)    a. s/p CABG 1994, redo 2005. b. [place] 03/2013: 2/3 patent bpg. s/p PTCA/DES to distal LM and PTCA/DES to distal LCx.  . Chronic combined systolic and diastolic CHF (congestive heart failure) ([place])   . Esophageal reflux   . Hiatal hernia    HISTORY  . HTN (hypertension)   . Hyperlipidemia   . Ischemic cardiomyopathy    a. EF history: 2011: 35-40%, 11/2012: 45-50%. b. 03/2013: 35% by cath.  . Mild mitral regurgitation   . Myocardial infarction ([place])   . Other specified gastritis without mention of hemorrhage   . Premature atrial contractions   . Pulmonary embolism ([place]) 04/2016  . PVC's (premature ventricular contractions)   . Renal calculus    history  . Spinal stenosis, unspecified region other than cervical   . Stomach pain June 2013   Hospital stay at [name]    Past Surgical History:  Procedure Laterality Date  . ABDOMINAL AORTIC ANEURYSM REPAIR    . ABDOMINAL AORTIC ENDOVASCULAR STENT GRAFT N/A  03/14/2014   Procedure: ABDOMINAL AORTIC ENDOVASCULAR STENT GRAFT;  Surgeon: Elam Dutch, MD;  Location: [facility];  Service: Vascular;  Laterality: N/A;  . BACK SURGERY    . CORONARY ANGIOPLASTY WITH STENT PLACEMENT  04/10/2013   DES LEFT MAIN DES LEFT CIRCUMFLEX    DR [place]  . CORONARY ARTERY BYPASS GRAFT  1994 and  2005  . HIATAL HERNIA REPAIR    . IR GENERIC HISTORICAL  07/01/2016   IR RADIOLOGIST EVAL & MGMT 07/01/2016 Jacqulynn Cadet, MD [facility] INTER[facility] RAD  . IR GENERIC HISTORICAL  07/24/2016   IR ANGIOGRAM SELECTIVE EACH ADDITIONAL VESSEL 07/24/2016 Jacqulynn Cadet, MD [facility]  . IR GENERIC  HISTORICAL  07/24/2016   IR ANGIOGRAM SELECTIVE EACH ADDITIONAL VESSEL 07/24/2016 Jacqulynn Cadet, MD [facility]  . IR GENERIC HISTORICAL  07/24/2016   IR ANGIOGRAM VISCERAL SELECTIVE 07/24/2016 Jacqulynn Cadet, MD [facility]  . IR GENERIC HISTORICAL  07/24/2016   IR US GUIDE VASC ACCESS RIGHT 07/24/2016 Jacqulynn Cadet, MD [facility]  . IR GENERIC HISTORICAL  07/24/2016   IR ANGIOGRAM SELECTIVE EACH ADDITIONAL VESSEL 07/24/2016 Jacqulynn Cadet, MD [facility]  . IR GENERIC HISTORICAL  07/24/2016   IR ANGIOGRAM PELVIS SELECTIVE OR SUPRASELECTIVE 07/24/2016 Jacqulynn Cadet, MD [facility]  . IR GENERIC HISTORICAL  07/24/2016   IR ANGIOGRAM SELECTIVE EACH ADDITIONAL VESSEL 07/24/2016 Jacqulynn Cadet, MD [facility]  . IR GENERIC HISTORICAL  07/24/2016   IR EMBO ARTERIAL NOT HEMORR HEMANG INC GUIDE ROADMAPPING 07/24/2016 Jacqulynn Cadet, MD [facility]  . IR RADIOLOGIST EVAL & MGMT  08/27/2016  . IR RADIOLOGIST EVAL & MGMT  02/09/2017  . JOINT REPLACEMENT  2005   Right knee  . LEFT HEART CATHETERIZATION WITH CORONARY ANGIOGRAM N/A 04/10/2013   Procedure: LEFT HEART CATHETERIZATION WITH CORONARY ANGIOGRAM;  Surgeon: Burnell Blanks, MD;  Location: [facility] CATH LAB;  Service: Cardiovascular;  Laterality: N/A;  . PTCA    . ROTATOR CUFF REPAIR Left   . SPINE SURGERY     X's 3  . TOTAL KNEE ARTHROPLASTY     right    Current Medications: Current Meds  Medication Sig  . aspirin EC 325 MG tablet Take 1 tablet (325 mg total) by mouth daily.  . busPIRone (BUSPAR) 10 MG tablet Take 10 mg by mouth 2 (two) times daily as needed (anxiety).   . carvedilol (COREG) 6.25 MG tablet Take 3.125 mg by mouth 2 (two) times daily with a meal.   . finasteride (PROSCAR) 5 MG tablet Take 5 mg by mouth daily.    Marland Kitchen lisinopril (PRINIVIL,ZESTRIL) 40 MG tablet Take 20 mg by mouth 2 (two) times daily.   . meclizine (ANTIVERT) 12.5 MG tablet Take 12.5 mg by mouth 3 (three) times daily as needed for dizziness.  .  nitroGLYCERIN (NITROSTAT) 0.4 MG SL tablet Place 0.4 mg under the tongue every 5 (five) minutes as needed (MAX 3 TABLETS). For chest pain  . pantoprazole (PROTONIX) 40 MG tablet Take 40 mg by mouth every evening.   . polyethylene glycol (MIRALAX / GLYCOLAX) packet Take 17 g by mouth daily as needed for mild constipation.  . simvastatin (ZOCOR) 80 MG tablet Take 40 mg by mouth at bedtime.   . traMADol (ULTRAM) 50 MG tablet Take 50-100 mg by mouth.  . zolpidem (AMBIEN) 10 MG tablet Take 10 mg by mouth at bedtime. For insomnia  . [DISCONTINUED] furosemide (LASIX) 20 MG tablet Take 1 tablet (20 mg total) by mouth daily.  . [DISCONTINUED]  potassium chloride (K-DUR) 10 MEQ tablet Take 1 tablet (10 mEq total) by mouth daily.     Allergies:   Alum & mag hydroxide-simeth and Oxycodone-acetaminophen   Social History   Socioeconomic History  . Marital status: Married    Spouse name: None  . Number of children: None  . Years of education: None  . Highest education level: None  Social Needs  . Financial resource strain: None  . Food insecurity - worry: None  . Food insecurity - inability: None  . Transportation needs - medical: None  . Transportation needs - non-medical: None  Occupational History  . None  Tobacco Use  . Smoking status: Never Smoker  . Smokeless tobacco: Never Used  Substance and Sexual Activity  . Alcohol use: No    Alcohol/week: 0.0 oz  . Drug use: No  . Sexual activity: None  Other Topics Concern  . None  Social History Narrative  . None     Family History:  The patient's family history includes Coronary artery disease in his mother; Diabetes in his mother; Heart attack in his father and mother; Heart disease in his father and mother; Hyperlipidemia in his father and mother; Hypertension in his father and mother.   ROS:   Please see the history of present illness.    Review of Systems  Constitution: Positive for decreased appetite and weakness.  HENT: Positive  for hearing loss.   Cardiovascular: Positive for chest pain and dyspnea on exertion.  Respiratory: Negative.   Endocrine: Negative.   Hematologic/Lymphatic: Negative.   Musculoskeletal: Negative.   Gastrointestinal: Negative.   Genitourinary: Negative.    All other systems reviewed and are negative.   PHYSICAL EXAM:   VS:  BP (!) 138/40   Pulse 92   Ht 6\' 2"  (1.88 m)   Wt 196 lb 6.4 oz (89.1 kg)   SpO2 97%   BMI 25.22 kg/m   Physical Exam  GEN: Well nourished, well developed, in no acute distress  Neck: no JVD, carotid bruits, or masses Cardiac:RRR; 1/6 systolic murmur at the left sternal border Respiratory: Decreased breath sounds but clear to auscultation bilaterally, normal work of breathing GI: soft, nontender, nondistended, + BS Ext: without cyanosis, clubbing, or edema, Good distal pulses bilaterally Neuro:  Alert and Oriented x 3 Psych: euthymic mood, full affect  Wt Readings from Last 3 Encounters:  06/15/17 196 lb 6.4 oz (89.1 kg)  06/15/17 195 lb 6.4 oz (88.6 kg)  06/11/17 194 lb (88 kg)      Studies/Labs Reviewed:   EKG:  EKG is not ordered today.   Recent Labs: 03/11/2017: NT-Pro BNP 836 06/11/2017: BUN 15; Creatinine, Ser 1.07; Hemoglobin 14.0; Platelets 160; Potassium 4.0; Sodium 141; TSH 4.790   Lipid Panel No results found for: CHOL, TRIG, HDL, CHOLHDL, VLDL, LDLCALC, LDLDIRECT  Additional studies/ records that were reviewed today include:    Cardiac cath 04/10/13: Left main: 99% distal stenosis.   Left Anterior Descending Artery: Large caliber vessel that courses to the apex. The vessel is occluded at the ostium. The entire proximal, mid and distal vessel including the diagonal fills from the patent IMA graft.   Circumflex Artery: Large caliber, diffusely calcified vessel. 99% ostial stenosis. 40% proximal stenosis. First obtuse marginal branch is moderate in caliber with a proximal 30% stenosis. The distal AV groove Circumflex has a long 99%  stenosis just before the takeoff of the second obtuse marginal branch. The distal AV groove Circumflex beyond this becomes small in  caliber and has diffuse 50% stensosis.   Right Coronary Artery: 100% proximal occlusion. The mid and distal vessel fills from the patent free radial graft.   Graft Anatomy:  Both SVG to OM system are occluded   Free radial graft to PDA is patent   LIMA graft to mid LAD is patent   Left Ventricular Angiogram: LVEF=35%. Global hypokinesis.   Impression:   1. Severe triple vessel CAD s/p 3V CABG with 2/3 patent bypass grafts.   2. Occluded vein graft to OM system with high grade disease in distal left main leading into large OM system and severe stenosis distal Circumflex.   3. Successful PTCA/DES x 1 distal Left main artery   4. Successful PTCA/DES x 1 distal left Circumflex artery   5. Moderate LV systolic dysfunction  PCI Note: The sheath was upsized to a 6 [place] system. The patient was given a weight based bolus of Angiomax and a drip was started. When the ACT was over 200, I engaged the left main with a XB 3.0 guide. I then passed a [place]ougar IC wire down the Circumflex artery into the distal vessel. A 2.5 x 15 mm balloon was used to pre-dilate the distal left main stenosis and then the distal Circumflex into the second OM branch. A 2.75 x 20 mm Promus Premier DES was deployed in the distal Circumflex extending into the second OM branch. This was post-dilated with a 2.75 x 15 mm [place] balloon. I then focused on lesion #2. I pulled the 2.75 x 15 [place] balloon into the distal left main and pre-dilated this lesion again. I then carefully positioned and deployed a 3.0 x 16 mm Promus Premier DES in the distal left main extending into the proximal Circumflex. This stent was post-dilated with a 3.5 x 8 mm [place] balloon x 2. There was an excellent angiographic result. The guide was removed.   Echo 11/1/18Study Conclusions   - Left ventricle: Abnormal septal motion The cavity size was    normal. Wall thickness was increased in a pattern of severe LVH.   Systolic function was normal. The estimated ejection fraction was   in the range of 50% to 55%. - Mitral valve: Calcified annulus. Mildly thickened leaflets .   There was mild regurgitation. - Left atrium: The atrium was moderately dilated. - Atrial septum: No defect or patent foramen ovale was identified.   Nuclear stress test 03/18/17    Study Highlights    Nuclear stress EF: 36%.  Scar in the inferior, inferolateral and apical distributions No significant ischemia.  This is a high risk study.        ASSESSMENT:    1. Atherosclerosis of coronary artery bypass graft of native heart with stable angina pectoris ([place])   2. TIA (transient ischemic attack)   3. Dyspnea on exertion   4. Essential hypertension   5. Cardiomyopathy, ischemic   6. Other chronic pulmonary embolism without acute cor pulmonale (HCC)      PLAN:  In order of problems listed above:  CAD status post CABG 1994 and redo in 2005, DES to the circumflex 2014 now with recurrent symptoms of exertional angina and significant dyspnea on exertion.  Wall motion abnormalities on nuclear stress test EF 36% 03/18/17 EF 50-55% on echo that same day.  Dr. Angelena Form recommended cardiac catheterization and patient is now willing to proceed.  Added Imdur 30 mg daily.  TIA 06/09/17 saw Dr. Leonie Man this morning.  I spoke with Dr. Leonie Man who says  he can proceed with cardiac catheterization if this is truly a TIA.  MRI/MRA ordered and if it does not show a stroke he can have the cath and PCI if indicated.  If it does show that he had a stroke he did recommend waiting a couple months unless the patient was having significant symptoms.  Patient is scheduled to see Dr. Angelena Form next week.  Hopefully the MRI results will be back.    Dyspnea on exertion patient says he has not improved with Lasix and would like to stop this.  I think this is reasonable.  Adding Imdur 30 mg  daily to see if this helps his angina and dyspnea on exertion.  Hypertension blood pressure stable  Ischemic cardiomyopathy ejection fraction 50-55% on echo by 36% on nuclear stress test 03/18/17  History of DVT and pulmonary embolus treated with Xarelto for 6 months.  Medication Adjustments/Labs and Tests Ordered: Current medicines are reviewed at length with the patient today.  Concerns regarding medicines are outlined above.  Medication changes, Labs and Tests ordered today are listed in the Patient Instructions below. Patient Instructions  Medication Instructions:  Your physician has recommended you make the following change in your medication:  1.  STOP the Lasix 2.  STOP the Potassium   Labwork: None ordered  Testing/Procedures: None ordered  Follow-Up: Your physician recommends that you schedule a follow-up appointment in: [place] (IF NEED BE CONTACT PAT, RN)   Any Other Special Instructions Will Be Listed Below (If Applicable).     If you need a refill on your cardiac medications before your next appointment, please call your pharmacy.  Sumner Boast, PA-C  06/15/2017 2:25 PM    [facility] [address], [place], [place]  [address] Phone: [phone]; Fax: [phone]

## 2017-06-15 NOTE — Telephone Encounter (Signed)
Jennifer/[facility] [phone] calling she said the pt is in their office now and she is wanting to speak with RN asap.

## 2017-06-15 NOTE — Progress Notes (Signed)
[facility] [address] [place]. [place] [address] [phone]       OFFICE CONSULT NOTE  Jonathan Arroyo Date of Birth:  [birth year] Medical Record Number:  [id number]   Referring MD:  Jeanann Lewandowsky, PA-C Reason for Referral:  TIA HPI: Jonathan Arroyo is a pleasant 84 year old Caucasian male who is accompanied today by his wife. He is referred for evaluation for a possible TIA episode. He states he was sitting on 06/09/16 after dinner when he all of a sudden noticed he was dizzy. He felt like he would about to pass out. He stooped over and when he looked up he felt off balance. He denied vertigo or headache. But he noticed that he could not see from the left half of his vision. If he were to read words hecould see on the the part of the work on the right side but not on the left. This lasted about 10 minutes and was followed by a slight pressure sensation in the back of his head. He denied any trouble with balance speech or double vision. Does have history of chronic dizziness which is had for a year. He describes this as a feeling of imbalance which may occur once or twice a day but is quite transient. He has been taking meclizine but is not sure whether it helps him or not. He denies true vertigo and sensation of fainting. He's had no brain imaging studies done. He does have chronic tinnitus in both ears and states that he hears like Cricket sounds.He has also noticed mild decrease hearing bilaterally. He denies any prior history of strokes, TIA, seizure, migraine, closed head injury with loss of consciousness. He has not had any brain imaging studies done following this episode.he also states that he has long-standing 8 history of gait imbalance and paresthesias in his feet and saw Dr. Jannifer Arroyo several years ago. He was diagnosed with neuropathy and he also has tremor in his left hand and mild Parkinson's disease. He was never placed on parkinsonian medications and he has not  followed up with Dr. Jannifer Arroyo. I am unable to obtain records of his prior neurological visits today ROS:   14 system review of systems is positive for  Shortness of breath, cough, feeling cold, dizziness, passing out and all other systems negative PMH:  Past Medical History:  Diagnosis Date  . Abdominal aneurysm without mention of rupture    a. s/p stent graft 2015 by [facility].  . Acute venous embolism and thrombosis of unspecified deep vessels of lower extremity   . Anxiety   . CAD (coronary artery disease)    a. s/p CABG 1994, redo 2005. b. [place] 03/2013: 2/3 patent bpg. s/p PTCA/DES to distal LM and PTCA/DES to distal LCx.  . Chronic combined systolic and diastolic CHF (congestive heart failure) ([place])   . Esophageal reflux   . Hiatal hernia    HISTORY  . HTN (hypertension)   . Hyperlipidemia   . Ischemic cardiomyopathy    a. EF history: 2011: 35-40%, 11/2012: 45-50%. b. 03/2013: 35% by cath.  . Mild mitral regurgitation   . Myocardial infarction ([place])   . Other specified gastritis without mention of hemorrhage   . Premature atrial contractions   . Pulmonary embolism ([place]) 04/2016  . PVC's (premature ventricular contractions)   . Renal calculus    history  . Spinal stenosis, unspecified region other than cervical   . Stomach pain June 2013   Hospital stay at [facility]  Social History:  Social History   Socioeconomic History  . Marital status: Married    Spouse name: Not on file  . Number of children: Not on file  . Years of education: Not on file  . Highest education level: Not on file  Social Needs  . Financial resource strain: Not on file  . Food insecurity - worry: Not on file  . Food insecurity - inability: Not on file  . Transportation needs - medical: Not on file  . Transportation needs - non-medical: Not on file  Occupational History  . Not on file  Tobacco Use  . Smoking status: Never Smoker  . Smokeless tobacco: Never Used  Substance and Sexual Activity   . Alcohol use: No    Alcohol/week: 0.0 oz  . Drug use: No  . Sexual activity: Not on file  Other Topics Concern  . Not on file  Social History Narrative  . Not on file    Medications:   Current Outpatient Medications on File Prior to Visit  Medication Sig Dispense Refill  . busPIRone (BUSPAR) 10 MG tablet Take 10 mg by mouth 2 (two) times daily as needed (anxiety).     . carvedilol (COREG) 6.25 MG tablet Take 3.125 mg by mouth 2 (two) times daily with a meal.     . finasteride (PROSCAR) 5 MG tablet Take 5 mg by mouth daily.      . furosemide (LASIX) 20 MG tablet Take 1 tablet (20 mg total) by mouth daily.    Marland Kitchen lisinopril (PRINIVIL,ZESTRIL) 40 MG tablet Take 20 mg by mouth 2 (two) times daily.     . meclizine (ANTIVERT) 12.5 MG tablet Take 12.5 mg by mouth 3 (three) times daily as needed for dizziness.    . nitroGLYCERIN (NITROSTAT) 0.4 MG SL tablet Place 0.4 mg under the tongue every 5 (five) minutes as needed (MAX 3 TABLETS). For chest pain    . pantoprazole (PROTONIX) 40 MG tablet Take 40 mg by mouth every evening.     . polyethylene glycol (MIRALAX / GLYCOLAX) packet Take 17 g by mouth daily as needed for mild constipation.    . potassium chloride (K-DUR) 10 MEQ tablet Take 1 tablet (10 mEq total) by mouth daily. 30 tablet 6  . simvastatin (ZOCOR) 80 MG tablet Take 40 mg by mouth at bedtime.     . traMADol (ULTRAM) 50 MG tablet Take 50-100 mg by mouth.    . zolpidem (AMBIEN) 10 MG tablet Take 10 mg by mouth at bedtime. For insomnia     No current facility-administered medications on file prior to visit.     Allergies:   Allergies  Allergen Reactions  . Alum & Mag Hydroxide-Simeth Other (See Comments)    Unknown (Mylanta)  . Oxycodone-Acetaminophen Other (See Comments)    unknown    Physical Exam General: frail elderly Caucasian male seated, in no evident distress Head: head normocephalic and atraumatic.   Neck: supple with no carotid or supraclavicular  bruits Cardiovascular: regular rate and rhythm, no murmurs Musculoskeletal: no deformity Skin:  no rash/petichiae Vascular:  Normal pulses all extremities  Neurologic Exam Mental Status: Awake and fully alert. Oriented to place and time. Recent and remote memory intact. Attention span, concentration and fund of knowledge appropriate. Mood and affect appropriate. Positive glabellar tap. Decreased facial expression. Cranial Nerves: Fundoscopic exam reveals sharp disc margins. Pupils equal, briskly reactive to light. Extraocular movements full without nystagmus. Visual fields full to confrontation. Hearing intact. Facial sensation intact.  Face, tongue, palate moves normally and symmetrically.  Motor: Normal bulk and tone. Normal strength in all tested extremity muscles.ild weakness of left grip and intrinsic hand muscles. Intermittent pill-rolling tremor in the left upper extremity Mild cogwheel rigidity at the left wrist. Sensory.:diminished touch , pinprick , position and vibratory sensation in lower extremity is from calf down. Positive Romberg sign Coordination: Rapid alternating movements normal in all extremities. Finger-to-nose and heel-to-shin performed accurately bilaterally. Gait and Station: Arises from chair without difficulty. Stance is normal. Gait demonstrates normal stride length and balance . Unable to heel, toe and tandem walk   Reflexes: 1+ and symmetric except both knee and ankle jerks are depressed. Toes downgoing.   NIHSS  0 Modified Rankin  1   ASSESSMENT: 84 year old Caucasian male with episode of transient dizziness and left-sided peripheral vision loss likely a right brain TIA he also has .long-standing history of lower extremity paresthesias likely due to chronic sensory polyneuropathy. Long-standing left upper extremity tremor and mild gait imbalance likely due to mild Parkinson's diseas PLAN: I had a long d/w patient and his wife about his recent TIA, risk for  recurrent stroke/TIAs, personally independently reviewed imaging studies and stroke evaluation results and answered questions.Continue aspirin 325 mg daily  for secondary stroke prevention as patient is reluctant to change to Plavix which we will not be able to affordand maintain strict control of hypertension with blood pressure goal below 130/90, diabetes with hemoglobin A1c goal below 6.5% and lipids with LDL cholesterol goal below 70 mg/dL. I also advised the patient to eat a healthy diet with plenty of whole grains, cereals, fruits and vegetables, exercise regularly and maintain ideal body weight .Recommend check MRI scan the brain with MRA of the brain and neck, lipid profile, hemoglobin A1c. Also check neuropathy panel labs. Check EMG nerve conduction study to confirm his neuropathy. Continue to use a cane at all times and we talked about fall prevention precautions. Follow-up with Dr. Jannifer Arroyo for his mild parkinsonian tremor and neuropathy whom he has seen in the past.  This was a prolonged consultation visit requiring 60 minutes with greater than 50% of time spent in counseling and coordination of care about TIA, tremor and neuropathy discussion and answering questions.Followup in the future with me in  6 weeks or call earlier if necessary Antony Contras, MD  [facility] [address] [place], [place] [address]  Phone [phone] Fax [phone] Note: This document was prepared with digital dictation and possible smart phrase technology. Any transcriptional errors that result from this process are unintentional.

## 2017-06-15 NOTE — Telephone Encounter (Signed)
Patient is schedule to have his MRI and MRA's at [facility] on 06/21/17 arrival time is 10:15 AM. I left a detail voicemail on his home answer machine about this. And left their number of [phone] incase he needed to reschedule for any reason.

## 2017-06-15 NOTE — Patient Instructions (Signed)
Medication Instructions:  Your physician has recommended you make the following change in your medication:  1.  STOP the Lasix 2.  STOP the Potassium   Labwork: None ordered  Testing/Procedures: None ordered  Follow-Up: Your physician recommends that you schedule a follow-up appointment in: [place] (IF NEED BE CONTACT PAT, RN)   Any Other Special Instructions Will Be Listed Below (If Applicable).     If you need a refill on your cardiac medications before your next appointment, please call your pharmacy.  Marland Kitchen

## 2017-06-17 LAB — NEUROPATHY PANEL
A/G Ratio: 1.4 (ref 0.7–1.7)
ALPHA 1: 0.2 g/dL (ref 0.0–0.4)
ALPHA 2: 0.8 g/dL (ref 0.4–1.0)
ANA: POSITIVE — AB
Albumin ELP: 4 g/dL (ref 2.9–4.4)
Angio Convert Enzyme: <15 U/L (ref 14–82)
BETA: 0.9 g/dL (ref 0.7–1.3)
GLOBULIN, TOTAL: 2.8 g/dL (ref 2.2–3.9)
Gamma Globulin: 0.9 g/dL (ref 0.4–1.8)
Sed Rate: 7 mm/hr (ref 0–30)
TSH: 3.65 u[IU]/mL (ref 0.450–4.500)
Total Protein: 6.8 g/dL (ref 6.0–8.5)
Vit D, 25-Hydroxy: 40.1 ng/mL (ref 30.0–100.0)
Vitamin B-12: 459 pg/mL (ref 232–1245)

## 2017-06-17 LAB — LIPID PANEL
CHOL/HDL RATIO: 3.9 ratio (ref 0.0–5.0)
Cholesterol, Total: 130 mg/dL (ref 100–199)
HDL: 33 mg/dL — ABNORMAL LOW (ref >39)
LDL Calculated: 63 mg/dL (ref 0–99)
Triglycerides: 168 mg/dL — ABNORMAL HIGH (ref 0–149)
VLDL CHOLESTEROL CAL: 34 mg/dL (ref 5–40)

## 2017-06-17 LAB — HEMOGLOBIN A1C
ESTIMATED AVERAGE GLUCOSE: 103 mg/dL
HEMOGLOBIN A1C: 5.2 % (ref 4.8–5.6)

## 2017-06-17 LAB — BUN: BUN: 17 mg/dL (ref 8–27)

## 2017-06-17 LAB — CREATINE: Creatine, Serum: 0.3 mg/dL (ref 0.0–0.7)

## 2017-06-18 NOTE — Telephone Encounter (Signed)
Faxed order

## 2017-06-22 ENCOUNTER — Telehealth: Payer: Self-pay

## 2017-06-22 NOTE — Telephone Encounter (Signed)
-----   Message from Garvin Fila, MD sent at 06/18/2017 12:31 PM EST ----- Kindly inform the patient that all lab work for treatable causes of neuropathy was normal except for positive ANA which is likely nonspecific and may be repeated. He can see his primary physician to discuss this.

## 2017-06-22 NOTE — Telephone Encounter (Signed)
Notes recorded by Marval Regal, RN on 06/22/2017 at 4:41 PM EST Rn call patient about his lab work. Pt had his wife on the other line to hear the results. Rn stated the lab work for neuropathy was normal. The ANA was positive likely non specific, and my repeat. Pt can see his PCP to discuss the positive ANA. Pt and wife verbalized understanding. Rn stated a fax copy will be sent to his PCP, also it will be sent via epic. Rn encourage them to call the PCP tomorrow. ------

## 2017-06-23 ENCOUNTER — Ambulatory Visit: Payer: Medicare Other | Admitting: Cardiovascular Disease

## 2017-06-23 ENCOUNTER — Encounter: Payer: Self-pay | Admitting: Cardiovascular Disease

## 2017-06-23 VITALS — BP 180/96 | HR 75 | Ht 74.0 in | Wt 196.8 lb

## 2017-06-23 DIAGNOSIS — I25110 Atherosclerotic heart disease of native coronary artery with unstable angina pectoris: Secondary | ICD-10-CM

## 2017-06-23 DIAGNOSIS — I5022 Chronic systolic (congestive) heart failure: Secondary | ICD-10-CM

## 2017-06-23 NOTE — H&P (View-Only) (Signed)
Chief Complaint  Patient presents with  . Coronary Artery Disease   History of Present Illness: 84 yo male with history of HLD, HTN, GERD, CAD s/p CABG (1994 redo in 2005), ischemic cardiomyopathy, MR, hiatal hernia, DVT who is here today for cardiac follow up. He has been followed in the past by Dr. Lia Foyer. Redo CABG 07/04/03 with right radial graft to PDA, SVG to Circumflex. His LIMA to LAD was patent by cath. His AAA is followed by Dr. Oneida Alar in [facility]. He was admitted November 2014 with chest pain c/w angina. Cardiac cath 04/10/13 and found to have patent LIMA to LAD, patent free radial graft to PDA but occluded SVG to Circumflex system with 99% distal left main stenosis supplying the Circumflex as well as severe distal Circumflex stenosis. A 2.75 x 20 mm Promus Premier DES was deployed in the distal Circumflex extending into the second OM branch. A 3.0 x 16 mm Promus Premier DES was deployed in the distal left main extending into the proximal Circumflex.  Aortic stent graft placed October 2015 by Dr. Oneida Alar. He was seen in our office March 2017 by Ellen Henri, PA-C and had weight gain and dyspnea which responded to Lasix. Echo April 2017 with LVEF=45-50%, moderate MR. He was diagnosed with a PE in December 2017 and is now on Xarelto. He was seen in our office 03/02/17 by Lyda Jester, PA-C and had c/o worsened dyspnea with some chest pain. Workup in primary care with elevated d-dimer but CTA chest negative for PE. Nuclear stress test 03/18/17 with inferior, inferolateral and apical scar but no ischemia. I saw him back in the office 03/19/17 and he was feeling well. He did not wish to pursue a repeat cardiac cath. Last visit with me 05/27/17 and he c/o dyspnea. He refused cath. Lasix was started. He was seen in our office 06/11/17 by Melina Copa, PA and there was concern for a stroke given new visual changes. He refused admission. He saw neurology who felt that he had a TIA. His ASA was increased to  325 mg daily. Pt refused to take Plavix due to cost. He was seen again 06/15/17 in our office by Estella Husk, PA and c/o ongoing dyspnea with exertional chest pain. Imdur was added. Plan was to proceed with cath if head MRI did not show a stroke.   He is here today for follow up. He continues to have dyspnea with minimal exertion and mild chest pressures. No LE edema. Some dizziness. MRI head Monday but no report yet.     Primary Care Physician: Raina Mina., MD  Past Medical History:  Diagnosis Date  . Abdominal aneurysm without mention of rupture    a. s/p stent graft 2015 by [facility].  . Acute venous embolism and thrombosis of unspecified deep vessels of lower extremity   . Anxiety   . CAD (coronary artery disease)    a. s/p CABG 1994, redo 2005. b. [place] 03/2013: 2/3 patent bpg. s/p PTCA/DES to distal LM and PTCA/DES to distal LCx.  . Chronic combined systolic and diastolic CHF (congestive heart failure) ([place])   . Esophageal reflux   . Hiatal hernia    HISTORY  . HTN (hypertension)   . Hyperlipidemia   . Ischemic cardiomyopathy    a. EF history: 2011: 35-40%, 11/2012: 45-50%. b. 03/2013: 35% by cath.  . Mild mitral regurgitation   . Myocardial infarction ([place])   . Other specified gastritis without mention of hemorrhage   .  Premature atrial contractions   . Pulmonary embolism ([place]) 04/2016  . PVC's (premature ventricular contractions)   . Renal calculus    history  . Spinal stenosis, unspecified region other than cervical   . Stomach pain June 2013   Hospital stay at [facility]    Past Surgical History:  Procedure Laterality Date  . ABDOMINAL AORTIC ANEURYSM REPAIR    . ABDOMINAL AORTIC ENDOVASCULAR STENT GRAFT N/A 03/14/2014   Procedure: ABDOMINAL AORTIC ENDOVASCULAR STENT GRAFT;  Surgeon: Elam Dutch, MD;  Location: [place];  Service: Vascular;  Laterality: N/A;  . BACK SURGERY    . CORONARY ANGIOPLASTY WITH STENT PLACEMENT  04/10/2013   DES LEFT MAIN DES LEFT  CIRCUMFLEX    DR Byers  . CORONARY ARTERY BYPASS GRAFT  1994 and  2005  . HIATAL HERNIA REPAIR    . IR GENERIC HISTORICAL  07/01/2016   IR RADIOLOGIST EVAL & MGMT 07/01/2016 Jacqulynn Cadet, MD [facility] INTER[facility] RAD  . IR GENERIC HISTORICAL  07/24/2016   IR ANGIOGRAM SELECTIVE EACH ADDITIONAL VESSEL 07/24/2016 Jacqulynn Cadet, MD [facility]  . IR GENERIC HISTORICAL  07/24/2016   IR ANGIOGRAM SELECTIVE EACH ADDITIONAL VESSEL 07/24/2016 Jacqulynn Cadet, MD [facility]  . IR GENERIC HISTORICAL  07/24/2016   IR ANGIOGRAM VISCERAL SELECTIVE 07/24/2016 Jacqulynn Cadet, MD [facility]  . IR GENERIC HISTORICAL  07/24/2016   IR US GUIDE VASC ACCESS RIGHT 07/24/2016 Jacqulynn Cadet, MD [facility]  . IR GENERIC HISTORICAL  07/24/2016   IR ANGIOGRAM SELECTIVE EACH ADDITIONAL VESSEL 07/24/2016 Jacqulynn Cadet, MD [facility]  . IR GENERIC HISTORICAL  07/24/2016   IR ANGIOGRAM PELVIS SELECTIVE OR SUPRASELECTIVE 07/24/2016 Jacqulynn Cadet, MD [facility]  . IR GENERIC HISTORICAL  07/24/2016   IR ANGIOGRAM SELECTIVE EACH ADDITIONAL VESSEL 07/24/2016 Jacqulynn Cadet, MD [facility]  . IR GENERIC HISTORICAL  07/24/2016   IR EMBO ARTERIAL NOT HEMORR HEMANG INC GUIDE ROADMAPPING 07/24/2016 Jacqulynn Cadet, MD [facility]  . IR RADIOLOGIST EVAL & MGMT  08/27/2016  . IR RADIOLOGIST EVAL & MGMT  02/09/2017  . JOINT REPLACEMENT  2005   Right knee  . LEFT HEART CATHETERIZATION WITH CORONARY ANGIOGRAM N/A 04/10/2013   Procedure: LEFT HEART CATHETERIZATION WITH CORONARY ANGIOGRAM;  Surgeon: Burnell Blanks, MD;  Location: [facility] C[facility] LAB;  Service: Cardiovascular;  Laterality: N/A;  . PTCA    . ROTATOR CUFF REPAIR Left   . SPINE SURGERY     X's 3  . TOTAL KNEE ARTHROPLASTY     right    Current Outpatient Medications  Medication Sig Dispense Refill  . aspirin EC 325 MG tablet Take 1 tablet (325 mg total) by mouth daily. 30 tablet 0  . busPIRone (BUSPAR) 10 MG tablet Take 10 mg by mouth 2 (two) times daily as  needed (anxiety).     . carvedilol (COREG) 6.25 MG tablet Take 3.125 mg by mouth 2 (two) times daily with a meal.     . finasteride (PROSCAR) 5 MG tablet Take 5 mg by mouth daily.      . isosorbide mononitrate (IMDUR) 30 MG 24 hr tablet Take 1 tablet (30 mg total) by mouth daily. 90 tablet 3  . lisinopril (PRINIVIL,ZESTRIL) 40 MG tablet Take 20 mg by mouth 2 (two) times daily.     . meclizine (ANTIVERT) 12.5 MG tablet Take 12.5 mg by mouth 3 (three) times daily as needed for dizziness.    . nitroGLYCERIN (NITROSTAT) 0.4 MG SL tablet Place 0.4 mg under the tongue  every 5 (five) minutes as needed (MAX 3 TABLETS). For chest pain    . pantoprazole (PROTONIX) 40 MG tablet Take 40 mg by mouth every evening.     . polyethylene glycol (MIRALAX / GLYCOLAX) packet Take 17 g by mouth daily as needed for mild constipation.    . simvastatin (ZOCOR) 80 MG tablet Take 40 mg by mouth at bedtime.     . traMADol (ULTRAM) 50 MG tablet Take 50-100 mg by mouth.    . zolpidem (AMBIEN) 10 MG tablet Take 10 mg by mouth at bedtime. For insomnia     No current facility-administered medications for this visit.     Allergies  Allergen Reactions  . Alum & Mag Hydroxide-Simeth Other (See Comments)    Unknown (M[place])  . Oxycodone-Acetaminophen Other (See Comments)    unknown    Social History   Socioeconomic History  . Marital status: Married    Spouse name: Not on file  . Number of children: Not on file  . Years of education: Not on file  . Highest education level: Not on file  Social Needs  . Financial resource strain: Not on file  . Food insecurity - worry: Not on file  . Food insecurity - inability: Not on file  . Transportation needs - medical: Not on file  . Transportation needs - non-medical: Not on file  Occupational History  . Not on file  Tobacco Use  . Smoking status: Never Smoker  . Smokeless tobacco: Never Used  Substance and Sexual Activity  . Alcohol use: No    Alcohol/week: 0.0 oz  .  Drug use: No  . Sexual activity: Not on file  Other Topics Concern  . Not on file  Social History Narrative  . Not on file    Family History  Problem Relation Age of Onset  . Coronary artery disease Mother   . Diabetes Mother   . Heart disease Mother   . Hyperlipidemia Mother   . Hypertension Mother   . Heart attack Mother   . Heart disease Father   . Hyperlipidemia Father   . Hypertension Father   . Heart attack Father   . Stroke Neg Hx     Review of Systems:  As stated in the HPI and otherwise negative.   BP (!) 180/96   Pulse 75   Ht 6\' 2"  (1.88 m)   Wt 196 lb 12.8 oz (89.3 kg)   SpO2 96%   BMI 25.27 kg/m   Physical Examination:  General: Well developed, well nourished, NAD  HEENT: OP clear, mucus membranes moist  SKIN: warm, dry. No rashes. Neuro: No focal deficits  Musculoskeletal: Muscle strength 5/5 all ext  Psychiatric: Mood and affect normal  Neck: No JVD, no carotid bruits, no thyromegaly, no lymphadenopathy.  Lungs:Clear bilaterally, no wheezes, rhonci, crackles Cardiovascular: Regular rate and rhythm. No murmurs, gallops or rubs. Abdomen:Soft. Bowel sounds present. Non-tender.  Extremities: No lower extremity edema. Pulses are 2 + in the bilateral DP/PT.   Cardiac cath 04/10/13: Left main: 99% distal stenosis.  Left Anterior Descending Artery: Large caliber vessel that courses to the apex. The vessel is occluded at the ostium. The entire proximal, mid and distal vessel including the diagonal fills from the patent IMA graft.  Circumflex Artery: Large caliber, diffusely calcified vessel. 99% ostial stenosis. 40% proximal stenosis. First obtuse marginal branch is moderate in caliber with a proximal 30% stenosis. The distal AV groove Circumflex has a long 99% stenosis just before  the takeoff of the second obtuse marginal branch. The distal AV groove Circumflex beyond this becomes small in caliber and has diffuse 50% stensosis.  Right Coronary Artery: 100%  proximal occlusion. The mid and distal vessel fills from the patent free radial graft.  Graft Anatomy:  Both SVG to OM system are occluded  Free radial graft to PDA is patent  LIMA graft to mid LAD is patent  Left Ventricular Angiogram: LVEF=35%. Global hypokinesis.  Impression:  1. Severe triple vessel CAD s/p 3V CABG with 2/3 patent bypass grafts.  2. Occluded vein graft to OM system with high grade disease in distal left main leading into large OM system and severe stenosis distal Circumflex.  3. Successful PTCA/DES x 1 distal Left main artery  4. Successful PTCA/DES x 1 distal left Circumflex artery  5. Moderate LV systolic dysfunction  Echo 03/18/17: - Left ventricle: Abnormal septal motion The cavity size was   normal. Wall thickness was increased in a pattern of severe LVH.   Systolic function was normal. The estimated ejection fraction was   in the range of 50% to 55%. - Mitral valve: Calcified annulus. Mildly thickened leaflets .   There was mild regurgitation. - Left atrium: The atrium was moderately dilated. - Atrial septum: No defect or patent foramen ovale was identified.  EKG:  EKG is not ordered today. The ekg ordered today demonstrates   Recent Labs: 03/11/2017: NT-Pro BNP 836 06/11/2017: Creatinine, Ser 1.07; Hemoglobin 14.0; Platelets 160; Potassium 4.0; Sodium 141 06/15/2017: BUN 17; TSH 3.650   Lipid Panel    Component Value Date/Time   CHOL 130 06/15/2017 1042   TRIG 168 (H) 06/15/2017 1042   HDL 33 (L) 06/15/2017 1042   CHOLHDL 3.9 06/15/2017 1042   LDLCALC 63 06/15/2017 1042     Wt Readings from Last 3 Encounters:  06/23/17 196 lb 12.8 oz (89.3 kg)  06/15/17 196 lb 6.4 oz (89.1 kg)  06/15/17 195 lb 6.4 oz (88.6 kg)     Other studies Reviewed: Additional studies/ records that were reviewed today include: . Review of the above records demonstrates:    Assessment and Plan:   1. CAD with unstable angina: He is s/p CABG. Only grafts open in 2014  were the LIMA to the LAD and a graft to the PDA. The grafts to the circumflex system were occluded. PCI of the circumflex and left main was performed with drug eluting stent placement. Now with worsened dyspnea and chest pain. No improvement with Lasix.  Will plan right and left heart cath at [facility] 07/02/17 with possible PCI. He will alert [place] of MRI results before cath. If evidence of a stroke, will delay cath. Pre-cath labs today.   I have reviewed the risks, indications, and alternatives to cardiac catheterization, possible angioplasty, and stenting with the patient. Risks include but are not limited to bleeding, infection, vascular injury, stroke, myocardial infection, arrhythmia, kidney injury, radiation-related injury in the case of prolonged fluoroscopy use, emergency cardiac surgery, and death. The patient understands the risks of serious complication is 1-2 in 4270 with diagnostic cardiac cath and 1-2% or less with angioplasty/stenting.   2. PACs, PVCs: He has rare palpitations. Prior EKG shows sinus with PACs (reviewed with EP last visit). Will continue beta blocker. .   3. Aortic valve insufficiency: Trivial by echo November 2018  4. Ischemic CM: Last LVEF=50-55% by echo 03/18/17. Will continue current medical therapy with beta blocker and Ace-inh.     5. Chronic Systolic CHF:  Weight is stable today. No improvement with lasix. He is not volume overloaded. Lasix has been stopped.   6. Hypertension: BP is elevated today.  He reports good control at home. No changes.   7. Hyperlipidemia: continue statin. Lipids followed in primary care.   8. AAA: Followed by [facility] and now s/p aortic stent graft. He has been seen by IR for endoleak. This is felt to be stable.   9. Mitral Regurgitation: Mild by echo 03/18/17. Will follow   Current medicines are reviewed at length with the patient today.  The patient does not have concerns regarding medicines.  The following changes have been made:  no  change  Labs/ tests ordered today include:   No orders of the defined types were placed in this encounter.    Disposition:   FU with me in 4-6  weeks.      Signed, Lauree Chandler, MD 06/23/2017 1:45 PM    [facility] [address], [place], [place]  [address] Phone: [phone]; Fax: [phone]

## 2017-06-23 NOTE — Patient Instructions (Signed)
Medication Instructions:  Your physician recommends that you continue on your current medications as directed. Please refer to the Current Medication list given to you today.   Labwork: Lab work to be done today--BMP, CBC, PT  Testing/Procedures: Your physician has requested that you have a cardiac catheterization. Cardiac catheterization is used to diagnose and/or treat various heart conditions. Doctors may recommend this procedure for a number of different reasons. The most common reason is to evaluate chest pain. Chest pain can be a symptom of coronary artery disease (CAD), and cardiac catheterization can show whether plaque is narrowing or blocking your heart's arteries. This procedure is also used to evaluate the valves, as well as measure the blood flow and oxygen levels in different parts of your heart. For further information please visit [email or web address]. Please follow instruction sheet, as given.  Scheduled for Feb 15,2019  Follow-Up: Your physician recommends that you schedule a follow-up appointment in: one month with PA or NP    Any Other Special Instructions Will Be Listed Below (If Applicable).     [place] [facility] [address] [place] [address] Dept: [phone] Loc: [phone]  Jonathan Arroyo  06/23/2017  You are scheduled for a Cardiac Catheterization on Friday, February 15 with Dr. Lauree Chandler.  1. Please arrive at the [facility] (Main Entrance A) at [facility]: [address] [place], [place] [address] at 8:00 AM (two hours before your procedure to ensure your preparation). Free valet parking service is available.   Special note: Every effort is made to have your procedure done on time. Please understand that emergencies sometimes delay scheduled procedures.  2. Diet: Do not eat or drink anything after midnight prior to your procedure except  sips of water to take medications.  3. Labs: Lab work was done in office on 06/23/17  4. Medication instructions in preparation for your procedure:   On the morning of your procedure, take your Aspirin and any morning medicines NOT listed above.  You may use sips of water.  5. Plan for one night stay--bring personal belongings. 6. Bring a current list of your medications and current insurance cards. 7. You MUST have a responsible person to drive you home. 8. Someone MUST be with you the first 24 hours after you arrive home or your discharge will be delayed. 9. Please wear clothes that are easy to get on and off and wear slip-on shoes.  Thank you for allowing [place] to care for you!   -- [facility]   If you need a refill on your cardiac medications before your next appointment, please call your pharmacy.

## 2017-06-23 NOTE — Progress Notes (Signed)
Chief Complaint  Patient presents with  . Coronary Artery Disease   History of Present Illness: 84 yo male with history of HLD, HTN, GERD, CAD s/p CABG (1994 redo in 2005), ischemic cardiomyopathy, MR, hiatal hernia, DVT who is here today for cardiac follow up. He has been followed in the past by Dr. Lia Foyer. Redo CABG 07/04/03 with right radial graft to PDA, SVG to Circumflex. His LIMA to LAD was patent by cath. His AAA is followed by Dr. Oneida Alar in [facility]. He was admitted November 2014 with chest pain c/w angina. Cardiac cath 04/10/13 and found to have patent LIMA to LAD, patent free radial graft to PDA but occluded SVG to Circumflex system with 99% distal left main stenosis supplying the Circumflex as well as severe distal Circumflex stenosis. A 2.75 x 20 mm Promus Premier DES was deployed in the distal Circumflex extending into the second OM branch. A 3.0 x 16 mm Promus Premier DES was deployed in the distal left main extending into the proximal Circumflex.  Aortic stent graft placed October 2015 by Dr. Oneida Alar. He was seen in our office March 2017 by Ellen Henri, PA-C and had weight gain and dyspnea which responded to Lasix. Echo April 2017 with LVEF=45-50%, moderate MR. He was diagnosed with a PE in December 2017 and is now on Xarelto. He was seen in our office 03/02/17 by Lyda Jester, PA-C and had c/o worsened dyspnea with some chest pain. Workup in primary care with elevated d-dimer but CTA chest negative for PE. Nuclear stress test 03/18/17 with inferior, inferolateral and apical scar but no ischemia. I saw him back in the office 03/19/17 and he was feeling well. He did not wish to pursue a repeat cardiac cath. Last visit with me 05/27/17 and he c/o dyspnea. He refused cath. Lasix was started. He was seen in our office 06/11/17 by Melina Copa, PA and there was concern for a stroke given new visual changes. He refused admission. He saw neurology who felt that he had a TIA. His ASA was increased to  325 mg daily. Pt refused to take Plavix due to cost. He was seen again 06/15/17 in our office by Estella Husk, PA and c/o ongoing dyspnea with exertional chest pain. Imdur was added. Plan was to proceed with cath if head MRI did not show a stroke.   He is here today for follow up. He continues to have dyspnea with minimal exertion and mild chest pressures. No LE edema. Some dizziness. MRI head Monday but no report yet.     Primary Care Physician: Raina Mina., MD  Past Medical History:  Diagnosis Date  . Abdominal aneurysm without mention of rupture    a. s/p stent graft 2015 by [facility].  . Acute venous embolism and thrombosis of unspecified deep vessels of lower extremity   . Anxiety   . CAD (coronary artery disease)    a. s/p CABG 1994, redo 2005. b. [place] 03/2013: 2/3 patent bpg. s/p PTCA/DES to distal LM and PTCA/DES to distal LCx.  . Chronic combined systolic and diastolic CHF (congestive heart failure) ([place])   . Esophageal reflux   . Hiatal hernia    HISTORY  . HTN (hypertension)   . Hyperlipidemia   . Ischemic cardiomyopathy    a. EF history: 2011: 35-40%, 11/2012: 45-50%. b. 03/2013: 35% by cath.  . Mild mitral regurgitation   . Myocardial infarction ([place])   . Other specified gastritis without mention of hemorrhage   .  Premature atrial contractions   . Pulmonary embolism ([place]) 04/2016  . PVC's (premature ventricular contractions)   . Renal calculus    history  . Spinal stenosis, unspecified region other than cervical   . Stomach pain June 2013   Hospital stay at [facility]    Past Surgical History:  Procedure Laterality Date  . ABDOMINAL AORTIC ANEURYSM REPAIR    . ABDOMINAL AORTIC ENDOVASCULAR STENT GRAFT N/A 03/14/2014   Procedure: ABDOMINAL AORTIC ENDOVASCULAR STENT GRAFT;  Surgeon: Elam Dutch, MD;  Location: [place];  Service: Vascular;  Laterality: N/A;  . BACK SURGERY    . CORONARY ANGIOPLASTY WITH STENT PLACEMENT  04/10/2013   DES LEFT MAIN DES LEFT  CIRCUMFLEX    DR [place]  . CORONARY ARTERY BYPASS GRAFT  1994 and  2005  . HIATAL HERNIA REPAIR    . IR GENERIC HISTORICAL  07/01/2016   IR RADIOLOGIST EVAL & MGMT 07/01/2016 Jacqulynn Cadet, MD [facility] INTERV RAD  . IR GENERIC HISTORICAL  07/24/2016   IR ANGIOGRAM SELECTIVE EACH ADDITIONAL VESSEL 07/24/2016 Jacqulynn Cadet, MD [facility]  . IR GENERIC HISTORICAL  07/24/2016   IR ANGIOGRAM SELECTIVE EACH ADDITIONAL VESSEL 07/24/2016 Jacqulynn Cadet, MD [facility]  . IR GENERIC HISTORICAL  07/24/2016   IR ANGIOGRAM VISCERAL SELECTIVE 07/24/2016 Jacqulynn Cadet, MD [facility]  . IR GENERIC HISTORICAL  07/24/2016   IR US GUIDE VASC ACCESS RIGHT 07/24/2016 Jacqulynn Cadet, MD [facility]  . IR GENERIC HISTORICAL  07/24/2016   IR ANGIOGRAM SELECTIVE EACH ADDITIONAL VESSEL 07/24/2016 Jacqulynn Cadet, MD [facility]  . IR GENERIC HISTORICAL  07/24/2016   IR ANGIOGRAM PELVIS SELECTIVE OR SUPRASELECTIVE 07/24/2016 Jacqulynn Cadet, MD [facility]  . IR GENERIC HISTORICAL  07/24/2016   IR ANGIOGRAM SELECTIVE EACH ADDITIONAL VESSEL 07/24/2016 Jacqulynn Cadet, MD [facility]  . IR GENERIC HISTORICAL  07/24/2016   IR EMBO ARTERIAL NOT HEMORR HEMANG INC GUIDE ROADMAPPING 07/24/2016 Jacqulynn Cadet, MD [facility]  . IR RADIOLOGIST EVAL & MGMT  08/27/2016  . IR RADIOLOGIST EVAL & MGMT  02/09/2017  . JOINT REPLACEMENT  2005   Right knee  . LEFT HEART CATHETERIZATION WITH CORONARY ANGIOGRAM N/A 04/10/2013   Procedure: LEFT HEART CATHETERIZATION WITH CORONARY ANGIOGRAM;  Surgeon: Burnell Blanks, MD;  Location: [facility] CATH LAB;  Service: Cardiovascular;  Laterality: N/A;  . PTCA    . ROTATOR CUFF REPAIR Left   . SPINE SURGERY     X's 3  . TOTAL KNEE ARTHROPLASTY     right    Current Outpatient Medications  Medication Sig Dispense Refill  . aspirin EC 325 MG tablet Take 1 tablet (325 mg total) by mouth daily. 30 tablet 0  . busPIRone (BUSPAR) 10 MG tablet Take 10 mg by mouth 2 (two) times daily as  needed (anxiety).     . carvedilol (COREG) 6.25 MG tablet Take 3.125 mg by mouth 2 (two) times daily with a meal.     . finasteride (PROSCAR) 5 MG tablet Take 5 mg by mouth daily.      . isosorbide mononitrate (IMDUR) 30 MG 24 hr tablet Take 1 tablet (30 mg total) by mouth daily. 90 tablet 3  . lisinopril (PRINIVIL,ZESTRIL) 40 MG tablet Take 20 mg by mouth 2 (two) times daily.     . meclizine (ANTIVERT) 12.5 MG tablet Take 12.5 mg by mouth 3 (three) times daily as needed for dizziness.    . nitroGLYCERIN (NITROSTAT) 0.4 MG SL tablet Place 0.4 mg under the tongue  every 5 (five) minutes as needed (MAX 3 TABLETS). For chest pain    . pantoprazole (PROTONIX) 40 MG tablet Take 40 mg by mouth every evening.     . polyethylene glycol (MIRALAX / GLYCOLAX) packet Take 17 g by mouth daily as needed for mild constipation.    . simvastatin (ZOCOR) 80 MG tablet Take 40 mg by mouth at bedtime.     . traMADol (ULTRAM) 50 MG tablet Take 50-100 mg by mouth.    . zolpidem (AMBIEN) 10 MG tablet Take 10 mg by mouth at bedtime. For insomnia     No current facility-administered medications for this visit.     Allergies  Allergen Reactions  . Alum & Mag Hydroxide-Simeth Other (See Comments)    Unknown (Mylanta)  . Oxycodone-Acetaminophen Other (See Comments)    unknown    Social History   Socioeconomic History  . Marital status: Married    Spouse name: Not on file  . Number of children: Not on file  . Years of education: Not on file  . Highest education level: Not on file  Social Needs  . Financial resource strain: Not on file  . Food insecurity - worry: Not on file  . Food insecurity - inability: Not on file  . Transportation needs - medical: Not on file  . Transportation needs - non-medical: Not on file  Occupational History  . Not on file  Tobacco Use  . Smoking status: Never Smoker  . Smokeless tobacco: Never Used  Substance and Sexual Activity  . Alcohol use: No    Alcohol/week: 0.0 oz  .  Drug use: No  . Sexual activity: Not on file  Other Topics Concern  . Not on file  Social History Narrative  . Not on file    Family History  Problem Relation Age of Onset  . Coronary artery disease Mother   . Diabetes Mother   . Heart disease Mother   . Hyperlipidemia Mother   . Hypertension Mother   . Heart attack Mother   . Heart disease Father   . Hyperlipidemia Father   . Hypertension Father   . Heart attack Father   . Stroke Neg Hx     Review of Systems:  As stated in the HPI and otherwise negative.   BP (!) 180/96   Pulse 75   Ht 6\' 2"  (1.88 m)   Wt 196 lb 12.8 oz (89.3 kg)   SpO2 96%   BMI 25.27 kg/m   Physical Examination:  General: Well developed, well nourished, NAD  HEENT: OP clear, mucus membranes moist  SKIN: warm, dry. No rashes. Neuro: No focal deficits  Musculoskeletal: Muscle strength 5/5 all ext  Psychiatric: Mood and affect normal  Neck: No JVD, no carotid bruits, no thyromegaly, no lymphadenopathy.  Lungs:Clear bilaterally, no wheezes, rhonci, crackles Cardiovascular: Regular rate and rhythm. No murmurs, gallops or rubs. Abdomen:Soft. Bowel sounds present. Non-tender.  Extremities: No lower extremity edema. Pulses are 2 + in the bilateral DP/PT.   Cardiac cath 04/10/13: Left main: 99% distal stenosis.  Left Anterior Descending Artery: Large caliber vessel that courses to the apex. The vessel is occluded at the ostium. The entire proximal, mid and distal vessel including the diagonal fills from the patent IMA graft.  Circumflex Artery: Large caliber, diffusely calcified vessel. 99% ostial stenosis. 40% proximal stenosis. First obtuse marginal branch is moderate in caliber with a proximal 30% stenosis. The distal AV groove Circumflex has a long 99% stenosis just before  the takeoff of the second obtuse marginal branch. The distal AV groove Circumflex beyond this becomes small in caliber and has diffuse 50% stensosis.  Right Coronary Artery: 100%  proximal occlusion. The mid and distal vessel fills from the patent free radial graft.  Graft Anatomy:  Both SVG to OM system are occluded  Free radial graft to PDA is patent  LIMA graft to mid LAD is patent  Left Ventricular Angiogram: LVEF=35%. Global hypokinesis.  Impression:  1. Severe triple vessel CAD s/p 3V CABG with 2/3 patent bypass grafts.  2. Occluded vein graft to OM system with high grade disease in distal left main leading into large OM system and severe stenosis distal Circumflex.  3. Successful PTCA/DES x 1 distal Left main artery  4. Successful PTCA/DES x 1 distal left Circumflex artery  5. Moderate LV systolic dysfunction  Echo 03/18/17: - Left ventricle: Abnormal septal motion The cavity size was   normal. Wall thickness was increased in a pattern of severe LVH.   Systolic function was normal. The estimated ejection fraction was   in the range of 50% to 55%. - Mitral valve: Calcified annulus. Mildly thickened leaflets .   There was mild regurgitation. - Left atrium: The atrium was moderately dilated. - Atrial septum: No defect or patent foramen ovale was identified.  EKG:  EKG is not ordered today. The ekg ordered today demonstrates   Recent Labs: 03/11/2017: NT-Pro BNP 836 06/11/2017: Creatinine, Ser 1.07; Hemoglobin 14.0; Platelets 160; Potassium 4.0; Sodium 141 06/15/2017: BUN 17; TSH 3.650   Lipid Panel    Component Value Date/Time   CHOL 130 06/15/2017 1042   TRIG 168 (H) 06/15/2017 1042   HDL 33 (L) 06/15/2017 1042   CHOLHDL 3.9 06/15/2017 1042   LDLCALC 63 06/15/2017 1042     Wt Readings from Last 3 Encounters:  06/23/17 196 lb 12.8 oz (89.3 kg)  06/15/17 196 lb 6.4 oz (89.1 kg)  06/15/17 195 lb 6.4 oz (88.6 kg)     Other studies Reviewed: Additional studies/ records that were reviewed today include: . Review of the above records demonstrates:    Assessment and Plan:   1. CAD with unstable angina: He is s/p CABG. Only grafts open in 2014  were the LIMA to the LAD and a graft to the PDA. The grafts to the circumflex system were occluded. PCI of the circumflex and left main was performed with drug eluting stent placement. Now with worsened dyspnea and chest pain. No improvement with Lasix.  Will plan right and left heart cath at [facility] 07/02/17 with possible PCI. He will alert [place] of MRI results before cath. If evidence of a stroke, will delay cath. Pre-cath labs today.   I have reviewed the risks, indications, and alternatives to cardiac catheterization, possible angioplasty, and stenting with the patient. Risks include but are not limited to bleeding, infection, vascular injury, stroke, myocardial infection, arrhythmia, kidney injury, radiation-related injury in the case of prolonged fluoroscopy use, emergency cardiac surgery, and death. The patient understands the risks of serious complication is 1-2 in 0102 with diagnostic cardiac cath and 1-2% or less with angioplasty/stenting.   2. PACs, PVCs: He has rare palpitations. Prior EKG shows sinus with PACs (reviewed with EP last visit). Will continue beta blocker. .   3. Aortic valve insufficiency: Trivial by echo November 2018  4. Ischemic CM: Last LVEF=50-55% by echo 03/18/17. Will continue current medical therapy with beta blocker and Ace-inh.     5. Chronic Systolic CHF:  Weight is stable today. No improvement with lasix. He is not volume overloaded. Lasix has been stopped.   6. Hypertension: BP is elevated today.  He reports good control at home. No changes.   7. Hyperlipidemia: continue statin. Lipids followed in primary care.   8. AAA: Followed by [facility] and now s/p aortic stent graft. He has been seen by IR for endoleak. This is felt to be stable.   9. Mitral Regurgitation: Mild by echo 03/18/17. Will follow   Current medicines are reviewed at length with the patient today.  The patient does not have concerns regarding medicines.  The following changes have been made:  no  change  Labs/ tests ordered today include:   No orders of the defined types were placed in this encounter.    Disposition:   FU with me in 4-6  weeks.      Signed, Lauree Chandler, MD 06/23/2017 1:45 PM    [facility] [address], [place], [place]  [address] Phone: [phone]; Fax: [phone]

## 2017-06-24 LAB — BASIC METABOLIC PANEL
BUN / CREAT RATIO: 15 (ref 10–24)
BUN: 14 mg/dL (ref 8–27)
CHLORIDE: 101 mmol/L (ref 96–106)
CO2: 24 mmol/L (ref 20–29)
Calcium: 9.6 mg/dL (ref 8.6–10.2)
Creatinine, Ser: 0.93 mg/dL (ref 0.76–1.27)
GFR calc Af Amer: 86 mL/min/{1.73_m2} (ref >59)
GFR calc non Af Amer: 75 mL/min/{1.73_m2} (ref >59)
GLUCOSE: 76 mg/dL (ref 65–99)
POTASSIUM: 4.3 mmol/L (ref 3.5–5.2)
SODIUM: 140 mmol/L (ref 134–144)

## 2017-06-24 LAB — CBC
Hematocrit: 41.5 % (ref 37.5–51.0)
Hemoglobin: 13.9 g/dL (ref 13.0–17.7)
MCH: 32.2 pg (ref 26.6–33.0)
MCHC: 33.5 g/dL (ref 31.5–35.7)
MCV: 96 fL (ref 79–97)
Platelets: 154 10*3/uL (ref 150–379)
RBC: 4.32 x10E6/uL (ref 4.14–5.80)
RDW: 13.9 % (ref 12.3–15.4)
WBC: 6.5 10*3/uL (ref 3.4–10.8)

## 2017-06-24 LAB — PROTIME-INR
INR: 1 (ref 0.8–1.2)
Prothrombin Time: 10.8 s (ref 9.1–12.0)

## 2017-06-25 ENCOUNTER — Telehealth: Payer: Self-pay | Admitting: *Deleted

## 2017-06-25 NOTE — Telephone Encounter (Signed)
I returned the patient's call and informed him that I personally reviewed MRI films from [facility] from 06/21/17 which showed tiny punctate enhancing right occipital white matter lesion likely a subacute infarct which can explain his symptoms from this episode 2 weeks ago. He also told me he has history of A. fib in the past and was on Xarelto but was not clear as to why he was taken off it. I gave him neurological clearance to undergo his cardiac cath with Dr. Julianne Handler on 07/02/17 and recommend he be placed back on Xarelto after the cardiac cath.I sent message in Epic to [place]

## 2017-06-25 NOTE — Telephone Encounter (Signed)
MRI results from [facility] received in office. Ordered by Dr. Leonie Man.  Reviewed with Dr. Angelena Form who requests I check with Dr. Leonie Man to see if OK for pt to proceed with cath on 2/15.

## 2017-06-29 ENCOUNTER — Encounter: Payer: Medicare Other | Admitting: Neurology

## 2017-06-30 ENCOUNTER — Other Ambulatory Visit: Payer: Self-pay | Admitting: Neurology

## 2017-06-30 ENCOUNTER — Telehealth: Payer: Self-pay | Admitting: Cardiovascular Disease

## 2017-06-30 DIAGNOSIS — I639 Cerebral infarction, unspecified: Secondary | ICD-10-CM

## 2017-06-30 NOTE — Telephone Encounter (Signed)
Jonathan Fila, MD  to Jonathan Grayer, RN . Jonathan Blanks, MD    06/25/17 2:13 PM  Note    I returned the patient's call and informed him that I personally reviewed MRI films from [name] from 06/21/17 which showed tiny punctate enhancing right occipital white matter lesion likely a subacute infarct which can explain his symptoms from this episode 2 weeks ago. He also told me he has history of A. fib in the past and was on Xarelto but was not clear as to why he was taken off it. I gave him neurological clearance to undergo his cardiac cath with Dr. Julianne Handler on 07/02/17 and recommend he be placed back on Xarelto after the cardiac cath.I sent message in Epic to [place]

## 2017-06-30 NOTE — Telephone Encounter (Signed)
Pts wife calling requesting a call from Dr. Leonie Man to go over the result of pts MRI. Dr. Leonie Man had called the pt to go over everything but pt didn't fully understand. Please contact Gwendolyn at [phone]

## 2017-06-30 NOTE — Telephone Encounter (Signed)
Jonathan Arroyo, We will need to refer [name] to EP to discuss implanting a loop recorder per request of Dr. Leonie Man.   Thanks, chris

## 2017-06-30 NOTE — Telephone Encounter (Addendum)
Rn call patients wife about her husband MRI. PTs wife she answer the phone and it was DR. Sethi. The wife stated her husband said something about a spot on his brain, and that he did have a stroke. Message will be sent to [place].

## 2017-06-30 NOTE — Telephone Encounter (Signed)
Per staff message from Dr. Leonie Man, pt with recent stroke. He does not recommend Xarelto or Eliquis at this time as no atrial fib documented. He is asking for [place] to arrange a loop recorder.   Lauree Chandler

## 2017-06-30 NOTE — Telephone Encounter (Signed)
I called and spoke to the patient's wife who clarified to me that the patient has not had documented A. fib. He is had history of irregular heartbeat for years and was previously evaluated by Dr. Lia Foyer and specifically told that if it was not found. I clarified the MRI report with the patient's wife stating that the right occipital lesion was likely a subacute infarct rather than a solitary metastasis. I recommend patient undergo loop recorder insertion to evaluate for paroxysmal A. fib and there is no immediate need for the patient to start Xarelto. I will communicate this to Dr. Julianne Handler. She voiced understanding

## 2017-07-01 ENCOUNTER — Telehealth: Payer: Self-pay | Admitting: *Deleted

## 2017-07-01 NOTE — Telephone Encounter (Signed)
Pt contacted pre-catheterization scheduled at [facility] for: Friday July 02, 2017 10:30 AM Verified arrival time and place: [place] [facility]/[place] at: 8 AM Verified allergies listed in Epic. Verified no diabetes medications.  Verified AM meds can be  taken pre-cath with sip of water including: ASA 81 mg am of cath  Confirmed patient has responsible person to drive home post procedure and observe patient for 24 hours: yes

## 2017-07-02 ENCOUNTER — Encounter (HOSPITAL_COMMUNITY)
Admission: RE | Disposition: A | Discharge status: Home / Self Care | Payer: Self-pay | Source: Ambulatory Visit | Attending: Cardiovascular Disease

## 2017-07-02 ENCOUNTER — Ambulatory Visit (HOSPITAL_COMMUNITY)
Admission: RE | Admit: 2017-07-02 | Discharge: 2017-07-02 | Disposition: A | Discharge status: Home / Self Care | Payer: Medicare Other | Source: Ambulatory Visit | Attending: Cardiovascular Disease | Admitting: Cardiovascular Disease

## 2017-07-02 DIAGNOSIS — I252 Old myocardial infarction: Secondary | ICD-10-CM | POA: Insufficient documentation

## 2017-07-02 DIAGNOSIS — K219 Gastro-esophageal reflux disease without esophagitis: Secondary | ICD-10-CM | POA: Insufficient documentation

## 2017-07-02 DIAGNOSIS — Z86711 Personal history of pulmonary embolism: Secondary | ICD-10-CM | POA: Insufficient documentation

## 2017-07-02 DIAGNOSIS — Z8249 Family history of ischemic heart disease and other diseases of the circulatory system: Secondary | ICD-10-CM | POA: Diagnosis not present

## 2017-07-02 DIAGNOSIS — I255 Ischemic cardiomyopathy: Secondary | ICD-10-CM | POA: Insufficient documentation

## 2017-07-02 DIAGNOSIS — I110 Hypertensive heart disease with heart failure: Secondary | ICD-10-CM | POA: Insufficient documentation

## 2017-07-02 DIAGNOSIS — Z86718 Personal history of other venous thrombosis and embolism: Secondary | ICD-10-CM | POA: Insufficient documentation

## 2017-07-02 DIAGNOSIS — F419 Anxiety disorder, unspecified: Secondary | ICD-10-CM | POA: Diagnosis not present

## 2017-07-02 DIAGNOSIS — Z7982 Long term (current) use of aspirin: Secondary | ICD-10-CM | POA: Insufficient documentation

## 2017-07-02 DIAGNOSIS — I714 Abdominal aortic aneurysm, without rupture: Secondary | ICD-10-CM | POA: Diagnosis not present

## 2017-07-02 DIAGNOSIS — I25710 Atherosclerosis of autologous vein coronary artery bypass graft(s) with unstable angina pectoris: Secondary | ICD-10-CM | POA: Insufficient documentation

## 2017-07-02 DIAGNOSIS — I493 Ventricular premature depolarization: Secondary | ICD-10-CM | POA: Insufficient documentation

## 2017-07-02 DIAGNOSIS — Z7901 Long term (current) use of anticoagulants: Secondary | ICD-10-CM | POA: Insufficient documentation

## 2017-07-02 DIAGNOSIS — E785 Hyperlipidemia, unspecified: Secondary | ICD-10-CM | POA: Diagnosis not present

## 2017-07-02 DIAGNOSIS — I2582 Chronic total occlusion of coronary artery: Secondary | ICD-10-CM | POA: Diagnosis not present

## 2017-07-02 DIAGNOSIS — I25110 Atherosclerotic heart disease of native coronary artery with unstable angina pectoris: Secondary | ICD-10-CM

## 2017-07-02 DIAGNOSIS — R0602 Shortness of breath: Secondary | ICD-10-CM | POA: Diagnosis not present

## 2017-07-02 DIAGNOSIS — I491 Atrial premature depolarization: Secondary | ICD-10-CM | POA: Diagnosis not present

## 2017-07-02 DIAGNOSIS — I080 Rheumatic disorders of both mitral and aortic valves: Secondary | ICD-10-CM | POA: Insufficient documentation

## 2017-07-02 DIAGNOSIS — I5042 Chronic combined systolic (congestive) and diastolic (congestive) heart failure: Secondary | ICD-10-CM | POA: Insufficient documentation

## 2017-07-02 HISTORY — PX: RIGHT/LEFT HEART CATH AND CORONARY/GRAFT ANGIOGRAPHY: CATH118267

## 2017-07-02 LAB — POCT I-STAT 3, VENOUS BLOOD GAS (G3P V)
Acid-base deficit: 1 mmol/L (ref 0.0–2.0)
Bicarbonate: 25.2 mmol/L (ref 20.0–28.0)
O2 SAT: 69 %
PCO2 VEN: 48 mmHg (ref 44.0–60.0)
PH VEN: 7.329 (ref 7.250–7.430)
PO2 VEN: 39 mmHg (ref 32.0–45.0)
TCO2: 27 mmol/L (ref 22–32)

## 2017-07-02 LAB — POCT I-STAT 3, ART BLOOD GAS (G3+)
Acid-base deficit: 1 mmol/L (ref 0.0–2.0)
BICARBONATE: 24.2 mmol/L (ref 20.0–28.0)
O2 Saturation: 99 %
PCO2 ART: 43.2 mmHg (ref 32.0–48.0)
PH ART: 7.355 (ref 7.350–7.450)
TCO2: 25 mmol/L (ref 22–32)
pO2, Arterial: 159 mmHg — ABNORMAL HIGH (ref 83.0–108.0)

## 2017-07-02 SURGERY — RIGHT/LEFT HEART CATH AND CORONARY/GRAFT ANGIOGRAPHY
Anesthesia: LOCAL

## 2017-07-02 MED ORDER — SODIUM CHLORIDE 0.9 % IV SOLN: INTRAVENOUS | Status: DC

## 2017-07-02 MED ORDER — SODIUM CHLORIDE 0.9% FLUSH: 3.0000 mL | INTRAVENOUS | Status: DC | PRN

## 2017-07-02 MED ORDER — HEPARIN (PORCINE) IN NACL 2-0.9 UNIT/ML-% IJ SOLN
INTRAMUSCULAR | Status: AC | PRN
  Administered 2017-07-02 (×2): 500 mL

## 2017-07-02 MED ORDER — SODIUM CHLORIDE 0.9% FLUSH: 3.0000 mL | Freq: Two times a day (BID) | INTRAVENOUS | Status: DC

## 2017-07-02 MED ORDER — SODIUM CHLORIDE 0.9 % IV SOLN: 250.0000 mL | INTRAVENOUS | Status: DC | PRN

## 2017-07-02 MED ORDER — IOPAMIDOL (ISOVUE-370) INJECTION 76%
INTRAVENOUS | Status: DC | PRN
  Administered 2017-07-02: 33.4 mL

## 2017-07-02 MED ORDER — FENTANYL CITRATE (PF) 100 MCG/2ML IJ SOLN
INTRAMUSCULAR | Status: AC
  Filled 2017-07-02: qty 2

## 2017-07-02 MED ORDER — HEPARIN (PORCINE) IN NACL 2-0.9 UNIT/ML-% IJ SOLN
INTRAMUSCULAR | Status: AC
  Filled 2017-07-02: qty 1000

## 2017-07-02 MED ORDER — SODIUM CHLORIDE 0.9 % IV SOLN
INTRAVENOUS | Status: DC
  Administered 2017-07-02: 08:00:00 via INTRAVENOUS

## 2017-07-02 MED ORDER — IOPAMIDOL (ISOVUE-370) INJECTION 76%
INTRAVENOUS | Status: AC
  Filled 2017-07-02: qty 100

## 2017-07-02 MED ORDER — MIDAZOLAM HCL 2 MG/2ML IJ SOLN
INTRAMUSCULAR | Status: AC
  Filled 2017-07-02: qty 2

## 2017-07-02 MED ORDER — FENTANYL CITRATE (PF) 100 MCG/2ML IJ SOLN
INTRAMUSCULAR | Status: DC | PRN
  Administered 2017-07-02 (×2): 25 ug via INTRAVENOUS

## 2017-07-02 MED ORDER — LIDOCAINE HCL 1 % IJ SOLN
INTRAMUSCULAR | Status: AC
  Filled 2017-07-02: qty 20

## 2017-07-02 MED ORDER — MIDAZOLAM HCL 2 MG/2ML IJ SOLN
INTRAMUSCULAR | Status: DC | PRN
  Administered 2017-07-02 (×2): 1 mg via INTRAVENOUS

## 2017-07-02 MED ORDER — LIDOCAINE HCL (PF) 1 % IJ SOLN
INTRAMUSCULAR | Status: DC | PRN
  Administered 2017-07-02: 15 mL

## 2017-07-02 MED ORDER — ASPIRIN 81 MG PO CHEW: 81.0000 mg | CHEWABLE_TABLET | ORAL | Status: DC

## 2017-07-02 SURGICAL SUPPLY — 16 items
CATH INFINITI 5 FR RCB (CATHETERS) ×1 IMPLANT
CATH INFINITI 5FR MULTPACK ANG (CATHETERS) ×1 IMPLANT
CATH SWAN GANZ 7F STRAIGHT (CATHETERS) ×1 IMPLANT
COVER PRB 48X5XTLSCP FOLD TPE (BAG) IMPLANT
COVER PROBE 5X48 (BAG) ×2
GUIDEWIRE INQWIRE 1.5J.035X260 (WIRE) IMPLANT
INQWIRE 1.5J .035X260CM (WIRE)
KIT HEART LEFT (KITS) ×2 IMPLANT
KIT PREMIUM HAND CONTROLLER (KITS) ×1 IMPLANT
KIT SINGLE USE MANIFOLD (KITS) ×1 IMPLANT
PACK CARDIAC CATHETERIZATION (CUSTOM PROCEDURE TRAY) ×2 IMPLANT
SHEATH PINNACLE 5F 10CM (SHEATH) ×2 IMPLANT
SHEATH PINNACLE 7F 10CM (SHEATH) ×1 IMPLANT
WIRE EMERALD 3MM-J .025X260CM (WIRE) ×1 IMPLANT
WIRE EMERALD 3MM-J .035X150CM (WIRE) ×1 IMPLANT
WIRE HI TORQ VERSACORE-J 145CM (WIRE) ×1 IMPLANT

## 2017-07-02 NOTE — Progress Notes (Signed)
Pt ambulated after bedrest, groin dsg with minimal oozing. Instructed pt to continue bedrest additional 20 min. Dsg changed. Ambulated pt again, no signs of bleeding. Pt stable and dressing for D/C.

## 2017-07-02 NOTE — Progress Notes (Signed)
62fr venous sheath aspirated and removed from rfv. Manual pressure applied for 10 minutes. 64fr arterial sheath aspirated and removed from rfa, manual pressure applied over both sites for additional 20 minutes. Site level 0 No S+S of hematoma. Tegaderm dressing applied. Bedrest instructions given.  Bilateral dp pulses palpable.  Bedrest begins at 11:15:00

## 2017-07-02 NOTE — Interval H&P Note (Signed)
History and Physical Interval Note:  07/02/2017 9:11 AM  Jonathan Arroyo  has presented today for cardiac cath with the diagnosis of unstable angina. The various methods of treatment have been discussed with the patient and family. After consideration of risks, benefits and other options for treatment, the patient has consented to  Procedure(s): LEFT HEART CATH AND CORS/GRAFTS ANGIOGRAPHY (N/A) as a surgical intervention .  The patient's history has been reviewed, patient examined, no change in status, stable for surgery.  I have reviewed the patient's chart and labs.  Questions were answered to the patient's satisfaction.    Cath Lab Visit (complete for each Cath Lab visit)  Clinical Evaluation Leading to the Procedure:   ACS: No.  Non-ACS:    Anginal Classification: CCS III  Anti-ischemic medical therapy: Maximal Therapy (2 or more classes of medications)  Non-Invasive Test Results: No non-invasive testing performed  Prior CABG: Previous CABG         Lauree Chandler

## 2017-07-02 NOTE — Discharge Instructions (Signed)

## 2017-07-05 ENCOUNTER — Encounter (HOSPITAL_COMMUNITY): Payer: Self-pay | Admitting: Cardiovascular Disease

## 2017-07-05 MED FILL — Lidocaine HCl Local Inj 1%: INTRAMUSCULAR | Qty: 20 | Status: AC

## 2017-07-05 MED FILL — Heparin Sodium (Porcine) 2 Unit/ML in Sodium Chloride 0.9%: INTRAMUSCULAR | Qty: 1000 | Status: AC

## 2017-07-07 NOTE — Telephone Encounter (Signed)
Pt has been scheduled to see Dr. [name] on 07/16/17

## 2017-07-13 ENCOUNTER — Encounter: Payer: Medicare Other | Admitting: Neurology

## 2017-07-16 ENCOUNTER — Institutional Professional Consult (permissible substitution): Payer: Medicare Other | Admitting: Cardiology

## 2017-07-29 ENCOUNTER — Ambulatory Visit: Payer: Medicare Other | Admitting: Cardiology

## 2017-08-24 ENCOUNTER — Encounter: Payer: Self-pay | Admitting: Neurology

## 2017-08-24 ENCOUNTER — Ambulatory Visit: Payer: Medicare Other | Admitting: Neurology

## 2017-08-24 VITALS — BP 170/80 | HR 72 | Ht 74.0 in | Wt 191.0 lb

## 2017-08-24 DIAGNOSIS — I639 Cerebral infarction, unspecified: Secondary | ICD-10-CM

## 2017-08-24 NOTE — Progress Notes (Signed)
[facility] [address] [place]. [place] [address] [phone]       OFFICE CONSULT NOTE  Jonathan. Jonathan Arroyo Date of Birth:  05/21/1929 Medical Record Number:  [id number]   Referring MD:  Jeanann Lewandowsky, PA-C Reason for Referral:  TIA HPI: Initial Consult 06/15/2017 :Jonathan Arroyo is a pleasant 84 year old Caucasian male who is accompanied today by Jonathan Arroyo wife. Jonathan Arroyo is referred for evaluation for a possible TIA episode. Jonathan Arroyo states Jonathan Arroyo was sitting on 06/09/16 after dinner when Jonathan Arroyo all of a sudden noticed Jonathan Arroyo was dizzy. Jonathan Arroyo felt like Jonathan Arroyo would about to pass out. Jonathan Arroyo stooped over and when Jonathan Arroyo looked up Jonathan Arroyo felt off balance. Jonathan Arroyo denied vertigo or headache. But Jonathan Arroyo noticed that Jonathan Arroyo could not see from the left half of Jonathan Arroyo vision. If Jonathan Arroyo were to read words hecould see on the the part of the work on the right side but not on the left. This lasted about 10 minutes and was followed by a slight pressure sensation in the back of Jonathan Arroyo head. Jonathan Arroyo denied any trouble with balance speech or double vision. Does have history of chronic dizziness which is had for a year. Jonathan Arroyo describes this as a feeling of imbalance which may occur once or twice a day but is quite transient. Jonathan Arroyo has been taking meclizine but is not sure whether it helps him or not. Jonathan Arroyo denies true vertigo and sensation of fainting. Jonathan Arroyo's had no brain imaging studies done. Jonathan Arroyo does have chronic tinnitus in both ears and states that Jonathan Arroyo hears like Cricket sounds.Jonathan Arroyo has also noticed mild decrease hearing bilaterally. Jonathan Arroyo denies any prior history of strokes, TIA, seizure, migraine, closed head injury with loss of consciousness. Jonathan Arroyo has not had any brain imaging studies done following this episode.Jonathan Arroyo also states that Jonathan Arroyo has long-standing 8 history of gait imbalance and paresthesias in Jonathan Arroyo feet and saw Dr. Jannifer Franklin several years ago. Jonathan Arroyo was diagnosed with neuropathy and Jonathan Arroyo also has tremor in Jonathan Arroyo left hand and mild Parkinson's disease. Jonathan Arroyo was never placed on parkinsonian  medications and Jonathan Arroyo has not followed up with Dr. Jannifer Franklin. I am unable to obtain records of Jonathan Arroyo prior neurological visits today Update 08/24/2017 ; Jonathan Arroyo returns for follow-up after last visit 2 months ago. Jonathan Arroyo is accompanied by Jonathan Arroyo wife. Jonathan Arroyo states is doing well and has not had any recurrent stroke or TIA symptoms.Jonathan Arroyo in fact feels that Jonathan Arroyo noticed some difficulty with Jonathan Arroyo right eye vision is not as clear but this is gradual process. Plans to see an eye doctor to get Jonathan Arroyo vision evaluated soon.the Jonathan Arroyo had MRI scan of the brain done on 06/21/17 at [facility] which showed a punctate tiny enhancing right occipital white matter lesion which is probably a subacute infarct and could explain Jonathan Arroyo's episode of transient left-sided peripheral vision loss and dizziness which occurred in approximately 2 weeks prior. Jonathan Arroyo had cardiac cath done by Dr. Julianne Handler and was found to have chronic LAD occlusion and triple-vessel disease but Jonathan Arroyo stent was patent and no intervention was necessary. Jonathan Arroyo states Jonathan Arroyo is tolerating aspirin well without bleeding or bruising. Jonathan Arroyo remains on Zocor denies any muscle aches or pains. Jonathan Arroyo blood pressure is usually fine at home but today it is elevated in office at 170/90. Jonathan Arroyo states Jonathan Arroyo has long-standing history of irregular heartbeat but definitely atrial fibrillation has not yet been documented.Jonathan Arroyo states Jonathan Arroyo left upper extremity tremor is stable and not bothersome. ROS:   14 system review of systems is positive for  Blurred vision, loss of vision and all other systems negative PMH:  Past Medical History:  Diagnosis Date  . Abdominal aneurysm without mention of rupture    a. s/p stent graft 2015 by [facility]  . Acute venous embolism and thrombosis of unspecified deep vessels of lower extremity   . Anxiety   . CAD (coronary artery disease)    a. s/p CABG 1994, redo 2005. b. [place] 03/2013: 2/3 patent bpg. s/p PTCA/DES to distal LM and PTCA/DES to distal LCx.  . Chronic  combined systolic and diastolic CHF (congestive heart failure) ([facility])   . Esophageal reflux   . Hiatal hernia    HISTORY  . HTN (hypertension)   . Hyperlipidemia   . Ischemic cardiomyopathy    a. EF history: 2011: 35-40%, 11/2012: 45-50%. b. 03/2013: 35% by cath.  . Mild mitral regurgitation   . Myocardial infarction ([place])   . Other specified gastritis without mention of hemorrhage   . Premature atrial contractions   . Pulmonary embolism ([place]) 04/2016  . PVC's (premature ventricular contractions)   . Renal calculus    history  . Spinal stenosis, unspecified region other than cervical   . Stomach pain June 2013   Hospital stay at [facility]    Social History:  Social History   Socioeconomic History  . Marital status: Married    Spouse name: Not on file  . Number of children: Not on file  . Years of education: Not on file  . Highest education level: Not on file  Occupational History  . Not on file  Social Needs  . Financial resource strain: Not on file  . Food insecurity:    Worry: Not on file    Inability: Not on file  . Transportation needs:    Medical: Not on file    Non-medical: Not on file  Tobacco Use  . Smoking status: Never Smoker  . Smokeless tobacco: Never Used  Substance and Sexual Activity  . Alcohol use: No    Alcohol/week: 0.0 oz  . Drug use: No  . Sexual activity: Not on file  Lifestyle  . Physical activity:    Days per week: Not on file    Minutes per session: Not on file  . Stress: Not on file  Relationships  . Social connections:    Talks on phone: Not on file    Gets together: Not on file    Attends religious service: Not on file    Active member of club or organization: Not on file    Attends meetings of clubs or organizations: Not on file    Relationship status: Not on file  . Intimate partner violence:    Fear of current or ex partner: Not on file    Emotionally abused: Not on file    Physically abused: Not on file    Forced sexual  activity: Not on file  Other Topics Concern  . Not on file  Social History Narrative  . Not on file    Medications:   Current Outpatient Medications on File Prior to Visit  Medication Sig Dispense Refill  . aspirin EC 325 MG tablet Take 1 tablet (325 mg total) by mouth daily. 30 tablet 0  . busPIRone (BUSPAR) 10 MG tablet Take 10 mg by mouth 2 (two) times daily as needed (for anxiety).     . carvedilol (COREG) 6.25 MG tablet Take 3.125 mg by mouth 2 (two) times daily with a meal.     .  finasteride (PROSCAR) 5 MG tablet Take 5 mg by mouth daily.      . furosemide (LASIX) 20 MG tablet Take 20 mg by mouth daily as needed for fluid.    Marland Kitchen lisinopril (PRINIVIL,ZESTRIL) 40 MG tablet Take 20 mg by mouth 2 (two) times daily.     . meclizine (ANTIVERT) 12.5 MG tablet Take 12.5 mg by mouth 3 (three) times daily as needed for dizziness.    . nitroGLYCERIN (NITROSTAT) 0.4 MG SL tablet Place 0.4 mg under the tongue every 5 (five) minutes as needed for chest pain (MAX 3 TABLETS).     . polyethylene glycol (MIRALAX / GLYCOLAX) packet Take 17 g by mouth daily as needed for mild constipation.    . simvastatin (ZOCOR) 40 MG tablet Take 40 mg by mouth at bedtime.     . traMADol (ULTRAM) 50 MG tablet Take 50 mg by mouth 2 (two) times daily as needed for moderate pain.     Marland Kitchen zolpidem (AMBIEN) 10 MG tablet Take 10 mg by mouth at bedtime.      No current facility-administered medications on file prior to visit.     Allergies:   Allergies  Allergen Reactions  . Alum & Mag Hydroxide-Simeth Other (See Comments)    Unknown (Mylanta)  . Oxycodone-Acetaminophen Other (See Comments)    unknown    Physical Exam General: frail elderly Caucasian male seated, in no evident distress Head: head normocephalic and atraumatic.   Neck: supple with no carotid or supraclavicular bruits Cardiovascular: regular rate and rhythm, no murmurs Musculoskeletal: no deformity Skin:  no rash/petichiae Vascular:  Normal pulses  all extremities  Neurologic Exam Mental Status: Awake and fully alert. Oriented to place and time. Recent and remote memory intact. Attention span, concentration and fund of knowledge appropriate. Mood and affect appropriate.   Cranial Nerves: Fundoscopic exam not donePupils equal, briskly reactive to light. Extraocular movements full without nystagmus. Visual fields full to confrontation. Hearing intact. Facial sensation intact. Face, tongue, palate moves normally and symmetrically.  Motor: Normal bulk and tone. Normal strength in all tested extremity muscles.ild weakness of left grip and intrinsic hand muscles. Intermittent pill-rolling tremor in the left upper extremity Mild cogwheel rigidity at the left wrist upon activation. Sensory.:diminished touch , pinprick , position and vibratory sensation in lower extremity is from calf down. Positive Romberg sign Coordination: Rapid alternating movements normal in all extremities. Finger-to-nose and heel-to-shin performed accurately bilaterally. Gait and Station: Arises from chair without difficulty. Stance is normal. Gait demonstrates normal stride length and balance . Unable to heel, toe and tandem walk   Reflexes: 1+ and symmetric except both knee and ankle jerks are depressed. Toes downgoing.       ASSESSMENT: 84 year old Caucasian male with episode of transient dizziness and left-sided peripheral vision loss likely a right brain TIA Jonathan Arroyo also has .long-standing history of lower extremity paresthesias likely due to chronic sensory polyneuropathy. Long-standing left upper extremity tremor and mild gait imbalance likely due to mild Parkinson's diseas PLAN:   I had a long discussion the Jonathan Arroyo and Jonathan Arroyo wife regarding Jonathan Arroyo subacute right occipital infarct and discussed recent MRI findings . Recommend checking transesophageal echocardiogram and loop recorder insertion for paroxysmal atrial fibrillation. Jonathan Arroyo has an upcoming appointment to see Jonathan Arroyo cardiologist  Dr. Julianne Handler and I will send him a message to arrange for the same. Continue aspirin for stroke prevention with strict control of hypertension with blood pressure goal below 130/90 and lipids with LDL cholesterol goal below 70 mg  percent. Jonathan Arroyo was also advised to eat healthy and to be active and exercise.greater than 50% time during this 25 minute visit was spent on counseling and coordination of care about Jonathan Arroyo recent occipital stroke need for further testing and answering questions She will return for follow-up in 3 months with nurse practitioner Janett Billow call earlier if necessary Antony Contras, MD  [facility] [address] [place], [place] [address]  Phone [phone] Fax [phone] Note: This document was prepared with digital dictation and possible smart phrase technology. Any transcriptional errors that result from this process are unintentional.

## 2017-08-24 NOTE — Patient Instructions (Signed)
I had a long discussion the patient and his wife regarding his subacute right occipital infarct and discussed recent MRI findings . Recommend checking transesophageal echocardiogram and loop recorder insertion for paroxysmal atrial fibrillation. He has an upcoming appointment to see his cardiologist Dr. Julianne Handler and I will send him a message to arrange for the same. Continue aspirin for stroke prevention with strict control of hypertension with blood pressure goal below 130/90 and lipids with LDL cholesterol goal below 70 mg percent. He was also advised to eat healthy and to be active and exercise. She will return for follow-up in 3 months with nurse practitioner Janett Billow call earlier if necessary

## 2017-09-01 ENCOUNTER — Telehealth: Payer: Self-pay | Admitting: *Deleted

## 2017-09-01 NOTE — Telephone Encounter (Signed)
Informed pts wife (DPR on file) that Chanetta Marshall, NP will be contacting them next week to discuss TEE w/ possible LINQ. Wife verbalized understanding and agreeable to plan.

## 2017-09-07 NOTE — Telephone Encounter (Signed)
Attempted to call patient to schedule TEE/LINQ.  No answer. Will try again tomorrow  Chanetta Marshall, NP 09/07/2017 1:19 PM

## 2017-09-10 NOTE — Telephone Encounter (Signed)
Left message for patient to call  Chanetta Marshall, NP 09/10/2017 8:46 AM

## 2017-10-22 ENCOUNTER — Telehealth: Payer: Self-pay | Admitting: Cardiovascular Disease

## 2017-10-22 ENCOUNTER — Other Ambulatory Visit: Payer: Self-pay

## 2017-10-22 NOTE — Telephone Encounter (Signed)
Spoke with patient and he recalls that it is a LINQ/TEE that may need to be scheduled.  Attempts were apparently made to get these scheduled.  Please assist, thank you

## 2017-10-22 NOTE — Telephone Encounter (Signed)
NEW MESSAGE    [employer] calling on behalf of the patient. Patient is requesting a call to schedule a procedure. Patient unsure what type of testing.  Please call Patient

## 2017-11-05 ENCOUNTER — Telehealth: Payer: Self-pay | Admitting: Internal Medicine

## 2017-11-23 ENCOUNTER — Ambulatory Visit: Payer: Medicare Other | Admitting: Adult Health

## 2017-11-24 ENCOUNTER — Encounter: Payer: Self-pay | Admitting: Adult Health

## 2017-11-24 ENCOUNTER — Ambulatory Visit: Payer: Medicare Other | Admitting: Adult Health

## 2017-11-24 VITALS — BP 131/80 | HR 76 | Ht 74.0 in | Wt 186.0 lb

## 2017-11-24 DIAGNOSIS — I10 Essential (primary) hypertension: Secondary | ICD-10-CM

## 2017-11-24 DIAGNOSIS — I639 Cerebral infarction, unspecified: Secondary | ICD-10-CM | POA: Diagnosis not present

## 2017-11-24 DIAGNOSIS — E785 Hyperlipidemia, unspecified: Secondary | ICD-10-CM | POA: Diagnosis not present

## 2017-11-24 NOTE — Patient Instructions (Addendum)
Continue aspirin 325 mg daily  and lipitor  for secondary stroke prevention  Continue to follow up with PCP regarding cholesterol, blood pressure and diabetes management   Follow up with cardiology regarding TEE and loop recorder placement  Continue to monitor blood pressure at home  Maintain strict control of hypertension with blood pressure goal below 130/90, diabetes with hemoglobin A1c goal below 6.5% and cholesterol with LDL cholesterol (bad cholesterol) goal below 70 mg/dL. I also advised the patient to eat a healthy diet with plenty of whole grains, cereals, fruits and vegetables, exercise regularly and maintain ideal body weight.  Followup in the future with me in 6 months or call earlier if needed        Thank you for coming to see [place] at [facility]. I hope we have been able to provide you high quality care today.  You may receive a patient satisfaction survey over the next few weeks. We would appreciate your feedback and comments so that we may continue to improve ourselves and the health of our patients.

## 2017-11-24 NOTE — Progress Notes (Addendum)
[facility] [address] [place]. [place] [address] [phone]       OFFICE FOLLOW UP NOTE  Jonathan. Jonathan Arroyo Date of Birth:  08/03/1929 Medical Record Number:  [id number]   Referring MD:  Jeanann Lewandowsky, PA-C Reason for Referral:  TIA  HPI: Initial Consult 06/15/2017 :Jonathan Arroyo is a pleasant 84 year old Caucasian male who is accompanied today by his wife. He is referred for evaluation for a possible TIA episode. He states he was sitting on 06/09/16 after dinner when he all of a sudden noticed he was dizzy. He felt like he would about to pass out. He stooped over and when he looked up he felt off balance. He denied vertigo or headache. But he noticed that he could not see from the left half of his vision. If he were to read words hecould see on the the part of the work on the right side but not on the left. This lasted about 10 minutes and was followed by a slight pressure sensation in the back of his head. He denied any trouble with balance speech or double vision. Does have history of chronic dizziness which is had for a year. He describes this as a feeling of imbalance which may occur once or twice a day but is quite transient. He has been taking meclizine but is not sure whether it helps him or not. He denies true vertigo and sensation of fainting. He's had no brain imaging studies done. He does have chronic tinnitus in both ears and states that he hears like Cricket sounds.He has also noticed mild decrease hearing bilaterally. He denies any prior history of strokes, TIA, seizure, migraine, closed head injury with loss of consciousness. He has not had any brain imaging studies done following this episode.he also states that he has long-standing 8 history of gait imbalance and paresthesias in his feet and saw Dr. Jannifer Franklin several years ago. He was diagnosed with neuropathy and he also has tremor in his left hand and mild Parkinson's disease. He was never placed on parkinsonian  medications and he has not followed up with Dr. Jannifer Franklin. I am unable to obtain records of his prior neurological visits today 08/24/2017 visit PS; he returns for follow-up after last visit 2 months ago. He is accompanied by his wife. He states is doing well and has not had any recurrent stroke or TIA symptoms.He in fact feels that he noticed some difficulty with his right eye vision is not as clear but this is gradual process. Plans to see an eye doctor to get his vision evaluated soon.the patient had MRI scan of the brain done on 06/21/17 at [facility] which showed a punctate tiny enhancing right occipital white matter lesion which is probably a subacute infarct and could explain patient's episode of transient left-sided peripheral vision loss and dizziness which occurred in approximately 2 weeks prior. Patient had cardiac cath done by Dr. Julianne Handler and was found to have chronic LAD occlusion and triple-vessel disease but his stent was patent and no intervention was necessary. Patient states he is tolerating aspirin well without bleeding or bruising. He remains on Zocor denies any muscle aches or pains. His blood pressure is usually fine at home but today it is elevated in office at 170/90. Patient states he has long-standing history of irregular heartbeat but definitely atrial fibrillation has not yet been documented.he states his left upper extremity tremor is stable and not bothersome.  11/24/17 UPDATE: Patient returns today for 34-month follow-up and  is accompanied by his wife and daughter.  Patient continues to have dizziness and at times being unable to focus on certain items and has not noticed improving but has not noticed it worsening.  Continues to take aspirin without side effects of bleeding or bruising.  Continues to take simvastatin without side effects of myalgias.  Blood pressure today satisfactory 131/80.  Wife and patient have concerns of recent "syncope spells" where patient will be sitting  watching TV or other than her table and fall asleep.  No other symptoms or orders are associated with this.  Denies history of narcolepsy or having the symptoms in the past as they have been have occurring for the past few weeks.  Patient is aware of sitting in a certain place prior to this occurring but wife is not attempted to wake him after this and she allowed him to be sleeping.  Wife was able to state that it is more like he is falling asleep then losing consciousness.  Patient also has concerns of recently having difficulty with swallowing larger foods or sticky foods such as peanut butter.  He states in the past he has had to have a dilation of his esophagus and recommended for patient to follow-up with GI provider.  Denies aspiration-like symptoms of any other type of food.  When questioned regarding TEE and possible loop recorder insertion, patient states he was told by previous cardiologist that he does not have atrial fibrillation, just an irregular heartbeat.  Patient denies undergoing cardiac monitoring in the past and states that he had blood work done that showed he was negative for atrial fibrillation.  highly advised patient that an appointment is scheduled Dr. Rayann Heman regarding importance of TEE and loop recorder placement.  Wife is seen by Dr. Rayann Heman for her atrial fibrillation and both wife and patient agreed to have appointment scheduled for TEE/loop recorder.  Denies new or worsening stroke/TIA symptoms.   ROS:   14 system review of systems is positive for   Blurred vision, loss of vision and all other systems negative PMH:  Past Medical History:  Diagnosis Date  . Abdominal aneurysm without mention of rupture    a. s/p stent graft 2015 by [facility].  . Acute venous embolism and thrombosis of unspecified deep vessels of lower extremity   . Anxiety   . CAD (coronary artery disease)    a. s/p CABG 1994, redo 2005. b. [place] 03/2013: 2/3 patent bpg. s/p PTCA/DES to distal LM and  PTCA/DES to distal LCx.  . Chronic combined systolic and diastolic CHF (congestive heart failure) ([place])   . Esophageal reflux   . Hiatal hernia    HISTORY  . HTN (hypertension)   . Hyperlipidemia   . Ischemic cardiomyopathy    a. EF history: 2011: 35-40%, 11/2012: 45-50%. b. 03/2013: 35% by cath.  . Mild mitral regurgitation   . Myocardial infarction ([place])   . Other specified gastritis without mention of hemorrhage   . Premature atrial contractions   . Pulmonary embolism ([place]) 04/2016  . PVC's (premature ventricular contractions)   . Renal calculus    history  . Spinal stenosis, unspecified region other than cervical   . Stomach pain June 2013   Hospital stay at [facility]    Social History:  Social History   Socioeconomic History  . Marital status: Married    Spouse name: Not on file  . Number of children: Not on file  . Years of education: Not on file  .  Highest education level: Not on file  Occupational History  . Not on file  Social Needs  . Financial resource strain: Not on file  . Food insecurity:    Worry: Not on file    Inability: Not on file  . Transportation needs:    Medical: Not on file    Non-medical: Not on file  Tobacco Use  . Smoking status: Never Smoker  . Smokeless tobacco: Never Used  Substance and Sexual Activity  . Alcohol use: No    Alcohol/week: 0.0 oz  . Drug use: No  . Sexual activity: Not on file  Lifestyle  . Physical activity:    Days per week: Not on file    Minutes per session: Not on file  . Stress: Not on file  Relationships  . Social connections:    Talks on phone: Not on file    Gets together: Not on file    Attends religious service: Not on file    Active member of club or organization: Not on file    Attends meetings of clubs or organizations: Not on file    Relationship status: Not on file  . Intimate partner violence:    Fear of current or ex partner: Not on file    Emotionally abused: Not on file    Physically abused:  Not on file    Forced sexual activity: Not on file  Other Topics Concern  . Not on file  Social History Narrative  . Not on file    Medications:   Current Outpatient Medications on File Prior to Visit  Medication Sig Dispense Refill  . aspirin EC 325 MG tablet Take 1 tablet (325 mg total) by mouth daily. 30 tablet 0  . busPIRone (BUSPAR) 10 MG tablet Take 10 mg by mouth 2 (two) times daily as needed (for anxiety).     . carvedilol (COREG) 6.25 MG tablet Take 3.125 mg by mouth 2 (two) times daily with a meal.     . finasteride (PROSCAR) 5 MG tablet Take 5 mg by mouth daily.      . furosemide (LASIX) 20 MG tablet Take 20 mg by mouth daily as needed for fluid.    Marland Kitchen lisinopril (PRINIVIL,ZESTRIL) 40 MG tablet Take 20 mg by mouth 2 (two) times daily.     . meclizine (ANTIVERT) 12.5 MG tablet Take 12.5 mg by mouth 3 (three) times daily as needed for dizziness.    . nitroGLYCERIN (NITROSTAT) 0.4 MG SL tablet Place 0.4 mg under the tongue every 5 (five) minutes as needed for chest pain (MAX 3 TABLETS).     . polyethylene glycol (MIRALAX / GLYCOLAX) packet Take 17 g by mouth daily as needed for mild constipation.    . simvastatin (ZOCOR) 40 MG tablet Take 40 mg by mouth at bedtime.     . traMADol (ULTRAM) 50 MG tablet Take 50 mg by mouth 2 (two) times daily as needed for moderate pain.     Marland Kitchen zolpidem (AMBIEN) 10 MG tablet Take 10 mg by mouth at bedtime.      No current facility-administered medications on file prior to visit.     Allergies:   Allergies  Allergen Reactions  . Alum & Mag Hydroxide-Simeth Other (See Comments)    Unknown (Mylanta)  . Oxycodone-Acetaminophen Other (See Comments)    unknown    Physical Exam General: frail elderly pleasant Caucasian male seated, in no evident distress Head: head normocephalic and atraumatic.   Neck: supple with no  carotid or supraclavicular bruits Cardiovascular: regular rate and rhythm, no murmurs Musculoskeletal: no deformity Skin:  no  rash/petichiae Vascular:  Normal pulses all extremities  Neurologic Exam Mental Status: Awake and fully alert. Oriented to place and time. Recent and remote memory intact. Attention span, concentration and fund of knowledge appropriate. Mood and affect appropriate.   Cranial Nerves: Fundoscopic exam not donePupils equal, briskly reactive to light. Extraocular movements full without nystagmus. Visual fields full to confrontation. Hearing intact. Facial sensation intact. Face, tongue, palate moves normally and symmetrically.  Motor: Normal bulk and tone. Normal strength in all tested extremity muscles. Intermittent pill-rolling tremor in the left upper extremity Mild cogwheel rigidity at the left wrist upon activation. Sensory.:diminished touch , pinprick , position and vibratory sensation in lower extremity is from calf down along with decreased sensation on left upper extremity..  Coordination: Rapid alternating movements normal in all extremities. Finger-to-nose and heel-to-shin performed accurately bilaterally. Gait and Station: Arises from chair without difficulty. Stance is normal. Gait demonstrates shuffling gait with assistance of cane. Unable to heel, toe and tandem walk   Reflexes: 1+ and symmetric except both knee and ankle jerks are depressed. Toes downgoing.       ASSESSMENT: 84 year old Caucasian male with episode of transient dizziness and left-sided peripheral vision loss likely a right brain TIA he also has .long-standing history of lower extremity paresthesias likely due to chronic sensory polyneuropathy. Long-standing left upper extremity tremor and mild gait imbalance likely due to mild Parkinson's disease.  Patient returns today for follow-up visit and is accompanied by his wife and daughter.  PLAN: -Continue aspirin 325 mg daily  And Zocor for secondary stroke prevention -F/u with PCP regarding your HLD and HTN management -continue to monitor BP at home -schedule appointment  for TEE/LINQ to r/o AF -follow up with GI provider in regards to swallowing - in the mean time was advised to avoid large, hard food and sticky food  -Advised patient to follow-up with PCP in regards to recent blood work obtained and excessive daytime fatigue.  If all levels look well including CMP, CBC, B12, and TSH, possibly consider repeat MRI, EEG monitoring or sleep apnea monitoring for excessive fatigue -Follow-up with PCP in regards to increased fatigue with easily being able to fall asleep. -Maintain strict control of hypertension with blood pressure goal below 130/90, diabetes with hemoglobin A1c goal below 6.5% and cholesterol with LDL cholesterol (bad cholesterol) goal below 70 mg/dL. I also advised the patient to eat a healthy diet with plenty of whole grains, cereals, fruits and vegetables, exercise regularly and maintain ideal body weight.  Follow up in 6 months or call earlier if needed  Greater than 50% of time during this 25 minute visit was spent on counseling,explanation of diagnosis of TIA, reviewing risk factor management of HLD and HTN, planning of further management, discussion with patient and family and coordination of care  Venancio Poisson, AGNP-BC  [facility] [address] [place], [place] [address]  Phone [phone] Fax [phone] Note: This document was prepared with digital dictation and possible smart phrase technology. Any transcriptional errors that result from this process are unintentional.

## 2017-11-25 NOTE — Progress Notes (Signed)
I agree with the above plan 

## 2017-12-23 ENCOUNTER — Other Ambulatory Visit (HOSPITAL_COMMUNITY): Payer: Self-pay | Admitting: Interventional Radiology

## 2017-12-23 ENCOUNTER — Other Ambulatory Visit: Payer: Self-pay | Admitting: Radiology

## 2017-12-23 DIAGNOSIS — T82330D Leakage of aortic (bifurcation) graft (replacement), subsequent encounter: Secondary | ICD-10-CM

## 2017-12-23 DIAGNOSIS — IMO0001 Reserved for inherently not codable concepts without codable children: Secondary | ICD-10-CM

## 2018-01-31 ENCOUNTER — Encounter: Payer: Self-pay | Admitting: Radiology

## 2018-01-31 ENCOUNTER — Other Ambulatory Visit: Payer: Self-pay | Admitting: Radiology

## 2018-03-08 ENCOUNTER — Other Ambulatory Visit: Payer: Self-pay | Admitting: Radiology

## 2018-03-08 DIAGNOSIS — T82330D Leakage of aortic (bifurcation) graft (replacement), subsequent encounter: Secondary | ICD-10-CM

## 2018-03-08 DIAGNOSIS — IMO0001 Reserved for inherently not codable concepts without codable children: Secondary | ICD-10-CM

## 2018-03-15 ENCOUNTER — Telehealth: Payer: Self-pay | Admitting: Radiology

## 2018-03-15 ENCOUNTER — Other Ambulatory Visit: Payer: Medicare Other

## 2018-03-15 ENCOUNTER — Ambulatory Visit (HOSPITAL_COMMUNITY): Admission: RE | Admit: 2018-03-15 | Payer: Medicare Other | Source: Ambulatory Visit

## 2018-03-15 NOTE — Telephone Encounter (Signed)
Jonathan Arroyo called 03/14/2018 to cancel appointments for CT Angio and follow up with Dr. Laurence Ferrari.  Jonathan Arroyo states that he will call back when he is ready to reschedule.    Dr Laurence Ferrari notified 03/15/2018 at 9:40 am  Reece Levy, RN 03/15/2018 9:49 AM

## 2018-03-23 ENCOUNTER — Telehealth: Payer: Self-pay | Admitting: Neurology

## 2018-03-23 ENCOUNTER — Ambulatory Visit: Payer: Medicare Other | Admitting: Neurology

## 2018-03-23 ENCOUNTER — Other Ambulatory Visit: Payer: Self-pay | Admitting: Neurology

## 2018-03-23 ENCOUNTER — Encounter: Payer: Self-pay | Admitting: Neurology

## 2018-03-23 VITALS — BP 134/73 | HR 56 | Ht 74.0 in | Wt 188.0 lb

## 2018-03-23 DIAGNOSIS — M542 Cervicalgia: Secondary | ICD-10-CM

## 2018-03-23 DIAGNOSIS — I6300 Cerebral infarction due to thrombosis of unspecified precerebral artery: Secondary | ICD-10-CM

## 2018-03-23 DIAGNOSIS — R42 Dizziness and giddiness: Secondary | ICD-10-CM | POA: Diagnosis not present

## 2018-03-23 NOTE — Progress Notes (Signed)
bun

## 2018-03-23 NOTE — Progress Notes (Signed)
[facility] [address] [place]. [place] [address] [phone]       OFFICE FOLLOW UP NOTE  [name] Date of Birth:  Sep 09, 1929 Medical Record Number:  [id number]   Referring MD:  Jonathan Lewandowsky, PA-C Reason for Referral:  TIA  HPI: Initial Consult 06/15/2017 :[name] is a pleasant 84 year old Caucasian male who is accompanied today by his wife. He is referred for evaluation for a possible TIA episode. He states he was sitting on 06/09/16 after dinner when he all of a sudden noticed he was dizzy. He felt like he would about to pass out. He stooped over and when he looked up he felt off balance. He denied vertigo or headache. But he noticed that he could not see from the left half of his vision. If he were to read words hecould see on the the part of the work on the right side but not on the left. This lasted about 10 minutes and was followed by a slight pressure sensation in the back of his head. He denied any trouble with balance speech or double vision. Does have history of chronic dizziness which is had for a year. He describes this as a feeling of imbalance which may occur once or twice a day but is quite transient. He has been taking meclizine but is not sure whether it helps him or not. He denies true vertigo and sensation of fainting. He's had no brain imaging studies done. He does have chronic tinnitus in both ears and states that he hears like Cricket sounds.He has also noticed mild decrease hearing bilaterally. He denies any prior history of strokes, TIA, seizure, migraine, closed head injury with loss of consciousness. He has not had any brain imaging studies done following this episode.he also states that he has long-standing 8 history of gait imbalance and paresthesias in his feet and saw Dr. Jannifer Arroyo several years ago. He was diagnosed with neuropathy and he also has tremor in his left hand and mild Parkinson's disease. He was never placed on parkinsonian  medications and he has not followed up with Dr. Jannifer Arroyo. I am unable to obtain records of his prior neurological visits today 08/24/2017 visit PS; he returns for follow-up after last visit 2 months ago. He is accompanied by his wife. He states is doing well and has not had any recurrent stroke or TIA symptoms.He in fact feels that he noticed some difficulty with his right eye vision is not as clear but this is gradual process. Plans to see an eye doctor to get his vision evaluated soon.the patient had MRI scan of the brain done on 06/21/17 at [facility] which showed a punctate tiny enhancing right occipital white matter lesion which is probably a subacute infarct and could explain patient's episode of transient left-sided peripheral vision loss and dizziness which occurred in approximately 2 weeks prior. Patient had cardiac cath done by Dr. Julianne Arroyo and was found to have chronic LAD occlusion and triple-vessel disease but his stent was patent and no intervention was necessary. Patient states he is tolerating aspirin well without bleeding or bruising. He remains on Zocor denies any muscle aches or pains. His blood pressure is usually fine at home but today it is elevated in office at 170/90. Patient states he has long-standing history of irregular heartbeat but definitely atrial fibrillation has not yet been documented.he states his left upper extremity tremor is stable and not bothersome.  11/24/17 UPDATE: Patient returns today for 4-month follow-up and  is accompanied by his wife and daughter.  Patient continues to have dizziness and at times being unable to focus on certain items and has not noticed improving but has not noticed it worsening.  Continues to take aspirin without side effects of bleeding or bruising.  Continues to take simvastatin without side effects of myalgias.  Blood pressure today satisfactory 131/80.  Wife and patient have concerns of recent "syncope spells" where patient will be sitting  watching TV or other than her table and fall asleep.  No other symptoms or orders are associated with this.  Denies history of narcolepsy or having the symptoms in the past as they have been have occurring for the past few weeks.  Patient is aware of sitting in a certain place prior to this occurring but wife is not attempted to wake him after this and she allowed him to be sleeping.  Wife was able to state that it is more like he is falling asleep then losing consciousness.  Patient also has concerns of recently having difficulty with swallowing larger foods or sticky foods such as peanut butter.  He states in the past he has had to have a dilation of his esophagus and recommended for patient to follow-up with GI provider.  Denies aspiration-like symptoms of any other type of food.  When questioned regarding TEE and possible loop recorder insertion, patient states he was told by previous cardiologist that he does not have atrial fibrillation, just an irregular heartbeat.  Patient denies undergoing cardiac monitoring in the past and states that he had blood work done that showed he was negative for atrial fibrillation.  highly advised patient that an appointment is scheduled Dr. Rayann Arroyo regarding importance of TEE and loop recorder placement.  Wife is seen by Dr. Rayann Arroyo for her atrial fibrillation and both wife and patient agreed to have appointment scheduled for TEE/loop recorder.  Denies new or worsening stroke/TIA symptoms.  Update 03/23/2018 : He returns for follow-up after last visit 4 months ago.  He is accompanied by his wife.  The call requesting this visit as he is complaining of increased posterior neck pain and muscle tightness.  He states that this has been happening almost daily.  He describes as annoying sensation discomfort but no sharp shooting pain.  He rates this as 5/10 in severity.  It is pressure-like mostly and pulling sensation.  He does get dizzy and off-balance all the time but he does have  long-standing underlying sensory neuropathy.  He does takes Antivert as needed sometimes which helps.  He does have chronic tinnitus bilaterally with mild decreased hearing but denies true vertigo.  He still has significant burning and tingling numbness in his feet from his neuropathy.  Patient has not had any recent neck x-rays or scans done.  His vision acuity seems to have declined recently and he plans to discuss this with an upcoming ophthalmology appointment.  He denies any radicular pain, weakness in his hands.  ROS:   14 system review of systems is positive for   neck pain, leg swelling, walking difficulty, sleep talking, dizziness, decreased concentration and all other systems negative PMH:  Past Medical History:  Diagnosis Date  . Abdominal aneurysm without mention of rupture    a. s/p stent graft 2015 by [facility].  . Acute venous embolism and thrombosis of unspecified deep vessels of lower extremity   . Anxiety   . CAD (coronary artery disease)    a. s/p CABG 1994, redo 2005. b. [place]  03/2013: 2/3 patent bpg. s/p PTCA/DES to distal LM and PTCA/DES to distal LCx.  . Chronic combined systolic and diastolic CHF (congestive heart failure) ([place])   . Esophageal reflux   . Hiatal hernia    HISTORY  . HTN (hypertension)   . Hyperlipidemia   . Ischemic cardiomyopathy    a. EF history: 2011: 35-40%, 11/2012: 45-50%. b. 03/2013: 35% by cath.  . Mild mitral regurgitation   . Myocardial infarction ([place])   . Other specified gastritis without mention of hemorrhage   . Premature atrial contractions   . Pulmonary embolism ([place]) 04/2016  . PVC's (premature ventricular contractions)   . Renal calculus    history  . Spinal stenosis, unspecified region other than cervical   . Stomach pain June 2013   Hospital stay at [facility]  . Stroke [facility])     Social History:  Social History   Socioeconomic History  . Marital status: Married    Spouse name: Not on file  . Number of children: Not on  file  . Years of education: Not on file  . Highest education level: Not on file  Occupational History  . Not on file  Social Needs  . Financial resource strain: Not on file  . Food insecurity:    Worry: Not on file    Inability: Not on file  . Transportation needs:    Medical: Not on file    Non-medical: Not on file  Tobacco Use  . Smoking status: Never Smoker  . Smokeless tobacco: Never Used  Substance and Sexual Activity  . Alcohol use: No    Alcohol/week: 0.0 standard drinks  . Drug use: No  . Sexual activity: Not on file  Lifestyle  . Physical activity:    Days per week: Not on file    Minutes per session: Not on file  . Stress: Not on file  Relationships  . Social connections:    Talks on phone: Not on file    Gets together: Not on file    Attends religious service: Not on file    Active member of club or organization: Not on file    Attends meetings of clubs or organizations: Not on file    Relationship status: Not on file  . Intimate partner violence:    Fear of current or ex partner: Not on file    Emotionally abused: Not on file    Physically abused: Not on file    Forced sexual activity: Not on file  Other Topics Concern  . Not on file  Social History Narrative  . Not on file    Medications:   Current Outpatient Medications on File Prior to Visit  Medication Sig Dispense Refill  . aspirin EC 325 MG tablet Take 1 tablet (325 mg total) by mouth daily. 30 tablet 0  . busPIRone (BUSPAR) 10 MG tablet Take 10 mg by mouth 2 (two) times daily as needed (for anxiety).     . carvedilol (COREG) 6.25 MG tablet Take 3.125 mg by mouth 2 (two) times daily with a meal.     . finasteride (PROSCAR) 5 MG tablet Take 5 mg by mouth daily.      . furosemide (LASIX) 20 MG tablet Take 20 mg by mouth daily as needed for fluid.    Marland Kitchen lisinopril (PRINIVIL,ZESTRIL) 40 MG tablet Take 20 mg by mouth 2 (two) times daily.     . meclizine (ANTIVERT) 12.5 MG tablet Take 12.5 mg by mouth  3 (three)  times daily as needed for dizziness.    . nitroGLYCERIN (NITROSTAT) 0.4 MG SL tablet Place 0.4 mg under the tongue every 5 (five) minutes as needed for chest pain (MAX 3 TABLETS).     . polyethylene glycol (MIRALAX / GLYCOLAX) packet Take 17 g by mouth daily as needed for mild constipation.    . simvastatin (ZOCOR) 40 MG tablet Take 40 mg by mouth at bedtime.     . traMADol (ULTRAM) 50 MG tablet Take 50 mg by mouth 2 (two) times daily as needed for moderate pain.     Marland Kitchen zolpidem (AMBIEN) 10 MG tablet Take 10 mg by mouth at bedtime.      No current facility-administered medications on file prior to visit.     Allergies:   Allergies  Allergen Reactions  . Alum & Mag Hydroxide-Simeth Other (See Comments)    Unknown (Mylanta)  . Oxycodone-Acetaminophen Other (See Comments)    unknown    Physical Exam General: frail elderly pleasant Caucasian male seated, in no evident distress Head: head normocephalic and atraumatic.   Neck: supple with no carotid or supraclavicular bruits Cardiovascular: regular rate and rhythm, no murmurs Musculoskeletal: no deformity.  Mild spasm of posterior neck muscles with limitation of neck rotation to the right. Skin:  no rash/petichiae Vascular:  Normal pulses all extremities  Neurologic Exam Mental Status: Awake and fully alert. Oriented to place and time. Recent and remote memory intact. Attention span, concentration and fund of knowledge appropriate. Mood and affect appropriate.   Cranial Nerves: Fundoscopic exam not donePupils equal, briskly reactive to light. Extraocular movements full without nystagmus. Visual fields full to confrontation. Hearing mildly diminished bilaterally.. Facial sensation intact. Face, tongue, palate moves normally and symmetrically.  Motor: Normal bulk and tone. Normal strength in all tested extremity muscles. Intermittent pill-rolling tremor in the left upper extremity Mild cogwheel rigidity at the left wrist upon  activation. Sensory.:diminished touch , pinprick , position and vibratory sensation in lower extremity is from calf down along with decreased sensation on left upper extremity..  Coordination: Rapid alternating movements normal in all extremities. Finger-to-nose and heel-to-shin performed accurately bilaterally. Gait and Station: Arises from chair without difficulty. Stance is normal. Gait demonstrates shuffling gait with assistance of cane. Unable to heel, toe and tandem walk   Reflexes: 1+ and symmetric except both knee and ankle jerks are depressed. Toes downgoing.       ASSESSMENT: 84 year-old Caucasian male with episode of transient dizziness and left-sided peripheral vision loss likely a right brain TIA he also has .long-standing history of lower extremity paresthesias likely due to chronic sensory polyneuropathy.  New complaints of posterior neck pain and discomfort likely musculoskeletal pain.  PLAN: I had a long discussion with the patient and his wife regarding his posterior neck discomfort and pain which likely seems mechanical and I recommend he do regular neck stretching exercises and apply local heat.  Check MRI scan of the cervical spine to rule out any compressive lesion.  He was also counseled to get up slowly and avoid sudden movements as he has chronic dizziness and imbalance likely multifactorial with significant component of underlying sensory polyneuropathy.  Greater than 50% time during this 25-minute visit was spent on counseling and coordination of care about his neck pain, dizziness and gait imbalance and answering questions he will continue follow-up with my nurse practitioner Janett Billow as previously scheduled on call earlier if necessary.  Antony Contras, MD [facility] [address] [place], [place]  [address]  Phone [phone] Fax [phone] Note: This document was prepared with digital dictation and possible smart phrase technology.  Any transcriptional errors that result from this process are unintentional.

## 2018-03-23 NOTE — Telephone Encounter (Signed)
[employer] medicare auth: NPR lvm for pt to be aware that he is scheduled at [facility] for 03/30/18 arrival time at 10:15 for labs then 11:15 AM for the MRI.  I'll need a BUN and Creatine lab order for me to fax along with the MRI order to [facility].

## 2018-03-23 NOTE — Patient Instructions (Signed)
I had a long discussion with the patient and his wife regarding his posterior neck discomfort and pain which likely seems mechanical and I recommend he do regular neck stretching exercises and apply local heat.  Check MRI scan of the cervical spine to rule out any compressive lesion.  He was also counseled to get up slowly and avoid sudden movements as he has chronic dizziness and imbalance likely multifactorial with significant component of underlying sensory polyneuropathy.  He will continue follow-up with my nurse practitioner Janett Billow as previously scheduled on call earlier if necessary.   Neck Exercises Neck exercises can be important for many reasons:  They can help you to improve and maintain flexibility in your neck. This can be especially important as you age.  They can help to make your neck stronger. This can make movement easier.  They can reduce or prevent neck pain.  They may help your upper back.  Ask your health care provider which neck exercises would be best for you. Exercises Neck Press Repeat this exercise 10 times. Do it first thing in the morning and right before bed or as told by your health care provider. 1. Lie on your back on a firm bed or on the floor with a pillow under your head. 2. Use your neck muscles to push your head down on the pillow and straighten your spine. 3. Hold the position as well as you can. Keep your head facing up and your chin tucked. 4. Slowly count to 5 while holding this position. 5. Relax for a few seconds. Then repeat.  Isometric Strengthening Do a full set of these exercises 2 times a day or as told by your health care provider. 1. Sit in a supportive chair and place your hand on your forehead. 2. Push forward with your head and neck while pushing back with your hand. Hold for 10 seconds. 3. Relax. Then repeat the exercise 3 times. 4. Next, do thesequence again, this time putting your hand against the back of your head. Use your head  and neck to push backward against the hand pressure. 5. Finally, do the same exercise on either side of your head, pushing sideways against the pressure of your hand.  Prone Head Lifts Repeat this exercise 5 times. Do this 2 times a day or as told by your health care provider. 1. Lie face-down, resting on your elbows so that your chest and upper back are raised. 2. Start with your head facing downward, near your chest. Position your chin either on or near your chest. 3. Slowly lift your head upward. Lift until you are looking straight ahead. Then continue lifting your head as far back as you can stretch. 4. Hold your head up for 5 seconds. Then slowly lower it to your starting position.  Supine Head Lifts Repeat this exercise 8-10 times. Do this 2 times a day or as told by your health care provider. 1. Lie on your back, bending your knees to point to the ceiling and keeping your feet flat on the floor. 2. Lift your head slowly off the floor, raising your chin toward your chest. 3. Hold for 5 seconds. 4. Relax and repeat.  Scapular Retraction Repeat this exercise 5 times. Do this 2 times a day or as told by your health care provider. 1. Stand with your arms at your sides. Look straight ahead. 2. Slowly pull both shoulders backward and downward until you feel a stretch between your shoulder blades in your upper back.  3. Hold for 10-30 seconds. 4. Relax and repeat.  Contact a health care provider if:  Your neck pain or discomfort gets much worse when you do an exercise.  Your neck pain or discomfort does not improve within 2 hours after you exercise. If you have any of these problems, stop exercising right away. Do not do the exercises again unless your health care provider says that you can. Get help right away if:  You develop sudden, severe neck pain. If this happens, stop exercising right away. Do not do the exercises again unless your health care provider says that you  can. Exercises Neck Stretch  Repeat this exercise 3-5 times. 1. Do this exercise while standing or while sitting in a chair. 2. Place your feet flat on the floor, shoulder-width apart. 3. Slowly turn your head to the right. Turn it all the way to the right so you can look over your right shoulder. Do not tilt or tip your head. 4. Hold this position for 10-30 seconds. 5. Slowly turn your head to the left, to look over your left shoulder. 6. Hold this position for 10-30 seconds.  Neck Retraction Repeat this exercise 8-10 times. Do this 3-4 times a day or as told by your health care provider. 1. Do this exercise while standing or while sitting in a sturdy chair. 2. Look straight ahead. Do not bend your neck. 3. Use your fingers to push your chin backward. Do not bend your neck for this movement. Continue to face straight ahead. If you are doing the exercise properly, you will feel a slight sensation in your throat and a stretch at the back of your neck. 4. Hold the stretch for 1-2 seconds. Relax and repeat.  This information is not intended to replace advice given to you by your health care provider. Make sure you discuss any questions you have with your health care provider. Document Released: 04/15/2015 Document Revised: 10/10/2015 Document Reviewed: 11/12/2014 Elsevier Interactive Patient Education  [employer].

## 2018-03-23 NOTE — Telephone Encounter (Signed)
Done

## 2018-03-29 NOTE — Telephone Encounter (Signed)
Pt r/s at [facility] for 04/04/18.

## 2018-04-08 ENCOUNTER — Telehealth: Payer: Self-pay | Admitting: Neurology

## 2018-04-08 NOTE — Telephone Encounter (Signed)
Patient had questions regarding MRI order, wasn't sure if he needed MRI brain or neck. I got a ph call from [place] at [facility], patient has proceeded with neck MRI. In reviewing his orders, he has MRA neck and brain MRI pending also.  Dr. Leonie Man, please review and advise, if patient needs all of the scans. Per Caryl Pina, patient had concerns re: cost of scans too.

## 2018-04-11 NOTE — Telephone Encounter (Signed)
RN call [facility] outpatient imaging. Rn stated Dr Leonie Man only order MR cervical spine because pt had a lot complaints of neck discomfort and pain. RN stated MRI brain was not discuss in last visit. Pt had a MRI brain order in [place] it was done in 06/2017. They verbalized understanding.The MR cervical spine was already done by [facility] imaging department.

## 2018-04-20 ENCOUNTER — Telehealth: Payer: Self-pay

## 2018-04-20 ENCOUNTER — Other Ambulatory Visit: Payer: Self-pay | Admitting: Neurology

## 2018-04-20 DIAGNOSIS — M4802 Spinal stenosis, cervical region: Secondary | ICD-10-CM

## 2018-04-20 NOTE — Telephone Encounter (Signed)
Report given to Dr.Sethi for results of MR Cervical Spine.

## 2018-04-20 NOTE — Telephone Encounter (Signed)
I spoke to the patient and gave him results of MRI scan of the cervical spine showing severe spinal stenosis at C3-4 with cord compression as well as moderate spinal stenosis at C4-5 and C6-7 as well.  Patient complains of neck pain but denies radicular pain or significant gait or balance problems.  He has had previous neck surgery done by Dr. Glenna Fellows and he prefers that patient be referred to him.  I will place a urgent referral for Dr. Carloyn Manner to see the patient soon.

## 2018-04-22 ENCOUNTER — Telehealth: Payer: Self-pay | Admitting: Neurology

## 2018-04-22 NOTE — Telephone Encounter (Signed)
Ok thanks 

## 2018-04-22 NOTE — Telephone Encounter (Signed)
Called patient and spoke to him relayed that Glenna Fellows is retiring in April 2020 . Dr. Carloyn Manner is not taking any patient's at this time . I asked patient if he wanted me to send his Referral to [facility] , Patient relayed to hold off on  referral he would call me back and tell me where he wanted referral to go. Thanks [name].

## 2018-05-27 ENCOUNTER — Ambulatory Visit: Payer: Medicare Other | Admitting: Adult Health

## 2018-06-24 ENCOUNTER — Ambulatory Visit: Payer: Medicare Other | Admitting: Adult Health

## 2018-07-04 ENCOUNTER — Telehealth: Payer: Self-pay | Admitting: Cardiovascular Disease

## 2018-07-04 NOTE — Telephone Encounter (Signed)
New Message   STAT if patient feels like he/she is going to faint   1) Are you dizzy now? yes  2) Do you feel faint or have you passed out? no  3) Do you have any other symptoms?  4) Have you checked your HR and BP (record if available)? BP 101/51 HR 83 Oxygen 97% all taken at 9:14am this morning    Christine with [facility] is calling on behalf of patient she states that she spoke with patient this morning and he complained of more dizziness then usually. She wants [place] to follow up with the patient.

## 2018-07-04 NOTE — Telephone Encounter (Signed)
Called and spoke to patient. Made patient aware that Altha Harm from [facility] called and states that the patient reported being more dizziness than usual. Patient states that he has had dizziness for ages. He denies any falls, syncope, pre-syncope, chest pain, irregular beats, or any other Sx. Patient states that his dizziness is while he is seated and when he turns his head. His BP is 101/51 HR 83. Patient with Hx of stroke and was being considered for LINQ at one point. Patient states that he did not want to proceed with that at this time. Patient is overdue for follow up with Dr. Angelena Form. Appointment scheduled for 07/18/18 at 1:40 PM. Instructed for patient to let us know if his Sx change or worsen.

## 2018-07-18 ENCOUNTER — Encounter: Payer: Self-pay | Admitting: Cardiovascular Disease

## 2018-07-18 ENCOUNTER — Ambulatory Visit: Payer: Medicare Other | Admitting: Cardiovascular Disease

## 2018-07-18 ENCOUNTER — Encounter (INDEPENDENT_AMBULATORY_CARE_PROVIDER_SITE_OTHER): Payer: Self-pay

## 2018-07-18 VITALS — BP 160/92 | HR 72 | Ht 74.0 in | Wt 186.1 lb

## 2018-07-18 DIAGNOSIS — I25708 Atherosclerosis of coronary artery bypass graft(s), unspecified, with other forms of angina pectoris: Secondary | ICD-10-CM | POA: Diagnosis not present

## 2018-07-18 DIAGNOSIS — I491 Atrial premature depolarization: Secondary | ICD-10-CM

## 2018-07-18 DIAGNOSIS — I10 Essential (primary) hypertension: Secondary | ICD-10-CM

## 2018-07-18 DIAGNOSIS — I5022 Chronic systolic (congestive) heart failure: Secondary | ICD-10-CM

## 2018-07-18 DIAGNOSIS — G459 Transient cerebral ischemic attack, unspecified: Secondary | ICD-10-CM | POA: Diagnosis not present

## 2018-07-18 DIAGNOSIS — I255 Ischemic cardiomyopathy: Secondary | ICD-10-CM

## 2018-07-18 MED ORDER — CARVEDILOL 6.25 MG PO TABS: 6.2500 mg | ORAL_TABLET | Freq: Two times a day (BID) | ORAL | 3 refills | Status: DC

## 2018-07-18 NOTE — Progress Notes (Signed)
Chief Complaint  Patient presents with  . Follow-up    CAD   History of Present Illness: 85 yo male with history of HLD, HTN, GERD, CAD s/p CABG (1994 redo in 2005), ischemic cardiomyopathy, MR, hiatal hernia, DVT who is here today for cardiac follow up. He has been followed in the past by Dr. Lia Foyer. Redo CABG 07/04/03 with right radial graft to PDA, SVG to Circumflex. His LIMA to LAD was patent by cath. His AAA is followed by Dr. Oneida Alar in [facility]. He was admitted November 2014 with chest pain c/w angina. Cardiac cath 04/10/13 and found to have patent LIMA to LAD, patent free radial graft to PDA but occluded SVG to Circumflex system with 99% distal left main stenosis supplying the Circumflex as well as severe distal Circumflex stenosis. A 2.75 x 20 mm Promus Premier DES was deployed in the distal Circumflex extending into the second OM branch. A 3.0 x 16 mm Promus Premier DES was deployed in the distal left main extending into the proximal Circumflex.  Aortic stent graft placed October 2015 by Dr. Oneida Alar. He was seen in our office March 2017 by Ellen Henri, PA-C and had weight gain and dyspnea which responded to Lasix. Echo April 2017 with LVEF=45-50%, moderate MR. He was diagnosed with a PE in December 2017 and is now on Xarelto. He was seen in our office 03/02/17 by Lyda Jester, PA-C and had c/o worsened dyspnea with some chest pain. Workup in primary care with elevated d-dimer but CTA chest negative for PE. Nuclear stress test 03/18/17 with inferior, inferolateral and apical scar but no ischemia. I saw him back in the office 03/19/17 and he was feeling well. He did not wish to pursue a repeat cardiac cath. I saw him in January 2019 and he c/o dyspnea. He refused cath. Lasix was started. He was seen in our office 06/11/17 by Melina Copa, PA and there was concern for a stroke given new visual changes. He refused admission. He saw neurology who felt that he had a TIA. His ASA was increased to 325 mg  daily. Pt refused to take Plavix due to cost. He was seen again 06/15/17 in our office by Estella Husk, PA and c/o ongoing dyspnea with exertional chest pain. Imdur was added. He continued to have dyspnea and chest pain. Cardiac cath February 2019 showed severe native vessel CAD with occlusion of the ostial LAD and ostial RCA. The LIMA to the LAD is patent. The free radial graft to the PDA is patent. The Left main/Circumflex stent is patent. The OM2 stent is patent. Echo November 2018 with LVEF=50-55%. Mild MR. Loop recorder recommended in January 2019 by Neurology post TIA but he missed his appt with our EP team in march 2019 and did not return phone calls to reschedule this.   He is here today for follow up. The patient denies any palpitations, lower extremity edema, orthopnea, PND, dizziness, near syncope or syncope. He has rare chest pains. He has dyspnea with exertion. No recent weight gain or LE edema. He has chronic dizziness.    Primary Care Physician: Raina Mina., MD  Past Medical History:  Diagnosis Date  . Abdominal aneurysm without mention of rupture    a. s/p stent graft 2015 by [facility].  . Acute venous embolism and thrombosis of unspecified deep vessels of lower extremity   . Anxiety   . CAD (coronary artery disease)    a. s/p CABG 1994, redo 2005. b. [place] 03/2013:  2/3 patent bpg. s/p PTCA/DES to distal LM and PTCA/DES to distal LCx.  . Chronic combined systolic and diastolic CHF (congestive heart failure) ([place])   . Esophageal reflux   . Hiatal hernia    HISTORY  . HTN (hypertension)   . Hyperlipidemia   . Ischemic cardiomyopathy    a. EF history: 2011: 35-40%, 11/2012: 45-50%. b. 03/2013: 35% by cath.  . Mild mitral regurgitation   . Myocardial infarction ([place])   . Other specified gastritis without mention of hemorrhage   . Premature atrial contractions   . Pulmonary embolism ([place]) 04/2016  . PVC's (premature ventricular contractions)   . Renal calculus     history  . Spinal stenosis, unspecified region other than cervical   . Stomach pain June 2013   Hospital stay at [facility]  . Stroke [facility])     Past Surgical History:  Procedure Laterality Date  . ABDOMINAL AORTIC ANEURYSM REPAIR    . ABDOMINAL AORTIC ENDOVASCULAR STENT GRAFT N/A 03/14/2014   Procedure: ABDOMINAL AORTIC ENDOVASCULAR STENT GRAFT;  Surgeon: Elam Dutch, MD;  Location: [facility];  Service: Vascular;  Laterality: N/A;  . BACK SURGERY    . CORONARY ANGIOPLASTY WITH STENT PLACEMENT  04/10/2013   DES LEFT MAIN DES LEFT CIRCUMFLEX    DR [place]ia  . CORONARY ARTERY BYPASS GRAFT  1994 and  2005  . HIATAL HERNIA REPAIR    . IR GENERIC HISTORICAL  07/01/2016   IR RADIOLOGIST EVAL & MGMT 07/01/2016 Jacqulynn Cadet, MD [facility] INTERV RAD  . IR GENERIC HISTORICAL  07/24/2016   IR ANGIOGRAM SELECTIVE EACH ADDITIONAL VESSEL 07/24/2016 Jacqulynn Cadet, MD [facility]  . IR GENERIC HISTORICAL  07/24/2016   IR ANGIOGRAM SELECTIVE EACH ADDITIONAL VESSEL 07/24/2016 Jacqulynn Cadet, MD [facility]  . IR GENERIC HISTORICAL  07/24/2016   IR ANGIOGRAM VISCERAL SELECTIVE 07/24/2016 Jacqulynn Cadet, MD [facility]  . IR GENERIC HISTORICAL  07/24/2016   IR US GUIDE VASC ACCESS RIGHT 07/24/2016 Jacqulynn Cadet, MD [facility]  . IR GENERIC HISTORICAL  07/24/2016   IR ANGIOGRAM SELECTIVE EACH ADDITIONAL VESSEL 07/24/2016 Jacqulynn Cadet, MD [facility]  . IR GENERIC HISTORICAL  07/24/2016   IR ANGIOGRAM PELVIS SELECTIVE OR SUPRASELECTIVE 07/24/2016 Jacqulynn Cadet, MD [facility]  . IR GENERIC HISTORICAL  07/24/2016   IR ANGIOGRAM SELECTIVE EACH ADDITIONAL VESSEL 07/24/2016 Jacqulynn Cadet, MD [facility]  . IR GENERIC HISTORICAL  07/24/2016   IR EMBO ARTERIAL NOT HEMORR HEMANG INC GUIDE ROADMAPPING 07/24/2016 Jacqulynn Cadet, MD [facility]  . IR RADIOLOGIST EVAL & MGMT  08/27/2016  . IR RADIOLOGIST EVAL & MGMT  02/09/2017  . JOINT REPLACEMENT  2005   Right knee  . LEFT HEART CATHETERIZATION WITH CORONARY  ANGIOGRAM N/A 04/10/2013   Procedure: LEFT HEART CATHETERIZATION WITH CORONARY ANGIOGRAM;  Surgeon: Burnell Blanks, MD;  Location: [facility] CATH LAB;  Service: Cardiovascular;  Laterality: N/A;  . PTCA    . RIGHT/LEFT HEART CATH AND CORONARY/GRAFT ANGIOGRAPHY N/A 07/02/2017   Procedure: RIGHT/LEFT HEART CATH AND CORONARY/GRAFT ANGIOGRAPHY;  Surgeon: Burnell Blanks, MD;  Location: [facility] CV LAB;  Service: Cardiovascular;  Laterality: N/A;  . ROTATOR CUFF REPAIR Left   . SPINE SURGERY     X's 3  . TOTAL KNEE ARTHROPLASTY     right    Current Outpatient Medications  Medication Sig Dispense Refill  . aspirin EC 325 MG tablet Take 1 tablet (325 mg total) by mouth daily. 30 tablet 0  . busPIRone (BUSPAR) 10  MG tablet Take 10 mg by mouth 2 (two) times daily as needed (for anxiety).     . finasteride (PROSCAR) 5 MG tablet Take 5 mg by mouth daily.      . furosemide (LASIX) 20 MG tablet Take 20 mg by mouth daily as needed for fluid.    Marland Kitchen gabapentin (NEURONTIN) 300 MG capsule Take 1 capsule by mouth daily.    Marland Kitchen lisinopril (PRINIVIL,ZESTRIL) 40 MG tablet Take 20 mg by mouth 2 (two) times daily.     . meclizine (ANTIVERT) 12.5 MG tablet Take 12.5 mg by mouth 3 (three) times daily as needed for dizziness.    . nitroGLYCERIN (NITROSTAT) 0.4 MG SL tablet Place 0.4 mg under the tongue every 5 (five) minutes as needed for chest pain (MAX 3 TABLETS).     . polyethylene glycol (MIRALAX / GLYCOLAX) packet Take 17 g by mouth daily as needed for mild constipation.    . simvastatin (ZOCOR) 40 MG tablet Take 40 mg by mouth at bedtime.     . traMADol (ULTRAM) 50 MG tablet Take 50 mg by mouth 2 (two) times daily as needed for moderate pain.     Marland Kitchen zolpidem (AMBIEN) 10 MG tablet Take 10 mg by mouth at bedtime.     . carvedilol (COREG) 6.25 MG tablet Take 1 tablet (6.25 mg total) by mouth 2 (two) times daily. 180 tablet 3   No current facility-administered medications for this visit.     Allergies    Allergen Reactions  . Alum & Mag Hydroxide-Simeth Other (See Comments)    Unknown (Mylanta)  . Oxycodone-Acetaminophen Other (See Comments)    unknown    Social History   Socioeconomic History  . Marital status: Married    Spouse name: Not on file  . Number of children: Not on file  . Years of education: Not on file  . Highest education level: Not on file  Occupational History  . Not on file  Social Needs  . Financial resource strain: Not on file  . Food insecurity:    Worry: Not on file    Inability: Not on file  . Transportation needs:    Medical: Not on file    Non-medical: Not on file  Tobacco Use  . Smoking status: Never Smoker  . Smokeless tobacco: Never Used  Substance and Sexual Activity  . Alcohol use: No    Alcohol/week: 0.0 standard drinks  . Drug use: No  . Sexual activity: Not on file  Lifestyle  . Physical activity:    Days per week: Not on file    Minutes per session: Not on file  . Stress: Not on file  Relationships  . Social connections:    Talks on phone: Not on file    Gets together: Not on file    Attends religious service: Not on file    Active member of club or organization: Not on file    Attends meetings of clubs or organizations: Not on file    Relationship status: Not on file  . Intimate partner violence:    Fear of current or ex partner: Not on file    Emotionally abused: Not on file    Physically abused: Not on file    Forced sexual activity: Not on file  Other Topics Concern  . Not on file  Social History Narrative  . Not on file    Family History  Problem Relation Age of Onset  . Coronary artery disease Mother   .  Diabetes Mother   . Heart disease Mother   . Hyperlipidemia Mother   . Hypertension Mother   . Heart attack Mother   . Heart disease Father   . Hyperlipidemia Father   . Hypertension Father   . Heart attack Father   . Stroke Neg Hx     Review of Systems:  As stated in the HPI and otherwise negative.    BP (!) 160/92   Pulse 72   Ht 6\' 2"  (1.88 m)   Wt 84.4 kg   SpO2 98%   BMI 23.90 kg/m   Physical Examination:  General: Well developed, well nourished, NAD  HEENT: OP clear, mucus membranes moist  SKIN: warm, dry. No rashes. Neuro: No focal deficits  Musculoskeletal: Muscle strength 5/5 all ext  Psychiatric: Mood and affect normal  Neck: No JVD, no carotid bruits, no thyromegaly, no lymphadenopathy.  Lungs:Clear bilaterally, no wheezes, rhonci, crackles Cardiovascular: Regular rate and rhythm. No murmurs, gallops or rubs. Abdomen:Soft. Bowel sounds present. Non-tender.  Extremities: No lower extremity edema. Pulses are 2 + in the bilateral DP/PT.  Echo 03/18/17: - Left ventricle: Abnormal septal motion The cavity size was   normal. Wall thickness was increased in a pattern of severe LVH.   Systolic function was normal. The estimated ejection fraction was   in the range of 50% to 55%. - Mitral valve: Calcified annulus. Mildly thickened leaflets .   There was mild regurgitation. - Left atrium: The atrium was moderately dilated. - Atrial septum: No defect or patent foramen ovale was identified.  EKG:  EKG is ordered today. The ekg ordered today demonstrates normal sinus rhythm, rate 72 bpm. Inferior Q waves  Recent Labs: No results found for requested labs within last 8760 hours.   Lipid Panel    Component Value Date/Time   CHOL 130 06/15/2017 1042   TRIG 168 (H) 06/15/2017 1042   HDL 33 (L) 06/15/2017 1042   CHOLHDL 3.9 06/15/2017 1042   LDLCALC 63 06/15/2017 1042     Wt Readings from Last 3 Encounters:  07/18/18 84.4 kg  03/23/18 85.3 kg  11/24/17 84.4 kg     Other studies Reviewed: Additional studies/ records that were reviewed today include: . Review of the above records demonstrates:    Assessment and Plan:   1. CAD with stable angina: He is s/p CABG. Last cath February 2019 with 2 patent bypass grafts. He has no exertional chest pain. Will continue  ASA, statin and beta blocker.   2. PACs, PVCs: Rare palpitations. Continue beta blocker.  .   3. Aortic valve insufficiency: Trivial by echo November 2018  4. Ischemic CM: Last LVEF=50-55% by echo 03/18/17. Continue beta blocker and Ace-inh.   5. Chronic Systolic CHF: He has dyspnea with exertion but no evidence of volume overload. No LE edema. Weight is stable. He had no improvement in dyspnea on Lasix so it was stopped last year. I do not think his dyspnea is cardiac related.   6. Hypertension: BP is elevated. Will increase Coreg to 6.25 mg po BID. He will have f/u in primary care soon to have it rechecked.   7. Hyperlipidemia: Lipids followed in primary care. Continue statin.   8. AAA: Followed by [facility] and now s/p aortic stent graft. He has been seen by IR for endoleak. This is felt to be stable.   9. Mitral Regurgitation: Mild by echo 03/18/17.   10. TIA: He has been seen by Dr. Leonie Man in neurology. There had been  a recommendation for a loop recorder in February 2019. He missed his appt to see Dr. Lennie Odor in march 2019 and then did not return calls from our office to set up this visit to plan the loop recorder. He tells me today that he does not wish to have this done.    Current medicines are reviewed at length with the patient today.  The patient does not have concerns regarding medicines.  The following changes have been made:  no change  Labs/ tests ordered today include:   No orders of the defined types were placed in this encounter.  Disposition:   FU with me in 12 months  Signed, Lauree Chandler, MD 07/18/2018 2:14 PM    [facility] [place], [place], [place]  [address] Phone: [phone]; Fax: [phone]

## 2018-07-18 NOTE — Patient Instructions (Signed)
Medication Instructions:  Your physician has recommended you make the following change in your medication: Increase Coreg to 6.25 mg by mouth twice daily.  If you need a refill on your cardiac medications before your next appointment, please call your pharmacy.   Lab work: none If you have labs (blood work) drawn today and your tests are completely normal, you will receive your results only by: Marland Kitchen MyChart Message (if you have MyChart) OR . A paper copy in the mail If you have any lab test that is abnormal or we need to change your treatment, we will call you to review the results.  Testing/Procedures: none  Follow-Up: At [facility], you and your health needs are our priority.  As part of our continuing mission to provide you with exceptional heart care, we have created designated Provider Care Teams.  These Care Teams include your primary Cardiologist (physician) and Advanced Practice Providers (APPs -  Physician Assistants and Nurse Practitioners) who all work together to provide you with the care you need, when you need it. You will need a follow up appointment in 12 months.  Please call our office 2 months in advance to schedule this appointment.  You may see Lauree Chandler, MD or one of the following Advanced Practice Providers on your designated Care Team:   [place], PA-C Melina Copa, PA-C . Ermalinda Barrios, PA-C  Any Other Special Instructions Will Be Listed Below (If Applicable).

## 2018-07-18 NOTE — Addendum Note (Signed)
Addended by: Jeremy Johann on: 07/18/2018 04:45 PM   Modules accepted: Orders

## 2018-08-05 ENCOUNTER — Telehealth: Payer: Self-pay | Admitting: Cardiovascular Disease

## 2018-08-05 NOTE — Telephone Encounter (Signed)
Left message to call back  

## 2018-08-05 NOTE — Telephone Encounter (Signed)
New Message:    Patient nurse calling about the patient BP. Nurse would like to know what number would you like for BP to be reported. Please call nurse if any questions.

## 2018-08-08 NOTE — Telephone Encounter (Signed)
Pt is on home monitoring system for BP. Nurse Belenda Cruise from [facility] needs parameters for BP readings so you are not inundated with frequent calls.  Pt usually runs 179/88- to 150/90 and this sets alarms off. *Please call nurse back with parameters fr her to contact office.  *May leave voice message. Belenda Cruise [phone] ext [phone]

## 2018-08-09 NOTE — Telephone Encounter (Signed)
His blood pressure is followed in primary care. I would have the nurse call his primary care doctor to guide this plan. Jonathan Arroyo

## 2018-08-09 NOTE — Telephone Encounter (Signed)
Spoke with [name] and made her aware to reach out to pt's PCP.  [name] verbalized understanding.

## 2019-01-18 IMAGING — CT CT CTA ABD/PEL W/CM AND/OR W/O CM
3 of 11 series · 11 of 46 positions shown, 16 images · IV contrast (ISOVUE 370)
Comparison: 01/28/2016 and previous

CLINICAL DATA: abdominal aortic aneurysm, post stent graft
placement. History endoleak, with attempted embolization.

EXAM:
CTA ABDOMEN AND PELVIS WITHOUT AND WITH CONTRAST
TECHNIQUE: Multidetector CT imaging of the abdomen and pelvis was performed
using the standard protocol during bolus administration of
intravenous contrast. Multiplanar reconstructed images and MIPs were
obtained and reviewed to evaluate the vascular anatomy.
CONTRAST:  Isovue 370 IV

[Series 5: axial arterial · axial · arterial · 0.88mm/px · z∈[+993,+1257]mm · 3 of 178 slices shown, 7 images]
[im 45/178  soft-tissue]
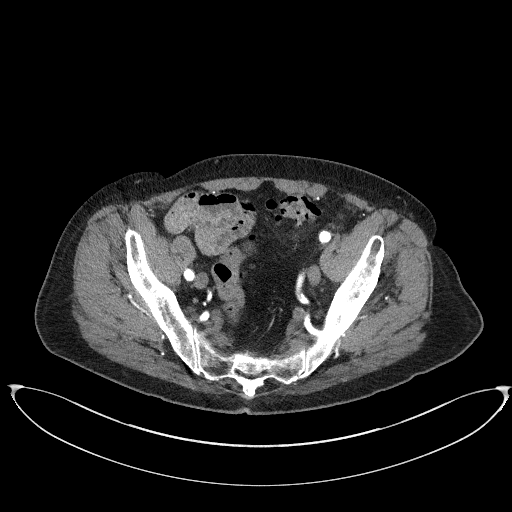
[im 45/178  lung]
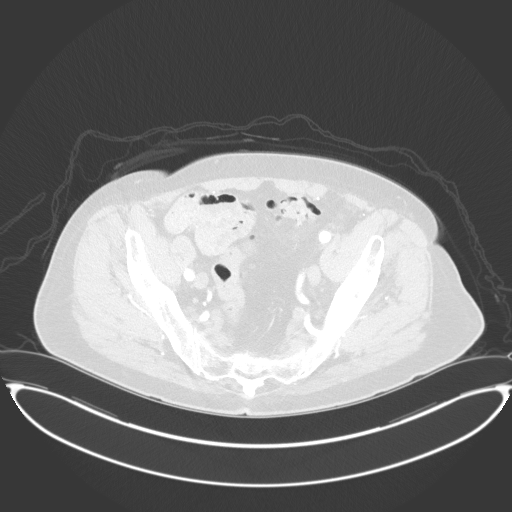
[im 45/178  bone]
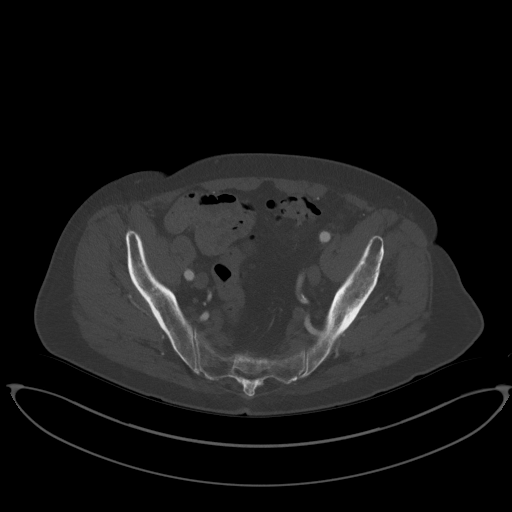
[im 89/178  soft-tissue]
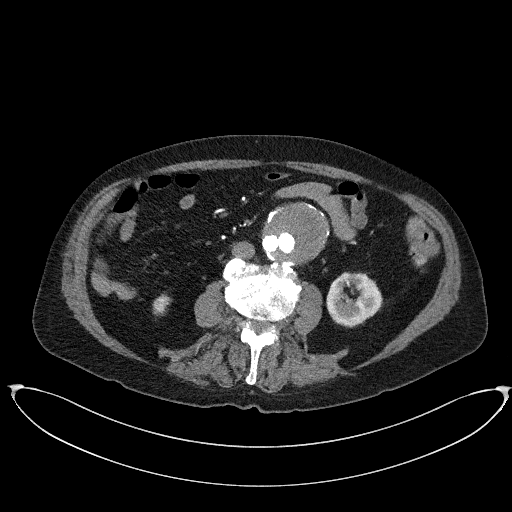
[im 89/178  lung]
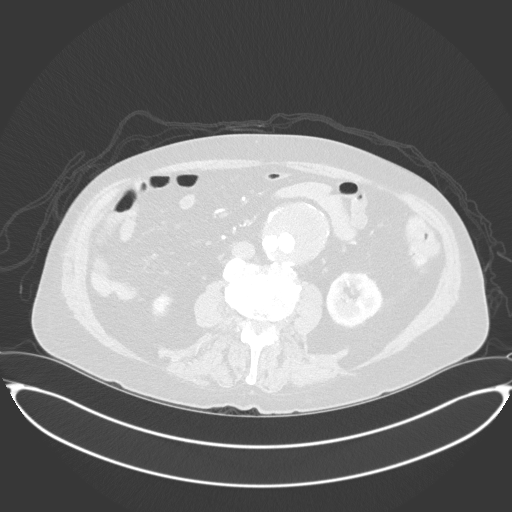
[im 133/178  soft-tissue]
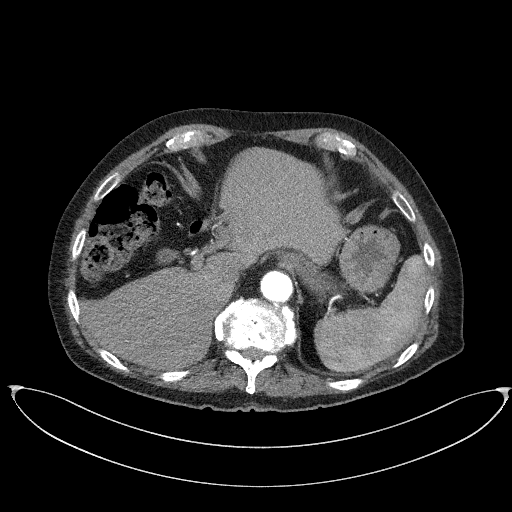
[im 133/178  lung]
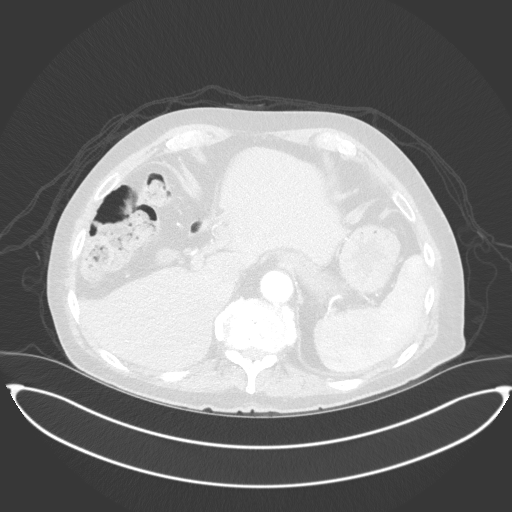

[Series 7: coronals · coronal · 0.91mm/px · 2 of 127 slices shown, 3 images]
[im 43/127  soft-tissue]
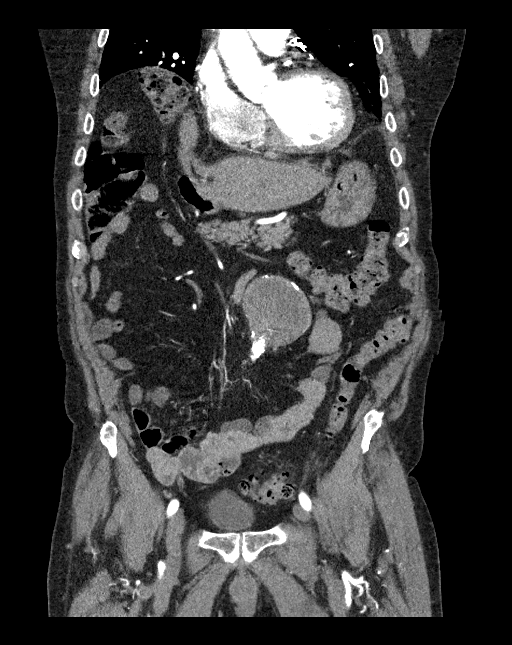
[im 43/127  bone]
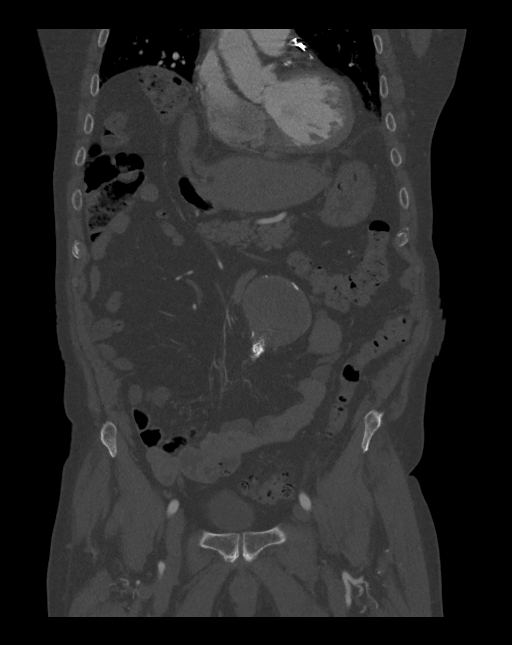
[im 85/127  soft-tissue]
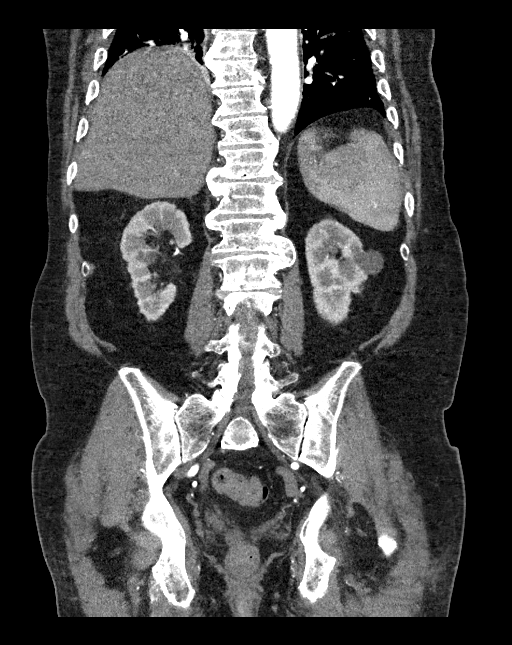

[Series 15: delays 0.6 i41f 2 · axial · 0.88mm/px · z∈[+1022,+1180]mm · 6 of 889 slices shown]
[im 81/889  soft-tissue]
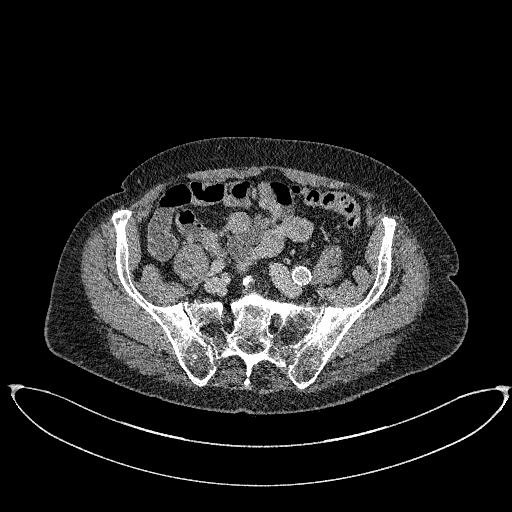
[im 162/889  soft-tissue]
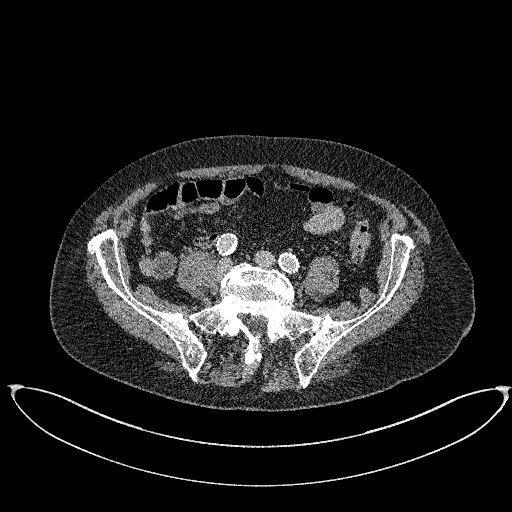
[im 283/889  soft-tissue]
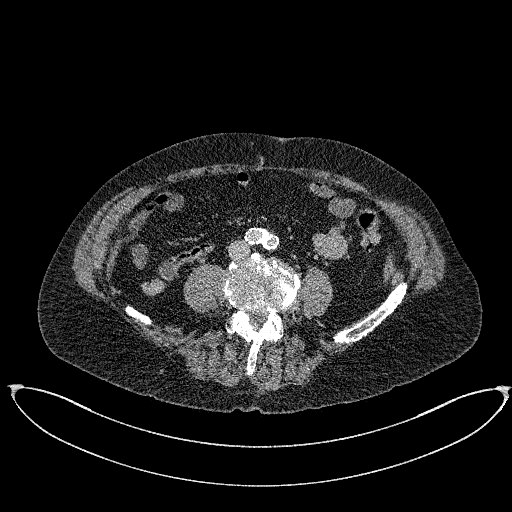
[im 404/889  soft-tissue]
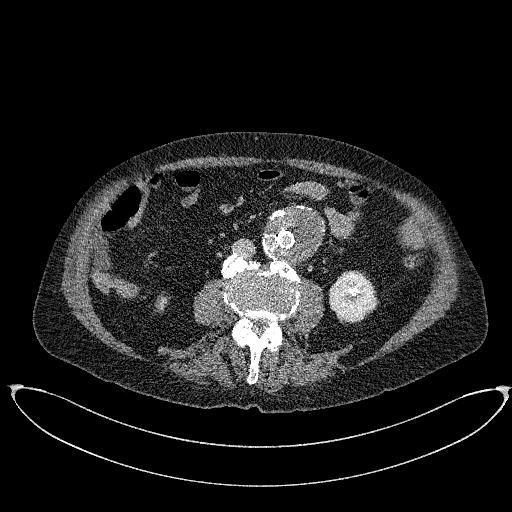
[im 485/889  soft-tissue]
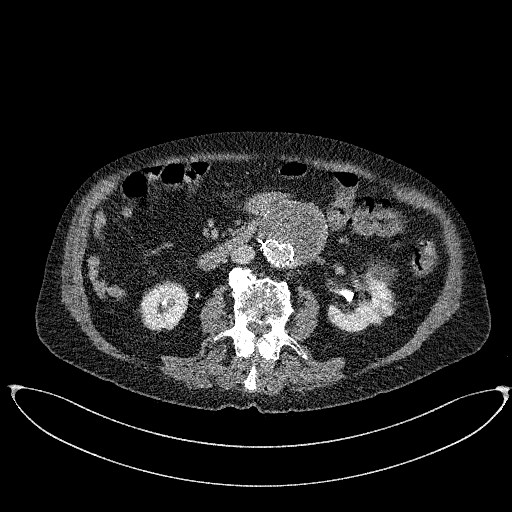
[im 606/889  soft-tissue]
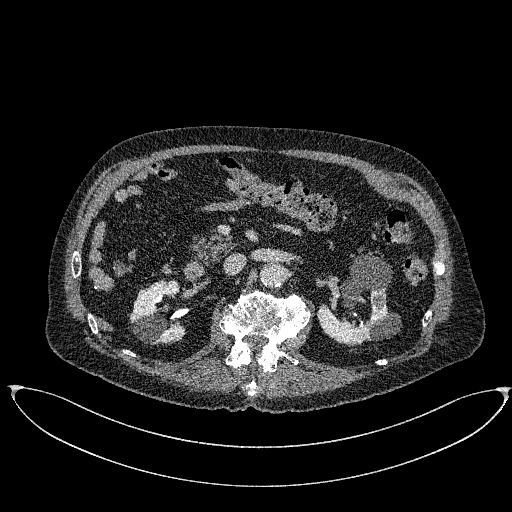

[11 of 46 positions shown; findings below may reference images not displayed]

FINDINGS: VASCULAR

Aorta: Mild scattered calcified plaque in the visualized distal
descending thoracic and suprarenal segments.

Patent infrarenal bifurcated stent graft. Small endoleak is noted
posteriorly just inferior to the graft bifurcation. Native sac
diameter 6.3 x5.9 cm, previously 6.1 x 5.5 cm by my measurements. No
periaortic hemorrhage.

Celiac: Partially calcified ostial plaque resulting in short segment
stenosis of at least mild severity, patent distally.

SMA: Patent without evidence of aneurysm, dissection, vasculitis or
significant stenosis.

Renals: Single left renal artery with ostial plaque resulting in
short segment stenosis of at least moderate severity, patent
distally.

Single right renal artery with mild ostial plaque, no high-grade
stenosis.

IMA: Diminutive. No definite enhancement in the proximal segment
just beyond the origin, with reconstitution distally by visceral
collaterals.

Inflow: Left limb of the stent graft is patent extends to the distal
common iliac, well apposed. Mild tortuosity of the native left
external and internal iliac arteries without aneurysm, dissection,
or stenosis.

Right limb of the stent graft extends to the distal common iliac,
well apposed. Native internal and external iliac arteries patent,
mildly tortuous.

Proximal Outflow: Bilateral common femoral and visualized portions
of the superficial and profunda femoral arteries are patent without
evidence of aneurysm, dissection, vasculitis or significant
stenosis.

Veins: No obvious venous abnormality within the limitations of this
arterial phase study. Patent renal veins, IVC, portal vein noted on
delayed scans.

Review of the MIP images confirms the above findings.

NON-VASCULAR

Lower chest: Previous CABG.  No pleural or pericardial effusion.

Hepatobiliary: No focal liver abnormality is seen. No gallstones,
gallbladder wall thickening, or biliary dilatation.

Pancreas: Unremarkable. No pancreatic ductal dilatation or
surrounding inflammatory changes.

Spleen: Normal in size without focal abnormality.

Adrenals/Urinary Tract: Normal adrenals. Bilateral renal cysts. No
hydronephrosis. No urolithiasis. Urinary bladder incompletely
distended.

Stomach/Bowel: Stomach and small bowel decompressed. Appendix not
identified. Proximal colon interposed between the liver and the
anterior abdominal wall . Multiple distal descending and sigmoid
diverticula without adjacent inflammatory/ edematous change.

Lymphatic: No abdominal or pelvic adenopathy localized.

Reproductive: Marked prostatic enlargement with central coarse
calcifications.

Other: No ascites.  No free air.  No retroperitoneal hemorrhage.

Musculoskeletal: Previous median sternotomy. Spondylitic changes
throughout the visualized lower thoracic and lumbar spine.
IMPRESSION: VASCULAR

1. Patent infrarenal aortic stent graft with persistent endoleak,
possibly type 3 given recent negative selective arteriography.
2. Progressive enlargement of infrarenal aortic aneurysm sac to
x 5.9 cm, previously 6.1 x 5.5.
3. Left renal artery ostial stenosis

NON-VASCULAR

1. No acute findings.
2. Descending and sigmoid diverticulosis.
3. Prostatic enlargement

## 2019-03-29 ENCOUNTER — Telehealth: Payer: Self-pay | Admitting: Cardiovascular Disease

## 2019-03-29 NOTE — Telephone Encounter (Signed)
Returned call to wife.  Per wife Pt went to see his PCP today with c/o SOB off and on over the last couple of weeks.  Per wife the PCP states that he hears some crackling in his lungs and thinks it's heart failure.  Per wife PCP advised he could do lab work at their office which would require days to come back-but would rather Pt go to ER for blood work.  Per wife, Pt and wife do not want to go to ER.  Reviewed Pt last OV note with Dr. Angelena Form.  Pt has PRN lasix to take if needed.    Discussed with wife-Pt will take his lasix today and tomorrow to see if improvement in his breathing.  If no improvement by Friday she will call office and make appointment for the following week for check up.  Advised to go to ER if Pt has worsening SOB-advised not to hesitate to go to ER for urgent evaluation.    Wife thanked nurse for call and agrees to call 911 if needed and will call Friday if SOB has not resolved.

## 2019-03-29 NOTE — Telephone Encounter (Signed)
New Message   Patients wife is calling on his behalf. She states that the patient went to the PCP today because of sob. The PCP advised the patient to go to the ER but the patient does not want to go.   Pt c/o Shortness Of Breath: STAT if SOB developed within the last 24 hours or pt is noticeably SOB on the phone  1. Are you currently SOB (can you hear that pt is SOB on the phone)? Yes, I did not speak to the patient I spoke with the patients wife  2. How long have you been experiencing SOB? A few weeks, but it comes and goes  3. Are you SOB when sitting or when up moving around? Both sitting and moving   4. Are you currently experiencing any other symptoms? Sometime he has nausea but not constantly

## 2019-06-15 ENCOUNTER — Telehealth: Payer: Self-pay | Admitting: Cardiovascular Disease

## 2019-06-15 NOTE — Telephone Encounter (Signed)
New message  Patient has covid and patient's wife has a question that she would like to ask Dr. Camillia Herter nurse about it. Please give Jonathan Arroyo a call back.

## 2019-06-15 NOTE — Telephone Encounter (Signed)
Patient and his wife both have covid. Had first vaccine almost 2 weeks ago. His symptoms started yesterday, fever, doesn't feel great but okay.  They have a pulse ox.  He is staying mid 90s. Getting vit c, D3 and zinc.  She is worried about his heart and any ill effects from Covid and understands to continue to monitor his O2 level and symptoms and report to PCP any concerns right away.  She wonders if he would be a candidate for monoclonal antibody infusion.  She will contact PCP tomorrow and the health department re; when to plan to get second vaccine, since he is symptomatic with covid and scheduled to get 2nd vaccine on Feb 6.

## 2019-09-04 ENCOUNTER — Other Ambulatory Visit: Payer: Self-pay | Admitting: Cardiovascular Disease

## 2019-10-09 ENCOUNTER — Other Ambulatory Visit: Payer: Self-pay

## 2019-10-09 ENCOUNTER — Encounter: Payer: Self-pay | Admitting: Cardiovascular Disease

## 2019-10-09 ENCOUNTER — Ambulatory Visit: Payer: Medicare Other | Admitting: Cardiovascular Disease

## 2019-10-09 VITALS — BP 124/70 | HR 63 | Ht 74.0 in | Wt 180.0 lb

## 2019-10-09 DIAGNOSIS — I25708 Atherosclerosis of coronary artery bypass graft(s), unspecified, with other forms of angina pectoris: Secondary | ICD-10-CM

## 2019-10-09 DIAGNOSIS — I255 Ischemic cardiomyopathy: Secondary | ICD-10-CM

## 2019-10-09 DIAGNOSIS — I10 Essential (primary) hypertension: Secondary | ICD-10-CM | POA: Diagnosis not present

## 2019-10-09 DIAGNOSIS — I5022 Chronic systolic (congestive) heart failure: Secondary | ICD-10-CM

## 2019-10-09 DIAGNOSIS — I491 Atrial premature depolarization: Secondary | ICD-10-CM

## 2019-10-09 MED ORDER — NITROGLYCERIN 0.4 MG SL SUBL: 0.4000 mg | SUBLINGUAL_TABLET | SUBLINGUAL | 3 refills | Status: DC | PRN

## 2019-10-09 NOTE — Patient Instructions (Signed)
Medication Instructions:  No changes *If you need a refill on your cardiac medications before your next appointment, please call your pharmacy*   Lab Work: none If you have labs (blood work) drawn today and your tests are completely normal, you will receive your results only by: . MyChart Message (if you have MyChart) OR . A paper copy in the mail If you have any lab test that is abnormal or we need to change your treatment, we will call you to review the results.   Testing/Procedures: none   Follow-Up: At [facility], you and your health needs are our priority.  As part of our continuing mission to provide you with exceptional heart care, we have created designated Provider Care Teams.  These Care Teams include your primary Cardiologist (physician) and Advanced Practice Providers (APPs -  Physician Assistants and Nurse Practitioners) who all work together to provide you with the care you need, when you need it.  We recommend signing up for the patient portal called "MyChart".  Sign up information is provided on this After Visit Summary.  MyChart is used to connect with patients for Virtual Visits (Telemedicine).  Patients are able to view lab/test results, encounter notes, upcoming appointments, etc.  Non-urgent messages can be sent to your provider as well.   To learn more about what you can do with MyChart, go to [email or web address].    Your next appointment:   6 month(s)  The format for your next appointment:   In Person  Provider:   You may see Christopher McAlhany, MD or one of the following Advanced Practice Providers on your designated Care Team:    Dayna Dunn, PA-C  Michele Lenze, PA-C  Other Instructions   

## 2019-10-09 NOTE — Progress Notes (Signed)
Chief Complaint  Patient presents with  . Follow-up    CAD   History of Present Illness: 86 yo male with history of HLD, HTN, GERD, CAD s/p CABG (1994 redo in 2005), ischemic cardiomyopathy, MR, hiatal hernia, DVT who is here today for cardiac follow up. Redo CABG 07/04/03 with right radial graft to PDA, SVG to Circumflex. His LIMA to LAD was patent by cath. His AAA is followed by Dr. Oneida Alar in [facility]. He was admitted November 2014 with chest pain c/w angina. Cardiac cath 04/10/13 and found to have patent LIMA to LAD, patent free radial graft to PDA but occluded SVG to Circumflex system with 99% distal left main stenosis supplying the Circumflex as well as severe distal Circumflex stenosis. A Promus Premier drug eluting stent was placed in the distal Circumflex extending into the second OM branch. A Promus Premier drug eluting stent was placed in the distal left main extending into the proximal Circumflex.  Aortic stent graft placed October 2015 by Dr. Oneida Alar. He was seen in our office March 2017 by Ellen Henri, PA-C and had weight gain and dyspnea which responded to Lasix. Echo April 2017 with LVEF=45-50%, moderate MR. He was diagnosed with a PE in December 2017 and was on Xarelto which has been stopped. He was seen in our office 03/02/17 by Lyda Jester, PA-C and had c/o worsened dyspnea with some chest pain. Workup in primary care with elevated d-dimer but CTA chest negative for PE. Nuclear stress test 03/18/17 with inferior, inferolateral and apical scar but no ischemia. I saw him back in the office 03/19/17 and he was feeling well. He did not wish to pursue a repeat cardiac cath. I saw him in January 2019 and he c/o dyspnea. He refused cath. Lasix was started. He was seen in our office 06/11/17 by Melina Copa, PA and there was concern for a stroke given new visual changes. He refused admission. He saw neurology who felt that he had a TIA. He refused to take Plavix due to cost. He was seen again  06/15/17 in our office by Estella Husk, PA and c/o ongoing dyspnea with exertional chest pain. Imdur was added. He continued to have dyspnea and chest pain. Cardiac cath February 2019 showed severe native vessel CAD with occlusion of the ostial LAD and ostial RCA. The LIMA to the LAD is patent. The free radial graft to the PDA is patent. The Left main/Circumflex stent is patent. The OM2 stent is patent. Echo November 2018 with LVEF=50-55%. Mild MR. Loop recorder recommended in January 2019 by Neurology post TIA but he missed his appt with our EP team in march 2019 and did not return phone calls to reschedule this.   He is here today for follow up. The patient denies any palpitations, lower extremity edema, orthopnea, PND, dizziness, near syncope or syncope. He has ongoing balance issues. He has dyspnea which is unchanged. He has occasional chest pains.  He had Covid several months ago.    Primary Care Physician: Raina Mina., MD  Past Medical History:  Diagnosis Date  . Abdominal aneurysm without mention of rupture    a. s/p stent graft 2015 by [facility].  . Acute venous embolism and thrombosis of unspecified deep vessels of lower extremity   . Anxiety   . CAD (coronary artery disease)    a. s/p CABG 1994, redo 2005. b. [place] 03/2013: 2/3 patent bpg. s/p PTCA/DES to distal LM and PTCA/DES to distal LCx.  . Chronic combined  systolic and diastolic CHF (congestive heart failure) ([place])   . Esophageal reflux   . Hiatal hernia    HISTORY  . HTN (hypertension)   . Hyperlipidemia   . Ischemic cardiomyopathy    a. EF history: 2011: 35-40%, 11/2012: 45-50%. b. 03/2013: 35% by cath.  . Mild mitral regurgitation   . Myocardial infarction ([place])   . Other specified gastritis without mention of hemorrhage   . Premature atrial contractions   . Pulmonary embolism ([place]) 04/2016  . PVC's (premature ventricular contractions)   . Renal calculus    history  . Spinal stenosis, unspecified region  other than cervical   . Stomach pain June 2013   Hospital stay at [facility]  . Stroke [facility])     Past Surgical History:  Procedure Laterality Date  . ABDOMINAL AORTIC ANEURYSM REPAIR    . ABDOMINAL AORTIC ENDOVASCULAR STENT GRAFT N/A 03/14/2014   Procedure: ABDOMINAL AORTIC ENDOVASCULAR STENT GRAFT;  Surgeon: Elam Dutch, MD;  Location: [facility];  Service: Vascular;  Laterality: N/A;  . BACK SURGERY    . CORONARY ANGIOPLASTY WITH STENT PLACEMENT  04/10/2013   DES LEFT MAIN DES LEFT CIRCUMFLEX    DR [place]  . CORONARY ARTERY BYPASS GRAFT  1994 and  2005  . HIATAL HERNIA REPAIR    . IR GENERIC HISTORICAL  07/01/2016   IR RADIOLOGIST EVAL & MGMT 07/01/2016 Jacqulynn Cadet, MD [facility] INTER[facility]  . IR GENERIC HISTORICAL  07/24/2016   IR ANGIOGRAM SELECTIVE EACH ADDITIONAL VESSEL 07/24/2016 Jacqulynn Cadet, MD [facility]  . IR GENERIC HISTORICAL  07/24/2016   IR ANGIOGRAM SELECTIVE EACH ADDITIONAL VESSEL 07/24/2016 Jacqulynn Cadet, MD [facility]  . IR GENERIC HISTORICAL  07/24/2016   IR ANGIOGRAM VISCERAL SELECTIVE 07/24/2016 Jacqulynn Cadet, MD [facility]  . IR GENERIC HISTORICAL  07/24/2016   IR US GUIDE VASC ACCESS RIGHT 07/24/2016 Jacqulynn Cadet, MD [facility]  . IR GENERIC HISTORICAL  07/24/2016   IR ANGIOGRAM SELECTIVE EACH ADDITIONAL VESSEL 07/24/2016 Jacqulynn Cadet, MD [facility]  . IR GENERIC HISTORICAL  07/24/2016   IR ANGIOGRAM PELVIS SELECTIVE OR SUPRASELECTIVE 07/24/2016 Jacqulynn Cadet, MD [facility]  . IR GENERIC HISTORICAL  07/24/2016   IR ANGIOGRAM SELECTIVE EACH ADDITIONAL VESSEL 07/24/2016 Jacqulynn Cadet, MD [facility]  . IR GENERIC HISTORICAL  07/24/2016   IR EMBO ARTERIAL NOT HEMORR HEMANG INC GUIDE ROADMAPPING 07/24/2016 Jacqulynn Cadet, MD [facility]  . IR RADIOLOGIST EVAL & MGMT  08/27/2016  . IR RADIOLOGIST EVAL & MGMT  02/09/2017  . JOINT REPLACEMENT  2005   Right knee  . LEFT HEART CATHETERIZATION WITH CORONARY ANGIOGRAM N/A 04/10/2013   Procedure: LEFT HEART  CATHETERIZATION WITH CORONARY ANGIOGRAM;  Surgeon: Burnell Blanks, MD;  Location: [facility];  Service: Cardiovascular;  Laterality: N/A;  . PTCA    . RIGHT/LEFT HEART CATH AND CORONARY/GRAFT ANGIOGRAPHY N/A 07/02/2017   Procedure: RIGHT/LEFT HEART CATH AND CORONARY/GRAFT ANGIOGRAPHY;  Surgeon: Burnell Blanks, MD;  Location: [facility] CV LAB;  Service: Cardiovascular;  Laterality: N/A;  . ROTATOR CUFF REPAIR Left   . SPINE SURGERY     X's 3  . TOTAL KNEE ARTHROPLASTY     right    Current Outpatient Medications  Medication Sig Dispense Refill  . aspirin EC 325 MG tablet Take 1 tablet (325 mg total) by mouth daily. 30 tablet 0  . busPIRone (BUSPAR) 10 MG tablet Take 10 mg by mouth 2 (two) times daily as needed (for anxiety).     Marland Kitchen  carvedilol (COREG) 6.25 MG tablet TAKE 1 TABLET BY MOUTH TWICE DAILY. *FOR HEART* 180 tablet 0  . finasteride (PROSCAR) 5 MG tablet Take 5 mg by mouth daily.      . furosemide (LASIX) 20 MG tablet Take 20 mg by mouth daily as needed for fluid.    Marland Kitchen gabapentin (NEURONTIN) 300 MG capsule Take 1 capsule by mouth daily.    Marland Kitchen lisinopril (PRINIVIL,ZESTRIL) 40 MG tablet Take 20 mg by mouth 2 (two) times daily.     . meclizine (ANTIVERT) 12.5 MG tablet Take 12.5 mg by mouth 3 (three) times daily as needed for dizziness.    . nitroGLYCERIN (NITROSTAT) 0.4 MG SL tablet Place 1 tablet (0.4 mg total) under the tongue every 5 (five) minutes as needed for chest pain (MAX 3 TABLETS). 25 tablet 3  . polyethylene glycol (MIRALAX / GLYCOLAX) packet Take 17 g by mouth daily as needed for mild constipation.    . simvastatin (ZOCOR) 40 MG tablet Take 40 mg by mouth at bedtime.     . traMADol (ULTRAM) 50 MG tablet Take 50 mg by mouth 2 (two) times daily as needed for moderate pain.     Marland Kitchen zolpidem (AMBIEN) 10 MG tablet Take 10 mg by mouth at bedtime.      No current facility-administered medications for this visit.    Allergies  Allergen Reactions  . Alum & Mag  Hydroxide-Simeth Other (See Comments)    Unknown (Mylanta)  . Oxycodone-Acetaminophen Other (See Comments)    unknown    Social History   Socioeconomic History  . Marital status: Married    Spouse name: Not on file  . Number of children: Not on file  . Years of education: Not on file  . Highest education level: Not on file  Occupational History  . Not on file  Tobacco Use  . Smoking status: Never Smoker  . Smokeless tobacco: Never Used  Substance and Sexual Activity  . Alcohol use: No    Alcohol/week: 0.0 standard drinks  . Drug use: No  . Sexual activity: Not on file  Other Topics Concern  . Not on file  Social History Narrative  . Not on file   Social Determinants of Health   Financial Resource Strain:   . Difficulty of Paying Living Expenses:   Food Insecurity:   . Worried About Charity fundraiser in the Last Year:   . Arboriculturist in the Last Year:   Transportation Needs:   . Film/video editor (Medical):   Marland Kitchen Lack of Transportation (Non-Medical):   Physical Activity:   . Days of Exercise per Week:   . Minutes of Exercise per Session:   Stress:   . Feeling of Stress :   Social Connections:   . Frequency of Communication with Friends and Family:   . Frequency of Social Gatherings with Friends and Family:   . Attends Religious Services:   . Active Member of Clubs or Organizations:   . Attends Archivist Meetings:   Marland Kitchen Marital Status:   Intimate Partner Violence:   . Fear of Current or Ex-Partner:   . Emotionally Abused:   Marland Kitchen Physically Abused:   . Sexually Abused:     Family History  Problem Relation Age of Onset  . Coronary artery disease Mother   . Diabetes Mother   . Heart disease Mother   . Hyperlipidemia Mother   . Hypertension Mother   . Heart attack Mother   .  Heart disease Father   . Hyperlipidemia Father   . Hypertension Father   . Heart attack Father   . Stroke Neg Hx     Review of Systems:  As stated in the HPI and  otherwise negative.   BP 124/70   Pulse 63   Ht 6\' 2"  (1.88 m)   Wt 180 lb (81.6 kg)   SpO2 97%   BMI 23.11 kg/m   Physical Examination:  General: Well developed, well nourished, NAD  HEENT: OP clear, mucus membranes moist  SKIN: warm, dry. No rashes. Neuro: No focal deficits  Musculoskeletal: Muscle strength 5/5 all ext  Psychiatric: Mood and affect normal  Neck: No JVD, no carotid bruits, no thyromegaly, no lymphadenopathy.  Lungs:Clear bilaterally, no wheezes, rhonci, crackles Cardiovascular: Regular rate and rhythm. No murmurs, gallops or rubs. Abdomen:Soft. Bowel sounds present. Non-tender.  Extremities: No lower extremity edema. Pulses are 2 + in the bilateral DP/PT.  Echo 03/18/17: - Left ventricle: Abnormal septal motion The cavity size was   normal. Wall thickness was increased in a pattern of severe LVH.   Systolic function was normal. The estimated ejection fraction was   in the range of 50% to 55%. - Mitral valve: Calcified annulus. Mildly thickened leaflets .   There was mild regurgitation. - Left atrium: The atrium was moderately dilated. - Atrial septum: No defect or patent foramen ovale was identified.  EKG:  EKG is ordered today. The ekg ordered today demonstrates sinus with PACs  Recent Labs: No results found for requested labs within last 8760 hours.   Lipid Panel    Component Value Date/Time   CHOL 130 06/15/2017 1042   TRIG 168 (H) 06/15/2017 1042   HDL 33 (L) 06/15/2017 1042   CHOLHDL 3.9 06/15/2017 1042   LDLCALC 63 06/15/2017 1042     Wt Readings from Last 3 Encounters:  10/09/19 180 lb (81.6 kg)  07/18/18 186 lb 1.9 oz (84.4 kg)  03/23/18 188 lb (85.3 kg)     Other studies Reviewed: Additional studies/ records that were reviewed today include: . Review of the above records demonstrates:    Assessment and Plan:   1. CAD with stable angina: He is s/p CABG. Last cath February 2019 with 2 patent bypass grafts and left main stent. No  chest pain. Continue ASA, statin and beta blocker.   2. PACs, PVCs: No palpitations. Continue beta blocker  3. Aortic valve insufficiency: Trivial by echo November 2018  4. Ischemic CM: Last LVEF=50-55% by echo 03/18/17. Continue beta blocker and Ace-inh.   5. Chronic Systolic CHF: Weight is stable. No volume overload on exam. He had no improvement in dyspnea on Lasix so it was stopped in 2019.    6. Hypertension: BP is controlled.    7. Hyperlipidemia: Lipids followed in primary care. Continue statin.    8. AAA: Followed by [facility] and now s/p aortic stent graft. He has been seen by IR for endoleak.   9. Mitral Regurgitation: Mild by echo 03/18/17.   10. TIA: He has been seen by Dr. Leonie Man in neurology. There had been a recommendation for a loop recorder in February 2019. He missed his appt to see Dr. Lennie Odor in march 2019 and then did not return calls from our office to set up this visit to plan the loop recorder.    Current medicines are reviewed at length with the patient today.  The patient does not have concerns regarding medicines.  The following changes have been made:  no change  Labs/ tests ordered today include:   Orders Placed This Encounter  Procedures  . EKG 12-Lead   Disposition:   FU with me in 6 months  Signed, Lauree Chandler, MD 10/09/2019 10:41 AM    [facility] [address], [place], [place]  [address] Phone: [phone]; Fax: [phone]

## 2019-12-01 ENCOUNTER — Other Ambulatory Visit: Payer: Self-pay | Admitting: Cardiovascular Disease

## 2020-08-06 ENCOUNTER — Telehealth: Payer: Self-pay | Admitting: Cardiovascular Disease

## 2020-08-06 NOTE — Telephone Encounter (Signed)
Called pt and received message stating patient hadn't set up voicemail box, and the phone disconnected. Manuela Schwartz RN

## 2020-08-06 NOTE — Telephone Encounter (Signed)
Pt c/o of Chest Pain: STAT if CP now or developed within 24 hours  1. Are you having CP right now? No   2. Are you experiencing any other symptoms (ex. SOB, nausea, vomiting, sweating)? SOB  3. How long have you been experiencing CP? A few weeks  4. Is your CP continuous or coming and going? Coming and going  5. Have you taken Nitroglycerin? No  ?

## 2020-08-07 NOTE — Telephone Encounter (Addendum)
Unable to leave message on cell phone (preferred number). Left message on pt home number to call office back

## 2020-08-09 NOTE — Telephone Encounter (Signed)
I spoke with the patient. He had had that one episode of chest pain, lasted "a good while" and then he took one ntg which was effective.  We discussed that he has had 2 open heart surgeries in the past.  He had no accompanying symptoms, and said he is "not 100 % sure it was my heart anyway."  We reviewed ntg use and I asked him to call back if he is getting more frequent or worsening chest pains.  Scheduled his overdue 6 month follow up with Dr. Angelena Form.  They only have transportation on Wednesdays.

## 2020-08-21 ENCOUNTER — Ambulatory Visit: Payer: Medicare Other | Admitting: Cardiovascular Disease

## 2020-08-21 NOTE — Progress Notes (Deleted)
No chief complaint on file.  History of Present Illness: 87 yo male with history of HLD, HTN, GERD, CAD s/p CABG (1994 redo in 2005), ischemic cardiomyopathy, MR, hiatal hernia, DVT who is here today for cardiac follow up. Redo CABG 07/04/03 with right radial graft to PDA, SVG to Circumflex. His LIMA to LAD was patent by cath. His AAA is followed by Dr. Oneida Alar in [facility]. He was admitted November 2014 with chest pain c/w angina. Cardiac cath 04/10/13 and found to have patent LIMA to LAD, patent free radial graft to PDA but occluded SVG to Circumflex system with 99% distal left main stenosis supplying the Circumflex as well as severe distal Circumflex stenosis. A Promus Premier drug eluting stent was placed in the distal Circumflex extending into the second OM branch. A Promus Premier drug eluting stent was placed in the distal left main extending into the proximal Circumflex.  Aortic stent graft placed October 2015 by Dr. Oneida Alar. He was seen in our office March 2017 by Ellen Henri, PA-C and had weight gain and dyspnea which responded to Lasix. Echo April 2017 with LVEF=45-50%, moderate MR. He was diagnosed with a PE in December 2017 and was on Xarelto which has been stopped. He was seen in our office 03/02/17 by Lyda Jester, PA-C and had c/o worsened dyspnea with some chest pain. Workup in primary care with elevated d-dimer but CTA chest negative for PE. Nuclear stress test 03/18/17 with inferior, inferolateral and apical scar but no ischemia. I saw him back in the office 03/19/17 and he was feeling well. He did not wish to pursue a repeat cardiac cath. I saw him in January 2019 and he c/o dyspnea. He refused cath. Lasix was started. He was seen in our office 06/11/17 by Melina Copa, PA and there was concern for a stroke given new visual changes. He refused admission. He saw neurology who felt that he had a TIA. He refused to take Plavix due to cost. He was seen again 06/15/17 in our office by Estella Husk,  PA and c/o ongoing dyspnea with exertional chest pain. Imdur was added. He continued to have dyspnea and chest pain. Cardiac cath February 2019 showed severe native vessel CAD with occlusion of the ostial LAD and ostial RCA. The LIMA to the LAD is patent. The free radial graft to the PDA is patent. The Left main/Circumflex stent is patent. The OM2 stent is patent. Echo November 2018 with LVEF=50-55%. Mild MR. Loop recorder recommended in January 2019 by Neurology post TIA but he missed his appt with our EP team in march 2019 and did not return phone calls to reschedule this.   He is here today for follow up. The patient denies any chest pain, dyspnea, palpitations, lower extremity edema, orthopnea, PND, dizziness, near syncope or syncope.    Primary Care Physician: Raina Mina., MD  Past Medical History:  Diagnosis Date  . Abdominal aneurysm without mention of rupture    a. s/p stent graft 2015 by [facility].  . Acute venous embolism and thrombosis of unspecified deep vessels of lower extremity   . Anxiety   . CAD (coronary artery disease)    a. s/p CABG 1994, redo 2005. b. [place] 03/2013: 2/3 patent bpg. s/p PTCA/DES to distal LM and PTCA/DES to distal LCx.  . Chronic combined systolic and diastolic CHF (congestive heart failure) ([place])   . Esophageal reflux   . Hiatal hernia    HISTORY  . HTN (hypertension)   .  Hyperlipidemia   . Ischemic cardiomyopathy    a. EF history: 2011: 35-40%, 11/2012: 45-50%. b. 03/2013: 35% by cath.  . Mild mitral regurgitation   . Myocardial infarction ([place])   . Other specified gastritis without mention of hemorrhage   . Premature atrial contractions   . Pulmonary embolism ([place]) 04/2016  . PVC's (premature ventricular contractions)   . Renal calculus    history  . Spinal stenosis, unspecified region other than cervical   . Stomach pain June 2013   Hospital stay at [facility]  . Stroke [facility])     Past Surgical History:  Procedure Laterality Date  .  ABDOMINAL AORTIC ANEURYSM REPAIR    . ABDOMINAL AORTIC ENDOVASCULAR STENT GRAFT N/A 03/14/2014   Procedure: ABDOMINAL AORTIC ENDOVASCULAR STENT GRAFT;  Surgeon: Elam Dutch, MD;  Location: [place];  Service: Vascular;  Laterality: N/A;  . BACK SURGERY    . CORONARY ANGIOPLASTY WITH STENT PLACEMENT  04/10/2013   DES LEFT MAIN DES LEFT CIRCUMFLEX    DR [place]  . CORONARY ARTERY BYPASS GRAFT  1994 and  2005  . HIATAL HERNIA REPAIR    . IR GENERIC HISTORICAL  07/01/2016   IR RADIOLOGIST EVAL & MGMT 07/01/2016 Jacqulynn Cadet, MD [facility] INTERV RAD  . IR GENERIC HISTORICAL  07/24/2016   IR ANGIOGRAM SELECTIVE EACH ADDITIONAL VESSEL 07/24/2016 Jacqulynn Cadet, MD [facility]  . IR GENERIC HISTORICAL  07/24/2016   IR ANGIOGRAM SELECTIVE EACH ADDITIONAL VESSEL 07/24/2016 Jacqulynn Cadet, MD [facility]  . IR GENERIC HISTORICAL  07/24/2016   IR ANGIOGRAM VISCERAL SELECTIVE 07/24/2016 Jacqulynn Cadet, MD [facility]  . IR GENERIC HISTORICAL  07/24/2016   IR US GUIDE VASC ACCESS RIGHT 07/24/2016 Jacqulynn Cadet, MD [facility]  . IR GENERIC HISTORICAL  07/24/2016   IR ANGIOGRAM SELECTIVE EACH ADDITIONAL VESSEL 07/24/2016 Jacqulynn Cadet, MD [facility]  . IR GENERIC HISTORICAL  07/24/2016   IR ANGIOGRAM PELVIS SELECTIVE OR SUPRASELECTIVE 07/24/2016 Jacqulynn Cadet, MD [facility]  . IR GENERIC HISTORICAL  07/24/2016   IR ANGIOGRAM SELECTIVE EACH ADDITIONAL VESSEL 07/24/2016 Jacqulynn Cadet, MD [facility]  . IR GENERIC HISTORICAL  07/24/2016   IR EMBO ARTERIAL NOT HEMORR HEMANG INC GUIDE ROADMAPPING 07/24/2016 Jacqulynn Cadet, MD [facility]  . IR RADIOLOGIST EVAL & MGMT  08/27/2016  . IR RADIOLOGIST EVAL & MGMT  02/09/2017  . JOINT REPLACEMENT  2005   Right knee  . LEFT HEART CATHETERIZATION WITH CORONARY ANGIOGRAM N/A 04/10/2013   Procedure: LEFT HEART CATHETERIZATION WITH CORONARY ANGIOGRAM;  Surgeon: Burnell Blanks, MD;  Location: [facility] CATH LAB;  Service: Cardiovascular;  Laterality: N/A;  .  PTCA    . RIGHT/LEFT HEART CATH AND CORONARY/GRAFT ANGIOGRAPHY N/A 07/02/2017   Procedure: RIGHT/LEFT HEART CATH AND CORONARY/GRAFT ANGIOGRAPHY;  Surgeon: Burnell Blanks, MD;  Location: [facility] CV LAB;  Service: Cardiovascular;  Laterality: N/A;  . ROTATOR CUFF REPAIR Left   . SPINE SURGERY     X's 3  . TOTAL KNEE ARTHROPLASTY     right    Current Outpatient Medications  Medication Sig Dispense Refill  . aspirin EC 325 MG tablet Take 1 tablet (325 mg total) by mouth daily. 30 tablet 0  . busPIRone (BUSPAR) 10 MG tablet Take 10 mg by mouth 2 (two) times daily as needed (for anxiety).     . carvedilol (COREG) 6.25 MG tablet TAKE 1 TABLET BY MOUTH TWICE DAILY. *FOR HEART* 180 tablet 2  . finasteride (PROSCAR) 5 MG tablet Take 5  mg by mouth daily.      . furosemide (LASIX) 20 MG tablet Take 20 mg by mouth daily as needed for fluid.    Marland Kitchen gabapentin (NEURONTIN) 300 MG capsule Take 1 capsule by mouth daily.    Marland Kitchen lisinopril (PRINIVIL,ZESTRIL) 40 MG tablet Take 20 mg by mouth 2 (two) times daily.     . meclizine (ANTIVERT) 12.5 MG tablet Take 12.5 mg by mouth 3 (three) times daily as needed for dizziness.    . nitroGLYCERIN (NITROSTAT) 0.4 MG SL tablet Place 1 tablet (0.4 mg total) under the tongue every 5 (five) minutes as needed for chest pain (MAX 3 TABLETS). 25 tablet 3  . polyethylene glycol (MIRALAX / GLYCOLAX) packet Take 17 g by mouth daily as needed for mild constipation.    . simvastatin (ZOCOR) 40 MG tablet Take 40 mg by mouth at bedtime.     . traMADol (ULTRAM) 50 MG tablet Take 50 mg by mouth 2 (two) times daily as needed for moderate pain.     Marland Kitchen zolpidem (AMBIEN) 10 MG tablet Take 10 mg by mouth at bedtime.      No current facility-administered medications for this visit.    Allergies  Allergen Reactions  . Alum & Mag Hydroxide-Simeth Other (See Comments)    Unknown (M[place])  . Oxycodone-Acetaminophen Other (See Comments)    unknown    Social History    Socioeconomic History  . Marital status: Married    Spouse name: Not on file  . Number of children: Not on file  . Years of education: Not on file  . Highest education level: Not on file  Occupational History  . Not on file  Tobacco Use  . Smoking status: Never Smoker  . Smokeless tobacco: Never Used  Substance and Sexual Activity  . Alcohol use: No    Alcohol/week: 0.0 standard drinks  . Drug use: No  . Sexual activity: Not on file  Other Topics Concern  . Not on file  Social History Narrative  . Not on file   Social Determinants of Health   Financial Resource Strain: Not on file  Food Insecurity: Not on file  Transportation Needs: Not on file  Physical Activity: Not on file  Stress: Not on file  Social Connections: Not on file  Intimate Partner Violence: Not on file    Family History  Problem Relation Age of Onset  . Coronary artery disease Mother   . Diabetes Mother   . Heart disease Mother   . Hyperlipidemia Mother   . Hypertension Mother   . Heart attack Mother   . Heart disease Father   . Hyperlipidemia Father   . Hypertension Father   . Heart attack Father   . Stroke Neg Hx     Review of Systems:  As stated in the HPI and otherwise negative.   There were no vitals taken for this visit.  Physical Examination:  General: Well developed, well nourished, NAD  HEENT: OP clear, mucus membranes moist  SKIN: warm, dry. No rashes. Neuro: No focal deficits  Musculoskeletal: Muscle strength 5/5 all ext  Psychiatric: Mood and affect normal  Neck: No JVD, no carotid bruits, no thyromegaly, no lymphadenopathy.  Lungs:Clear bilaterally, no wheezes, rhonci, crackles Cardiovascular: Regular rate and rhythm. No murmurs, gallops or rubs. Abdomen:Soft. Bowel sounds present. Non-tender.  Extremities: No lower extremity edema. Pulses are 2 + in the bilateral DP/PT.  Echo 03/18/17: - Left ventricle: Abnormal septal motion The cavity size was  normal. Wall  thickness was increased in a pattern of severe LVH.   Systolic function was normal. The estimated ejection fraction was   in the range of 50% to 55%. - Mitral valve: Calcified annulus. Mildly thickened leaflets .   There was mild regurgitation. - Left atrium: The atrium was moderately dilated. - Atrial septum: No defect or patent foramen ovale was identified.  EKG:  EKG is *** ordered today. The ekg ordered today demonstrates   Recent Labs: No results found for requested labs within last 8760 hours.   Lipid Panel    Component Value Date/Time   CHOL 130 06/15/2017 1042   TRIG 168 (H) 06/15/2017 1042   HDL 33 (L) 06/15/2017 1042   CHOLHDL 3.9 06/15/2017 1042   LDLCALC 63 06/15/2017 1042     Wt Readings from Last 3 Encounters:  10/09/19 180 lb (81.6 kg)  07/18/18 186 lb 1.9 oz (84.4 kg)  03/23/18 188 lb (85.3 kg)     Other studies Reviewed: Additional studies/ records that were reviewed today include: . Review of the above records demonstrates:    Assessment and Plan:   1. CAD with stable angina: He is s/p CABG. Last cath February 2019 with 2 patent bypass grafts and left main stent. He has no chest pain. Will continue ASA, statin and beta blocker.    2. PACs, PVCs: He has no palpitations. Will continue beta blocker.   3. Aortic valve insufficiency: Trivial by echo November 2018  4. Ischemic CM: Last LVEF=50-55% by echo 03/18/17. Will continue Ace-inh and beta blocker.   5. Chronic Systolic CHF: No volume overload no exam. Weight is stable. He had no improvement in dyspnea on Lasix so it was stopped in 2019.    6. Hypertension: BP is well controlled. No changes  7. Hyperlipidemia: Lipids followed in primary care. LDL ***. Continue statin.    8. AAA: Followed by [facility] and now s/p aortic stent graft. He has been seen by IR for endoleak.   9. Mitral Regurgitation: Mild by echo 03/18/17.   10. TIA: He has been seen by Dr. Leonie Man in neurology. There had been a recommendation  for a loop recorder in February 2019. He missed his appt to see Dr. Lennie Odor in march 2019 and then did not return calls from our office to set up this visit to plan the loop recorder.    Current medicines are reviewed at length with the patient today.  The patient does not have concerns regarding medicines.  The following changes have been made:  no change  Labs/ tests ordered today include:   No orders of the defined types were placed in this encounter.  Disposition:   FU with me in 6 months  Signed, Lauree Chandler, MD 08/21/2020 8:38 AM    [facility] [address], [place], [place]  [address] Phone: [phone]; Fax: [phone]

## 2020-09-12 ENCOUNTER — Other Ambulatory Visit: Payer: Self-pay | Admitting: Cardiovascular Disease

## 2021-10-24 ENCOUNTER — Other Ambulatory Visit: Payer: Self-pay

## 2021-10-24 MED ORDER — CARVEDILOL 6.25 MG PO TABS
ORAL_TABLET | ORAL | 0 refills | Status: DC
Start: 2021-10-24 — End: 2021-12-15

## 2021-12-15 ENCOUNTER — Other Ambulatory Visit: Payer: Self-pay

## 2021-12-15 MED ORDER — CARVEDILOL 6.25 MG PO TABS: ORAL_TABLET | ORAL | 0 refills | Status: DC

## 2022-04-02 ENCOUNTER — Other Ambulatory Visit: Payer: Self-pay | Admitting: Cardiovascular Disease

## 2022-04-24 ENCOUNTER — Other Ambulatory Visit: Payer: Self-pay | Admitting: Cardiovascular Disease

## 2022-05-06 ENCOUNTER — Other Ambulatory Visit: Payer: Self-pay | Admitting: Cardiovascular Disease

## 2022-05-06 NOTE — Telephone Encounter (Signed)
Left message for patient to call back.  He was last seenin 09/2019.  PCP note in Oct indicates "no longer sees cardiology"  I asked the patient call back to let us know if he is planning to schedule to come back to cardiology or if he will be having his PCP refill his medications (Coreg).

## 2022-07-29 ENCOUNTER — Other Ambulatory Visit: Payer: Self-pay | Admitting: Cardiovascular Disease

## 2022-10-19 ENCOUNTER — Inpatient Hospital Stay (HOSPITAL_COMMUNITY)
Admission: EM | Admit: 2022-10-19 | Discharge: 2022-10-22 | DRG: 308 | Disposition: A | Discharge status: Home Health | Payer: Medicare Other | Attending: Internal Medicine | Admitting: Internal Medicine

## 2022-10-19 ENCOUNTER — Other Ambulatory Visit: Payer: Self-pay

## 2022-10-19 ENCOUNTER — Emergency Department (HOSPITAL_COMMUNITY): Payer: Medicare Other

## 2022-10-19 DIAGNOSIS — I444 Left anterior fascicular block: Secondary | ICD-10-CM | POA: Diagnosis present

## 2022-10-19 DIAGNOSIS — Z8673 Personal history of transient ischemic attack (TIA), and cerebral infarction without residual deficits: Secondary | ICD-10-CM

## 2022-10-19 DIAGNOSIS — N179 Acute kidney failure, unspecified: Secondary | ICD-10-CM | POA: Diagnosis present

## 2022-10-19 DIAGNOSIS — I130 Hypertensive heart and chronic kidney disease with heart failure and stage 1 through stage 4 chronic kidney disease, or unspecified chronic kidney disease: Secondary | ICD-10-CM | POA: Diagnosis present

## 2022-10-19 DIAGNOSIS — I5043 Acute on chronic combined systolic (congestive) and diastolic (congestive) heart failure: Secondary | ICD-10-CM | POA: Diagnosis present

## 2022-10-19 DIAGNOSIS — I5033 Acute on chronic diastolic (congestive) heart failure: Secondary | ICD-10-CM | POA: Diagnosis present

## 2022-10-19 DIAGNOSIS — I4891 Unspecified atrial fibrillation: Principal | ICD-10-CM | POA: Diagnosis present

## 2022-10-19 DIAGNOSIS — I509 Heart failure, unspecified: Secondary | ICD-10-CM

## 2022-10-19 DIAGNOSIS — I480 Paroxysmal atrial fibrillation: Secondary | ICD-10-CM | POA: Diagnosis not present

## 2022-10-19 DIAGNOSIS — R0602 Shortness of breath: Secondary | ICD-10-CM | POA: Diagnosis not present

## 2022-10-19 DIAGNOSIS — Z8349 Family history of other endocrine, nutritional and metabolic diseases: Secondary | ICD-10-CM

## 2022-10-19 DIAGNOSIS — I252 Old myocardial infarction: Secondary | ICD-10-CM

## 2022-10-19 DIAGNOSIS — Z7982 Long term (current) use of aspirin: Secondary | ICD-10-CM

## 2022-10-19 DIAGNOSIS — Z79899 Other long term (current) drug therapy: Secondary | ICD-10-CM

## 2022-10-19 DIAGNOSIS — I051 Rheumatic mitral insufficiency: Secondary | ICD-10-CM | POA: Diagnosis present

## 2022-10-19 DIAGNOSIS — Z91048 Other nonmedicinal substance allergy status: Secondary | ICD-10-CM

## 2022-10-19 DIAGNOSIS — Z91199 Patient's noncompliance with other medical treatment and regimen due to unspecified reason: Secondary | ICD-10-CM

## 2022-10-19 DIAGNOSIS — I272 Pulmonary hypertension, unspecified: Secondary | ICD-10-CM | POA: Diagnosis present

## 2022-10-19 DIAGNOSIS — Z885 Allergy status to narcotic agent status: Secondary | ICD-10-CM

## 2022-10-19 DIAGNOSIS — Z8249 Family history of ischemic heart disease and other diseases of the circulatory system: Secondary | ICD-10-CM

## 2022-10-19 DIAGNOSIS — Z951 Presence of aortocoronary bypass graft: Secondary | ICD-10-CM

## 2022-10-19 DIAGNOSIS — Z833 Family history of diabetes mellitus: Secondary | ICD-10-CM

## 2022-10-19 DIAGNOSIS — Z96651 Presence of right artificial knee joint: Secondary | ICD-10-CM | POA: Diagnosis present

## 2022-10-19 DIAGNOSIS — I2510 Atherosclerotic heart disease of native coronary artery without angina pectoris: Secondary | ICD-10-CM | POA: Diagnosis present

## 2022-10-19 DIAGNOSIS — E785 Hyperlipidemia, unspecified: Secondary | ICD-10-CM | POA: Diagnosis present

## 2022-10-19 DIAGNOSIS — I2489 Other forms of acute ischemic heart disease: Secondary | ICD-10-CM | POA: Diagnosis present

## 2022-10-19 DIAGNOSIS — I255 Ischemic cardiomyopathy: Secondary | ICD-10-CM | POA: Diagnosis present

## 2022-10-19 DIAGNOSIS — Z955 Presence of coronary angioplasty implant and graft: Secondary | ICD-10-CM

## 2022-10-19 DIAGNOSIS — Z86718 Personal history of other venous thrombosis and embolism: Secondary | ICD-10-CM

## 2022-10-19 DIAGNOSIS — N1831 Chronic kidney disease, stage 3a: Secondary | ICD-10-CM | POA: Diagnosis present

## 2022-10-19 DIAGNOSIS — I25110 Atherosclerotic heart disease of native coronary artery with unstable angina pectoris: Secondary | ICD-10-CM | POA: Diagnosis present

## 2022-10-19 DIAGNOSIS — Z86711 Personal history of pulmonary embolism: Secondary | ICD-10-CM

## 2022-10-19 LAB — BASIC METABOLIC PANEL
Anion gap: 9 (ref 5–15)
BUN: 18 mg/dL (ref 8–23)
CO2: 24 mmol/L (ref 22–32)
Calcium: 9.1 mg/dL (ref 8.9–10.3)
Chloride: 105 mmol/L (ref 98–111)
Creatinine, Ser: 1.32 mg/dL — ABNORMAL HIGH (ref 0.61–1.24)
GFR, Estimated: 51 mL/min — ABNORMAL LOW (ref >60)
Glucose, Bld: 88 mg/dL (ref 70–99)
Potassium: 4.9 mmol/L (ref 3.5–5.1)
Sodium: 138 mmol/L (ref 135–145)

## 2022-10-19 LAB — CBC
HCT: 36.5 % — ABNORMAL LOW (ref 39.0–52.0)
Hemoglobin: 11.4 g/dL — ABNORMAL LOW (ref 13.0–17.0)
MCH: 31.1 pg (ref 26.0–34.0)
MCHC: 31.2 g/dL (ref 30.0–36.0)
MCV: 99.5 fL (ref 80.0–100.0)
Platelets: 125 10*3/uL — ABNORMAL LOW (ref 150–400)
RBC: 3.67 MIL/uL — ABNORMAL LOW (ref 4.22–5.81)
RDW: 13.9 % (ref 11.5–15.5)
WBC: 6.2 10*3/uL (ref 4.0–10.5)
nRBC: 0 % (ref 0.0–0.2)

## 2022-10-19 LAB — TROPONIN I (HIGH SENSITIVITY)
Troponin I (High Sensitivity): 32 ng/L — ABNORMAL HIGH (ref <18)
Troponin I (High Sensitivity): 35 ng/L — ABNORMAL HIGH (ref <18)

## 2022-10-19 LAB — BRAIN NATRIURETIC PEPTIDE: B Natriuretic Peptide: 638.4 pg/mL — ABNORMAL HIGH (ref 0.0–100.0)

## 2022-10-19 NOTE — ED Triage Notes (Signed)
Pt arrives with c/o SOB that started 2 weeks ago. Pt endorses CP. Pt denies n/v. Pt seen PCP and was advised to be evaluated.

## 2022-10-20 ENCOUNTER — Inpatient Hospital Stay (HOSPITAL_COMMUNITY): Payer: Medicare Other

## 2022-10-20 ENCOUNTER — Encounter (HOSPITAL_COMMUNITY): Payer: Self-pay | Admitting: Family Medicine

## 2022-10-20 DIAGNOSIS — I5043 Acute on chronic combined systolic (congestive) and diastolic (congestive) heart failure: Secondary | ICD-10-CM | POA: Diagnosis present

## 2022-10-20 DIAGNOSIS — Z86711 Personal history of pulmonary embolism: Secondary | ICD-10-CM | POA: Diagnosis not present

## 2022-10-20 DIAGNOSIS — I4891 Unspecified atrial fibrillation: Secondary | ICD-10-CM | POA: Diagnosis not present

## 2022-10-20 DIAGNOSIS — R0602 Shortness of breath: Secondary | ICD-10-CM | POA: Diagnosis present

## 2022-10-20 DIAGNOSIS — Z7982 Long term (current) use of aspirin: Secondary | ICD-10-CM | POA: Diagnosis not present

## 2022-10-20 DIAGNOSIS — Z833 Family history of diabetes mellitus: Secondary | ICD-10-CM | POA: Diagnosis not present

## 2022-10-20 DIAGNOSIS — I252 Old myocardial infarction: Secondary | ICD-10-CM | POA: Diagnosis not present

## 2022-10-20 DIAGNOSIS — I5033 Acute on chronic diastolic (congestive) heart failure: Secondary | ICD-10-CM

## 2022-10-20 DIAGNOSIS — I480 Paroxysmal atrial fibrillation: Secondary | ICD-10-CM | POA: Diagnosis present

## 2022-10-20 DIAGNOSIS — Z8249 Family history of ischemic heart disease and other diseases of the circulatory system: Secondary | ICD-10-CM | POA: Diagnosis not present

## 2022-10-20 DIAGNOSIS — Z86718 Personal history of other venous thrombosis and embolism: Secondary | ICD-10-CM | POA: Diagnosis not present

## 2022-10-20 DIAGNOSIS — I25110 Atherosclerotic heart disease of native coronary artery with unstable angina pectoris: Secondary | ICD-10-CM

## 2022-10-20 DIAGNOSIS — I051 Rheumatic mitral insufficiency: Secondary | ICD-10-CM | POA: Diagnosis present

## 2022-10-20 DIAGNOSIS — I4819 Other persistent atrial fibrillation: Secondary | ICD-10-CM | POA: Diagnosis not present

## 2022-10-20 DIAGNOSIS — I2489 Other forms of acute ischemic heart disease: Secondary | ICD-10-CM | POA: Diagnosis present

## 2022-10-20 DIAGNOSIS — Z91048 Other nonmedicinal substance allergy status: Secondary | ICD-10-CM | POA: Diagnosis not present

## 2022-10-20 DIAGNOSIS — I272 Pulmonary hypertension, unspecified: Secondary | ICD-10-CM | POA: Diagnosis present

## 2022-10-20 DIAGNOSIS — E785 Hyperlipidemia, unspecified: Secondary | ICD-10-CM | POA: Diagnosis present

## 2022-10-20 DIAGNOSIS — N1831 Chronic kidney disease, stage 3a: Secondary | ICD-10-CM | POA: Diagnosis present

## 2022-10-20 DIAGNOSIS — I10 Essential (primary) hypertension: Secondary | ICD-10-CM | POA: Diagnosis not present

## 2022-10-20 DIAGNOSIS — N179 Acute kidney failure, unspecified: Secondary | ICD-10-CM | POA: Diagnosis present

## 2022-10-20 DIAGNOSIS — Z96651 Presence of right artificial knee joint: Secondary | ICD-10-CM | POA: Diagnosis present

## 2022-10-20 DIAGNOSIS — Z885 Allergy status to narcotic agent status: Secondary | ICD-10-CM | POA: Diagnosis not present

## 2022-10-20 DIAGNOSIS — Z951 Presence of aortocoronary bypass graft: Secondary | ICD-10-CM | POA: Diagnosis not present

## 2022-10-20 DIAGNOSIS — I2510 Atherosclerotic heart disease of native coronary artery without angina pectoris: Secondary | ICD-10-CM | POA: Diagnosis present

## 2022-10-20 DIAGNOSIS — Z79899 Other long term (current) drug therapy: Secondary | ICD-10-CM | POA: Diagnosis not present

## 2022-10-20 DIAGNOSIS — I255 Ischemic cardiomyopathy: Secondary | ICD-10-CM | POA: Diagnosis present

## 2022-10-20 DIAGNOSIS — I130 Hypertensive heart and chronic kidney disease with heart failure and stage 1 through stage 4 chronic kidney disease, or unspecified chronic kidney disease: Secondary | ICD-10-CM | POA: Diagnosis present

## 2022-10-20 DIAGNOSIS — I444 Left anterior fascicular block: Secondary | ICD-10-CM | POA: Diagnosis present

## 2022-10-20 DIAGNOSIS — Z955 Presence of coronary angioplasty implant and graft: Secondary | ICD-10-CM | POA: Diagnosis not present

## 2022-10-20 LAB — BASIC METABOLIC PANEL
Anion gap: 13 (ref 5–15)
BUN: 18 mg/dL (ref 8–23)
CO2: 25 mmol/L (ref 22–32)
Calcium: 9.6 mg/dL (ref 8.9–10.3)
Chloride: 102 mmol/L (ref 98–111)
Creatinine, Ser: 1.31 mg/dL — ABNORMAL HIGH (ref 0.61–1.24)
GFR, Estimated: 52 mL/min — ABNORMAL LOW (ref >60)
Glucose, Bld: 85 mg/dL (ref 70–99)
Potassium: 3.6 mmol/L (ref 3.5–5.1)
Sodium: 140 mmol/L (ref 135–145)

## 2022-10-20 LAB — ECHOCARDIOGRAM COMPLETE
AR max vel: 1.58 cm2
AV Area VTI: 1.34 cm2
AV Area mean vel: 1.51 cm2
AV Mean grad: 4 mmHg
AV Peak grad: 7.2 mmHg
Ao pk vel: 1.35 m/s
Area-P 1/2: 6.67 cm2
Est EF: 40
Height: 74 in
MV M vel: 5.28 m/s
MV Peak grad: 111.5 mmHg
P 1/2 time: 577 msec
S' Lateral: 5.7 cm
Weight: 2880 oz

## 2022-10-20 LAB — MAGNESIUM: Magnesium: 2 mg/dL (ref 1.7–2.4)

## 2022-10-20 LAB — MRSA NEXT GEN BY PCR, NASAL: MRSA by PCR Next Gen: DETECTED — AB

## 2022-10-20 LAB — HEPARIN LEVEL (UNFRACTIONATED): Heparin Unfractionated: 0.42 IU/mL (ref 0.30–0.70)

## 2022-10-20 LAB — TSH: TSH: 2.789 u[IU]/mL (ref 0.350–4.500)

## 2022-10-20 MED ORDER — PERFLUTREN LIPID MICROSPHERE
1.0000 mL | INTRAVENOUS | Status: AC | PRN
  Administered 2022-10-20: 2 mL via INTRAVENOUS

## 2022-10-20 MED ORDER — SODIUM CHLORIDE 0.9% FLUSH
3.0000 mL | Freq: Two times a day (BID) | INTRAVENOUS | Status: DC
  Administered 2022-10-20 – 2022-10-22 (×5): 3 mL via INTRAVENOUS

## 2022-10-20 MED ORDER — SIMVASTATIN 20 MG PO TABS
40.0000 mg | ORAL_TABLET | Freq: Every day | ORAL | Status: DC
  Administered 2022-10-20 – 2022-10-21 (×2): 40 mg via ORAL
  Filled 2022-10-20 (×2): qty 2

## 2022-10-20 MED ORDER — HEPARIN (PORCINE) 25000 UT/250ML-% IV SOLN
1100.0000 [IU]/h | INTRAVENOUS | Status: DC
  Administered 2022-10-20: 1100 [IU]/h via INTRAVENOUS
  Filled 2022-10-20: qty 250

## 2022-10-20 MED ORDER — FUROSEMIDE 10 MG/ML IJ SOLN
80.0000 mg | Freq: Once | INTRAMUSCULAR | Status: AC
  Administered 2022-10-20: 80 mg via INTRAVENOUS
  Filled 2022-10-20: qty 8

## 2022-10-20 MED ORDER — HEPARIN BOLUS VIA INFUSION
2000.0000 [IU] | Freq: Once | INTRAVENOUS | Status: AC
  Administered 2022-10-20: 2000 [IU] via INTRAVENOUS
  Filled 2022-10-20: qty 2000

## 2022-10-20 MED ORDER — AMIODARONE HCL IN DEXTROSE 360-4.14 MG/200ML-% IV SOLN
60.0000 mg/h | INTRAVENOUS | Status: DC
  Administered 2022-10-20: 60 mg/h via INTRAVENOUS
  Filled 2022-10-20: qty 200

## 2022-10-20 MED ORDER — ACETAMINOPHEN 650 MG RE SUPP: 650.0000 mg | Freq: Four times a day (QID) | RECTAL | Status: DC | PRN

## 2022-10-20 MED ORDER — APIXABAN 5 MG PO TABS
5.0000 mg | ORAL_TABLET | Freq: Two times a day (BID) | ORAL | Status: DC
  Administered 2022-10-20 – 2022-10-22 (×4): 5 mg via ORAL
  Filled 2022-10-20 (×4): qty 1

## 2022-10-20 MED ORDER — APIXABAN 5 MG PO TABS: 5.0000 mg | ORAL_TABLET | Freq: Two times a day (BID) | ORAL | Status: DC

## 2022-10-20 MED ORDER — HALOPERIDOL LACTATE 5 MG/ML IJ SOLN
5.0000 mg | Freq: Once | INTRAMUSCULAR | Status: AC
  Administered 2022-10-20: 5 mg via INTRAVENOUS
  Filled 2022-10-20: qty 1

## 2022-10-20 MED ORDER — DILTIAZEM LOAD VIA INFUSION
10.0000 mg | Freq: Once | INTRAVENOUS | Status: AC
  Administered 2022-10-20: 10 mg via INTRAVENOUS
  Filled 2022-10-20: qty 10

## 2022-10-20 MED ORDER — METOPROLOL TARTRATE 25 MG PO TABS
25.0000 mg | ORAL_TABLET | Freq: Two times a day (BID) | ORAL | Status: DC
  Administered 2022-10-20: 25 mg via ORAL
  Filled 2022-10-20: qty 1

## 2022-10-20 MED ORDER — AMIODARONE HCL IN DEXTROSE 360-4.14 MG/200ML-% IV SOLN: 30.0000 mg/h | INTRAVENOUS | Status: DC

## 2022-10-20 MED ORDER — MUPIROCIN 2 % EX OINT
1.0000 | TOPICAL_OINTMENT | Freq: Two times a day (BID) | CUTANEOUS | Status: DC
  Administered 2022-10-20 – 2022-10-22 (×4): 1 via NASAL
  Filled 2022-10-20: qty 22

## 2022-10-20 MED ORDER — AMIODARONE LOAD VIA INFUSION
150.0000 mg | Freq: Once | INTRAVENOUS | Status: AC
  Administered 2022-10-20: 150 mg via INTRAVENOUS
  Filled 2022-10-20: qty 83.34

## 2022-10-20 MED ORDER — ACETAMINOPHEN 325 MG PO TABS: 650.0000 mg | ORAL_TABLET | Freq: Four times a day (QID) | ORAL | Status: DC | PRN

## 2022-10-20 MED ORDER — CARVEDILOL 3.125 MG PO TABS
3.1250 mg | ORAL_TABLET | Freq: Two times a day (BID) | ORAL | Status: DC
  Administered 2022-10-20 (×2): 3.125 mg via ORAL
  Filled 2022-10-20 (×2): qty 1

## 2022-10-20 MED ORDER — DILTIAZEM HCL-DEXTROSE 125-5 MG/125ML-% IV SOLN (PREMIX)
5.0000 mg/h | INTRAVENOUS | Status: DC
  Administered 2022-10-20: 5 mg/h via INTRAVENOUS
  Filled 2022-10-20: qty 125

## 2022-10-20 MED ORDER — SODIUM CHLORIDE 0.9 % IV SOLN: INTRAVENOUS | Status: AC

## 2022-10-20 MED ORDER — CHLORHEXIDINE GLUCONATE CLOTH 2 % EX PADS: 6.0000 | MEDICATED_PAD | Freq: Every day | CUTANEOUS | Status: DC

## 2022-10-20 NOTE — Progress Notes (Signed)
Patient seen and examined personally, I reviewed the chart, history and physical and admission note, done by admitting physician this morning and agree with the same with following addendum.  Please refer to the morning admission note for more detailed plan of care.  Briefly, 89 y.o.m w/ HTN HLD CAD s/p CABG, AAA S/P stent graft, ischemic cardiomyopathy, and PAF no longer anticoagulated presented to the ED with shortness of breath, generalized weakness x 2 wk. He reports chronic swelling of the lower left leg but has more recently noticed bilateral lower extremity edema. He saw his PCP-found to be in atrial fibrillation, appeared to be hypervolemic, so sent to the ED. WN:UUVO tachycardia BP stable, not hypoxic. Labs with fairly mild creatinine at 1.3 platelet 125 elevated BNP 638 troponin 32> 35 otherwise unremarkable. Chest x-ray no acute finding. Cardiology was consulted by the ED physician and the patient was given 80 mg IV Lasix and started on IV heparin.   On exam-He feels well remains in A-fib Denies chest pain shortness of breath.  A/P A-fib with ZDG:[id number] episode of A-fib was noted, was on Xarelto subsequently not taking for unclear reason.  Cardiology on board on heparin drip, placed on low-dose Coreg heart rate stable.  Follow-up echocardiogram, TSH-further plan as per cardiology  Mild elevated troponin likely demand ischemia in the setting of A-fib RVR and CHF. Acute on chronic HFpEF: Got Lasix in the ED, follow-up echocardiogram further diuretics per cardiology monitor intake output CAD with previous CABG: Continue statin beta-blocker CKD 3A baseline creatinine 1.1 and epileptologist 24, monitor

## 2022-10-20 NOTE — Discharge Instructions (Signed)
Information on my medicine - ELIQUIS (apixaban)  This medication education was reviewed with me or my healthcare representative as part of my discharge preparation.  The pharmacist that spoke with me during my hospital stay was:  Morgin Halls, RPH  Why was Eliquis prescribed for you? Eliquis was prescribed for you to reduce the risk of forming blood clots that can cause a stroke if you have a medical condition called atrial fibrillation (a type of irregular heartbeat) OR to reduce the risk of a blood clots forming after orthopedic surgery.  What do You need to know about Eliquis ? Take your Eliquis TWICE DAILY - one tablet in the morning and one tablet in the evening with or without food.  It would be best to take the doses about the same time each day.  If you have difficulty swallowing the tablet whole please discuss with your pharmacist how to take the medication safely.  Take Eliquis exactly as prescribed by your doctor and DO NOT stop taking Eliquis without talking to the doctor who prescribed the medication.  Stopping may increase your risk of developing a new clot or stroke.  Refill your prescription before you run out.  After discharge, you should have regular check-up appointments with your healthcare provider that is prescribing your Eliquis.  In the future your dose may need to be changed if your kidney function or weight changes by a significant amount or as you get older.  What do you do if you miss a dose? If you miss a dose, take it as soon as you remember on the same day and resume taking twice daily.  Do not take more than one dose of ELIQUIS at the same time.  Important Safety Information A possible side effect of Eliquis is bleeding. You should call your healthcare provider right away if you experience any of the following: Bleeding from an injury or your nose that does not stop. Unusual colored urine (red or dark brown) or unusual colored stools (red or  black). Unusual bruising for unknown reasons. A serious fall or if you hit your head (even if there is no bleeding).  Some medicines may interact with Eliquis and might increase your risk of bleeding or clotting while on Eliquis. To help avoid this, consult your healthcare provider or pharmacist prior to using any new prescription or non-prescription medications, including herbals, vitamins, non-steroidal anti-inflammatory drugs (NSAIDs) and supplements.  This website has more information on Eliquis (apixaban): http://www.eliquis.com/eliquis/home   

## 2022-10-20 NOTE — ED Provider Notes (Signed)
[place] EMERGENCY DEPARTMENT AT [facility] Provider Note   CSN: [id number] Arrival date & time: 10/19/22  1815     History  Chief Complaint  Patient presents with   Shortness of Breath    Jonathan Arroyo is a 89 y.o. male.  Patient presents to the emergency department for evaluation of shortness of breath.  He reports that he has been experiencing shortness of breath and generalized weakness for a couple of weeks.  He reports that initially it was just when he would bend over to tie his shoes or do certain activities but now is short of breath all the time.  He reports that he is prescribed a water pill but family reports that he generally forgets to take it.  He is not experiencing any chest pain.       Home Medications Prior to Admission medications   Medication Sig Start Date End Date Taking? Authorizing Provider  aspirin EC 325 MG tablet Take 1 tablet (325 mg total) by mouth daily. 06/15/17   Micki Riley, MD  busPIRone (BUSPAR) 10 MG tablet Take 10 mg by mouth 2 (two) times daily as needed (for anxiety).     [provider]  carvedilol (COREG) 6.25 MG tablet Take 1 tablet by mouth twice daily for heart. *needs appointment for refills* 04/24/22   Kathleene Hazel, MD  finasteride (PROSCAR) 5 MG tablet Take 5 mg by mouth daily.      [provider]  furosemide (LASIX) 20 MG tablet Take 20 mg by mouth daily as needed for fluid.    [provider]  gabapentin (NEURONTIN) 300 MG capsule Take 1 capsule by mouth daily. 04/29/18   [provider]  lisinopril (PRINIVIL,ZESTRIL) 40 MG tablet Take 20 mg by mouth 2 (two) times daily.  12/24/10   Herby Abraham, MD  meclizine (ANTIVERT) 12.5 MG tablet Take 12.5 mg by mouth 3 (three) times daily as needed for dizziness.    [provider]  nitroGLYCERIN (NITROSTAT) 0.4 MG SL tablet Place 1 tablet (0.4 mg total) under the tongue every 5 (five) minutes as needed for chest pain  (MAX 3 TABLETS). 10/09/19   Kathleene Hazel, MD  polyethylene glycol (MIRALAX / Ethelene Hal) packet Take 17 g by mouth daily as needed for mild constipation.    [provider]  simvastatin (ZOCOR) 40 MG tablet Take 40 mg by mouth at bedtime.     [provider]  traMADol (ULTRAM) 50 MG tablet Take 50 mg by mouth 2 (two) times daily as needed for moderate pain.  09/22/16   [provider]  zolpidem (AMBIEN) 10 MG tablet Take 10 mg by mouth at bedtime.     [provider]      Allergies    Alum & mag hydroxide-simeth and Oxycodone-acetaminophen    Review of Systems   Review of Systems  Physical Exam Updated Vital Signs BP 135/83 (BP Location: Right Arm)   Pulse (!) 109   Temp 97.9 F (36.6 C) (Oral)   Resp 17   Ht 6\' 2"  (1.88 m)   Wt 81.6 kg   SpO2 99%   BMI 23.11 kg/m  Physical Exam Vitals and nursing note reviewed.  Constitutional:      General: He is not in acute distress.    Appearance: He is well-developed.  HENT:     Head: Normocephalic and atraumatic.     Mouth/Throat:     Mouth: Mucous membranes are moist.  Eyes:     General: Vision grossly intact. Gaze aligned appropriately.     Extraocular Movements: Extraocular movements intact.     Conjunctiva/sclera: Conjunctivae normal.  Cardiovascular:     Rate and Rhythm: Tachycardia present. Rhythm irregular.     Pulses: Normal pulses.     Heart sounds: Normal heart sounds, S1 normal and S2 normal. No murmur heard.    No friction rub. No gallop.  Pulmonary:     Effort: Pulmonary effort is normal. Tachypnea present. No respiratory distress.     Breath sounds: Decreased breath sounds present.  Abdominal:     Palpations: Abdomen is soft.     Tenderness: There is no abdominal tenderness. There is no guarding or rebound.     Hernia: No hernia is present.  Musculoskeletal:        General: No swelling.     Cervical back: Full passive range of motion without pain, normal range of motion  and neck supple. No pain with movement, spinous process tenderness or muscular tenderness. Normal range of motion.     Right lower leg: No edema.     Left lower leg: No edema.  Skin:    General: Skin is warm and dry.     Capillary Refill: Capillary refill takes less than 2 seconds.     Findings: No ecchymosis, erythema, lesion or wound.  Neurological:     Mental Status: He is alert and oriented to person, place, and time.     GCS: GCS eye subscore is 4. GCS verbal subscore is 5. GCS motor subscore is 6.     Cranial Nerves: Cranial nerves 2-12 are intact.     Sensory: Sensation is intact.     Motor: Motor function is intact. No weakness or abnormal muscle tone.     Coordination: Coordination is intact.  Psychiatric:        Mood and Affect: Mood normal.        Speech: Speech normal.        Behavior: Behavior normal.     ED Results / Procedures / Treatments   Labs (all labs ordered are listed, but only abnormal results are displayed) Labs Reviewed  BASIC METABOLIC PANEL - Abnormal; Notable for the following components:      Result Value   Creatinine, Ser 1.32 (*)    GFR, Estimated 51 (*)    All other components within normal limits  CBC - Abnormal; Notable for the following components:   RBC 3.67 (*)    Hemoglobin 11.4 (*)    HCT 36.5 (*)    Platelets 125 (*)    All other components within normal limits  BRAIN NATRIURETIC PEPTIDE - Abnormal; Notable for the following components:   B Natriuretic Peptide 638.4 (*)    All other components within normal limits  TROPONIN I (HIGH SENSITIVITY) - Abnormal; Notable for the following components:   Troponin I (High Sensitivity) 32 (*)    All other components within normal limits  TROPONIN I (HIGH SENSITIVITY) - Abnormal; Notable for the following components:   Troponin I (High Sensitivity) 35 (*)    All other components within normal limits    EKG EKG Interpretation  Date/Time:  Tuesday October 20 2022 01:18:58 EDT Ventricular Rate:   125 PR Interval:    QRS Duration: 113 QT Interval:  333 QTC Calculation: 481 R Axis:   -42 Text Interpretation: Atrial fibrillation Left anterior fascicular block Probable anteroseptal infarct, old ST depression, probably rate related Confirmed by Rhona Fusilier,  Canary Brim ([id number]) on 10/20/2022 1:50:29 AM  Radiology DG Chest 2 View  Result Date: 10/19/2022 CLINICAL DATA:  Chest pain and shortness of breath. EXAM: CHEST - 2 VIEW COMPARISON:  February 15, 2017 FINDINGS: Multiple sternal wires and vascular clips are seen. There is marked severity calcification of the aortic arch and tortuosity of the descending thoracic aorta. The heart size and mediastinal contours are within normal limits. There is stable mild to moderate severity elevation of the right hemidiaphragm. There is no evidence of an acute infiltrate, pleural effusion or pneumothorax. Multilevel degenerative changes are seen throughout the thoracic spine. A vascular stent is noted within the visualized portion of the abdomen on the lateral view. IMPRESSION: 1. Evidence of prior median sternotomy/CABG. 2. No acute cardiopulmonary disease. Electronically Signed   By: Aram Candela M.D.   On: 10/19/2022 19:32    Procedures Procedures    Medications Ordered in ED Medications  amiodarone (NEXTERONE) 1.8 mg/mL load via infusion 150 mg (has no administration in time range)    Followed by  amiodarone (NEXTERONE PREMIX) 360-4.14 MG/200ML-% (1.8 mg/mL) IV infusion (has no administration in time range)    Followed by  amiodarone (NEXTERONE PREMIX) 360-4.14 MG/200ML-% (1.8 mg/mL) IV infusion (has no administration in time range)  furosemide (LASIX) injection 80 mg (has no administration in time range)    ED Course/ Medical Decision Making/ A&P         CHA2DS2-VASc Score: 7                    Medical Decision Making Amount and/or Complexity of Data Reviewed External Data Reviewed: labs, ECG and notes. Labs: ordered. Decision-making  details documented in ED Course. Radiology: ordered and independent interpretation performed. Decision-making details documented in ED Course. ECG/medicine tests: ordered and independent interpretation performed. Decision-making details documented in ED Course.  Risk Prescription drug management.   Presents to the emergency department for evaluation of shortness of breath.  Patient reports that this has been ongoing and progressively worsening for a period of approximately 2 weeks.  Patient is a little bit of a poor historian.  I have access to outside records.  It seems that he may have distant history of paroxysmal atrial fibrillation but no recent problems with A-fib.  He saw his doctor earlier today for these symptoms and there were technical problems so they could not get an EKG or additional testing, but the primary care doctor was suspicious that the patient was volume overloaded and in A-fib, recommended to come to the ER.  At time of my evaluation, patient does appear volume overloaded.  He is also in atrial fibrillation with a rapid ventricular response.  Rate control will be initially achieved with IV amiodarone, as calcium channel blockers and beta-blockers would be initially contraindicated in this patient who is actively in heart failure.  CHA2DS2-VASc score is 7, initiate IV heparinization.  Will also initiate diuresis with IV Lasix.  Addendum: Discussed with cardiology, Dr. Juel Burrow.  Recommends holding off on amiodarone to prevent conversion to normal sinus rhythm, as patient is currently not anticoagulated.  Agrees with heparinization, diuresis, tolerate tachycardia at this time.  Likely TEE and cardioversion in the morning.  Keep patient NPO.  Dr. Juel Burrow will consult.  Admit to medicine.  CRITICAL CARE Performed by: Gilda Crease   Total critical care time: 35 minutes  Critical care time was exclusive of separately billable procedures and treating other patients.  Critical  care was necessary to treat  or prevent imminent or life-threatening deterioration.  Critical care was time spent personally by me on the following activities: development of treatment plan with patient and/or surrogate as well as nursing, discussions with consultants, evaluation of patient's response to treatment, examination of patient, obtaining history from patient or surrogate, ordering and performing treatments and interventions, ordering and review of laboratory studies, ordering and review of radiographic studies, pulse oximetry and re-evaluation of patient's condition.         Final Clinical Impression(s) / ED Diagnoses Final diagnoses:  Atrial fibrillation with RVR (HCC)  Acute on chronic congestive heart failure, unspecified heart failure type [facility])    Rx / DC Orders ED Discharge Orders     None         Yanely Mast, Canary Brim, MD 10/20/22 [phone]

## 2022-10-20 NOTE — Progress Notes (Signed)
Rounding Note    Patient Name: Jonathan Arroyo Date of Encounter: 10/20/2022  [facility] Cardiologist: Verne Carrow, MD    Subjective   89 year old gentleman presents with rapid atrial fibrillation and symptoms of heart failure.  He also has a history of hyperlipidemia, hypertension, coronary artery disease status post coronary artery bypass grafting with redo bypass grafting in 2005, abdominal aortic aneurysm with aortic stent graft, mitral regurgitation, hiatal hernia, history of DVT.  The patient reports 1 to 2 months of feeling poorly.  He had progressive shortness of breath.  He finally went to his medical doctor and was found to have rapid atrial fibrillation.  He was sent to the emergency room for further evaluation.  He has not been on anticoagulation but he does not recall why. He denies having any bleeding.   Echo shows  LVEF of 40% Mildly reduced RV function  Moderate pulmonary HTN with est. PA pressure of 47 Severe LAE Moderate LAE Mild - moderate MR Trivial AI  Mild dilatatio of the ascending aorta measuring 41 mm   Inpatient Medications    Scheduled Meds:  apixaban  5 mg Oral BID   carvedilol  3.125 mg Oral BID WC   diltiazem  10 mg Intravenous Once   simvastatin  40 mg Oral QHS   sodium chloride flush  3 mL Intravenous Q12H   Continuous Infusions:  sodium chloride     diltiazem (CARDIZEM) infusion     PRN Meds: acetaminophen **OR** acetaminophen   Vital Signs    Vitals:   10/20/22 1400 10/20/22 1411 10/20/22 1500 10/20/22 1600  BP: (!) 141/108  (!) 162/101 (!) 155/104  Pulse: (!) 113  (!) 118 (!) 126  Resp: (!) 25  (!) 22 19  Temp:  97.9 F (36.6 C)    TempSrc:  Oral    SpO2: 97%  98% 97%  Weight:      Height:        Intake/Output Summary (Last 24 hours) at 10/20/2022 1753 Last data filed at 10/20/2022 1108 Gross per 24 hour  Intake --  Output 3450 ml  Net -3450 ml      10/19/2022    6:37 PM 10/09/2019   10:00 AM  07/18/2018    1:37 PM  Last 3 Weights  Weight (lbs) 180 lb 180 lb 186 lb 1.9 oz  Weight (kg) 81.647 kg 81.647 kg 84.423 kg      Telemetry    Atrial fib with RVR  - Personally Reviewed  ECG    Atrial fib with RVR  - Personally Reviewed  Physical Exam   GEN: elderly , somewhat frail man. No acute distress.   Neck: No JVD Cardiac: irreg. Irreg.  Soft systolic murmur  Respiratory: Clear to auscultation bilaterally. GI: Soft, nontender, non-distended  MS: No edema; No deformity. Neuro:  Nonfocal  Psych: Normal affect   Labs    High Sensitivity Troponin:   Recent Labs  Lab 10/19/22 1843 10/19/22 2155  TROPONINIHS 32* 35*     Chemistry Recent Labs  Lab 10/19/22 1843 10/20/22 0730  NA 138 140  K 4.9 3.6  CL 105 102  CO2 24 25  GLUCOSE 88 85  BUN 18 18  CREATININE 1.32* 1.31*  CALCIUM 9.1 9.6  MG  --  2.0  GFRNONAA 51* 52*  ANIONGAP 9 13    Lipids No results for input(s): "CHOL", "TRIG", "HDL", "LABVLDL", "LDLCALC", "CHOLHDL" in the last 168 hours.  Hematology Recent Labs  Lab  10/19/22 1843  WBC 6.2  RBC 3.67*  HGB 11.4*  HCT 36.5*  MCV 99.5  MCH 31.1  MCHC 31.2  RDW 13.9  PLT 125*   Thyroid  Recent Labs  Lab 10/20/22 0730  TSH 2.789    BNP Recent Labs  Lab 10/19/22 1843  BNP 638.4*    DDimer No results for input(s): "DDIMER" in the last 168 hours.   Radiology    ECHOCARDIOGRAM COMPLETE  Result Date: 10/20/2022    ECHOCARDIOGRAM REPORT   Patient Name:   Jonathan Arroyo Date of Exam: 10/20/2022 Medical Rec #:  [id number]        Height:       74.0 in Accession #:    [id number]       Weight:       180.0 lb Date of Birth:  [birth year]         BSA:          2.078 m Patient Age:    89 years         BP:           136/87 mmHg Patient Gender: M                HR:           80 bpm. Exam Location:  Inpatient Procedure: 2D Echo, Cardiac Doppler, Color Doppler and Intracardiac            Opacification Agent Indications:    Atrial Fibrillation I48.91  History:         Patient has prior history of Echocardiogram examinations, most                 recent 03/18/2017. CHF, CAD and Angina, TIA, Arrythmias:Atrial                 Fibrillation; Risk Factors:Hypertension and Dyslipidemia. CKD,                 stage 3.  Sonographer:    Lucendia Herrlich Referring Phys: [id number] TIMOTHY S OPYD IMPRESSIONS  1. Left ventricular ejection fraction, by estimation, is 40%. The left ventricle has mild to moderately decreased function. The left ventricle demonstrates global hypokinesis with akinesis of the basal inferior and basal inferolateral walls. There is moderate concentric left ventricular hypertrophy. Left ventricular diastolic parameters are indeterminate.  2. Right ventricular systolic function is mildly reduced. The right ventricular size is mildly enlarged. There is moderately elevated pulmonary artery systolic pressure. The estimated right ventricular systolic pressure is 46.9 mmHg.  3. Left atrial size was severely dilated.  4. Right atrial size was moderately dilated.  5. The mitral valve is normal in structure. Mild to moderate mitral valve regurgitation. No evidence of mitral stenosis.  6. The aortic valve is tricuspid. There is moderate calcification of the aortic valve. Aortic valve regurgitation is trivial. Aortic valve sclerosis/calcification is present, without any evidence of aortic stenosis.  7. Aortic dilatation noted. There is borderline dilatation of the aortic root, measuring 37 mm. There is mild dilatation of the ascending aorta, measuring 41 mm.  8. The inferior vena cava is dilated in size with >50% respiratory variability, suggesting right atrial pressure of 8 mmHg.  9. The patient was in atrial fibrillation. FINDINGS  Left Ventricle: Left ventricular ejection fraction, by estimation, is 40%. The left ventricle has mild to moderately decreased function. The left ventricle demonstrates global hypokinesis. Definity contrast agent was given IV to delineate the left  ventricular endocardial borders. The  left ventricular internal cavity size was normal in size. There is moderate concentric left ventricular hypertrophy. Left ventricular diastolic parameters are indeterminate. Right Ventricle: The right ventricular size is mildly enlarged. No increase in right ventricular wall thickness. Right ventricular systolic function is mildly reduced. There is moderately elevated pulmonary artery systolic pressure. The tricuspid regurgitant velocity is 3.12 m/s, and with an assumed right atrial pressure of 8 mmHg, the estimated right ventricular systolic pressure is 46.9 mmHg. Left Atrium: Left atrial size was severely dilated. Right Atrium: Right atrial size was moderately dilated. Pericardium: There is no evidence of pericardial effusion. Mitral Valve: The mitral valve is normal in structure. There is mild calcification of the mitral valve leaflet(s). Mild to moderate mitral valve regurgitation. No evidence of mitral valve stenosis. Tricuspid Valve: The tricuspid valve is normal in structure. Tricuspid valve regurgitation is mild. Aortic Valve: The aortic valve is tricuspid. There is moderate calcification of the aortic valve. Aortic valve regurgitation is trivial. Aortic regurgitation PHT measures 577 msec. Aortic valve sclerosis/calcification is present, without any evidence of aortic stenosis. Aortic valve mean gradient measures 4.0 mmHg. Aortic valve peak gradient measures 7.2 mmHg. Aortic valve area, by VTI measures 1.34 cm. Pulmonic Valve: The pulmonic valve was normal in structure. Pulmonic valve regurgitation is trivial. Aorta: Aortic dilatation noted. There is borderline dilatation of the aortic root, measuring 37 mm. There is mild dilatation of the ascending aorta, measuring 41 mm. Venous: The inferior vena cava is dilated in size with greater than 50% respiratory variability, suggesting right atrial pressure of 8 mmHg. IAS/Shunts: No atrial level shunt detected by color flow  Doppler.  LEFT VENTRICLE PLAX 2D LVIDd:         5.80 cm   Diastology LVIDs:         5.70 cm   LV e' medial:    5.98 cm/s LV PW:         1.10 cm   LV E/e' medial:  11.9 LV IVS:        1.20 cm   LV e' lateral:   11.10 cm/s LVOT diam:     1.90 cm   LV E/e' lateral: 6.4 LV SV:         32 LV SV Index:   15 LVOT Area:     2.84 cm  RIGHT VENTRICLE             IVC RV S prime:     11.80 cm/s  IVC diam: 2.60 cm TAPSE (M-mode): 2.0 cm LEFT ATRIUM              Index        RIGHT ATRIUM           Index LA diam:        5.40 cm  2.60 cm/m   RA Area:     23.40 cm LA Vol (A2C):   110.0 ml 52.93 ml/m  RA Volume:   69.80 ml  33.59 ml/m LA Vol (A4C):   103.0 ml 49.56 ml/m LA Biplane Vol: 110.0 ml 52.93 ml/m  AORTIC VALVE                    PULMONIC VALVE AV Area (Vmax):    1.58 cm     PR End Diast Vel: 2.06 msec AV Area (Vmean):   1.51 cm AV Area (VTI):     1.34 cm AV Vmax:           134.50 cm/s AV Vmean:  91.200 cm/s AV VTI:            0.237 m AV Peak Grad:      7.2 mmHg AV Mean Grad:      4.0 mmHg LVOT Vmax:         74.90 cm/s LVOT Vmean:        48.567 cm/s LVOT VTI:          0.112 m LVOT/AV VTI ratio: 0.47 AI PHT:            577 msec  AORTA Ao Root diam: 3.70 cm Ao Asc diam:  4.10 cm MITRAL VALVE               TRICUSPID VALVE MV Area (PHT): 6.67 cm    TR Peak grad:   38.9 mmHg MR Peak grad: 111.5 mmHg   TR Vmax:        312.00 cm/s MR Vmax:      528.00 cm/s MV E velocity: 71.10 cm/s  SHUNTS MV A velocity: 52.30 cm/s  Systemic VTI:  0.11 m MV E/A ratio:  1.36        Systemic Diam: 1.90 cm Dalton McleanMD Electronically signed by Wilfred Lacy Signature Date/Time: 10/20/2022/4:19:47 PM    Final    DG Chest 2 View  Result Date: 10/19/2022 CLINICAL DATA:  Chest pain and shortness of breath. EXAM: CHEST - 2 VIEW COMPARISON:  February 15, 2017 FINDINGS: Multiple sternal wires and vascular clips are seen. There is marked severity calcification of the aortic arch and tortuosity of the descending thoracic aorta. The heart  size and mediastinal contours are within normal limits. There is stable mild to moderate severity elevation of the right hemidiaphragm. There is no evidence of an acute infiltrate, pleural effusion or pneumothorax. Multilevel degenerative changes are seen throughout the thoracic spine. A vascular stent is noted within the visualized portion of the abdomen on the lateral view. IMPRESSION: 1. Evidence of prior median sternotomy/CABG. 2. No acute cardiopulmonary disease. Electronically Signed   By: Aram Candela M.D.   On: 10/19/2022 19:32    Cardiac Studies     Patient Profile     89 y.o. male with CAD- s/p CABG, AAA s/p stent grafting repair, now with rapid atrial fib   Assessment & Plan     Afib with RVR :   he has been NPO all day ( and yesterday ) I suspect he is somewhat volume depleted.   Will allow him to eat.  Will give some IV NS  Start IV diltiazem  Transition iv heparin to eliquis   Echo shows EF 40% Will use IV dilt to slow his HR .  May not be the best long term medication  Change coreg to metoprolol for now ( better HR control without lowering his BP as much )   2.  Chronic combined CHF:  may be due to his CAD .   Also may be due to Afib with RVR for a prolonged time .         For questions or updates, please contact  [facility] Please consult [email or web address] for contact info under        Signed, Kristeen Miss, MD  10/20/2022, 5:53 PM

## 2022-10-20 NOTE — Progress Notes (Signed)
ANTICOAGULATION CONSULT NOTE- Follow Up  Pharmacy Consult for apixaban Indication: atrial fibrillation  Allergies  Allergen Reactions   Alum & Mag Hydroxide-Simeth Other (See Comments)    Unknown (Mylanta)   Oxycodone-Acetaminophen Other (See Comments)    unknown    Patient Measurements: Height: 6\' 2"  (188 cm) Weight: 81.6 kg (180 lb) IBW/kg (Calculated) : 82.2 Heparin Dosing Weight: 81.6 kg  Vital Signs: Temp: 97.9 F (36.6 C) (06/04 1411) Temp Source: Oral (06/04 1411) BP: 155/104 (06/04 1600) Pulse Rate: 126 (06/04 1600)  Labs: Recent Labs    10/19/22 1843 10/19/22 2155 10/20/22 0730 10/20/22 1105  HGB 11.4*  --   --   --   HCT 36.5*  --   --   --   PLT 125*  --   --   --   HEPARINUNFRC  --   --   --  0.42  CREATININE 1.32*  --  1.31*  --   TROPONINIHS 32* 35*  --   --      Estimated Creatinine Clearance: 43.3 mL/min (A) (by C-G formula based on SCr of 1.31 mg/dL (H)).   Medical History: Past Medical History:  Diagnosis Date   Abdominal aneurysm without mention of rupture    a. s/p stent graft 2015 by [facility].   Acute venous embolism and thrombosis of unspecified deep vessels of lower extremity    Anxiety    CAD (coronary artery disease)    a. s/p CABG 1994, redo 2005. b. [place] 03/2013: 2/3 patent bpg. s/p PTCA/DES to distal LM and PTCA/DES to distal LCx.   Chronic combined systolic and diastolic CHF (congestive heart failure) (HCC)    Esophageal reflux    Hiatal hernia    HISTORY   HTN (hypertension)    Hyperlipidemia    Ischemic cardiomyopathy    a. EF history: 2011: 35-40%, 11/2012: 45-50%. b. 03/2013: 35% by cath.   Mild mitral regurgitation    Myocardial infarction [facility])    Other specified gastritis without mention of hemorrhage    Premature atrial contractions    Pulmonary embolism (HCC) 04/2016   PVC's (premature ventricular contractions)    Renal calculus    history   Spinal stenosis, unspecified region other than cervical     Stomach pain June 2013   Hospital stay at [facility]   Stroke [facility])     Medications:  (Not in a hospital admission) Scheduled:   carvedilol  3.125 mg Oral BID WC   diltiazem  10 mg Intravenous Once   simvastatin  40 mg Oral QHS   sodium chloride flush  3 mL Intravenous Q12H   Assessment: 89 yoM presents with SOB. He has been experiencing SOB and generalized weakness for a couple weeks. Has probable history of paroxysmal atrial fibrillation but no recent problems documented. He saw his doctor earlier on 6/3 for these symptoms and there were technical problems so they could not get an EKG or additional testing, but the primary care doctor was suspicious that the patient was volume overloaded and in A-fib, recommended to come to the ER. EKG showed atrial fibrillation.   Pharmacy was consulted to dose heparin for atrial fibrillation and now patient is transitioning to apixaban.   Hgb 11.4, plts 125, sCr 1.31, with no PTA anticoagulant identified and confirmed with patient per RN. No signs/symptoms of bleeding reported.   Patient is 89, Wt 81.6, and sCr 1.31-->qualifies for the 5 mg dose   Goal of Therapy:  Monitor platelets by anticoagulation protocol:  Yes   Plan:  Start apixaban 5 mg PO BID CBC, s/s bleeding Follow up patient education  Thanks,   Arabella Merles, PharmD. [facility] Acute Care PGY-1 10/20/2022 5:49 PM

## 2022-10-20 NOTE — ED Notes (Signed)
Patient resting quietly with eyes closed.  Respirations even and unlabored.  °

## 2022-10-20 NOTE — Progress Notes (Signed)
ANTICOAGULATION CONSULT NOTE  Pharmacy Consult for heparin Indication: atrial fibrillation  Allergies  Allergen Reactions   Alum & Mag Hydroxide-Simeth Other (See Comments)    Unknown (Mylanta)   Oxycodone-Acetaminophen Other (See Comments)    unknown    Patient Measurements: Height: 6\' 2"  (188 cm) Weight: 81.6 kg (180 lb) IBW/kg (Calculated) : 82.2 Heparin Dosing Weight: 81.6 kg  Vital Signs: Temp: 97.8 F (36.6 C) (06/04 1015) Temp Source: Oral (06/04 1015) BP: 151/103 (06/04 1000) Pulse Rate: 123 (06/04 1000)  Labs: Recent Labs    10/19/22 1843 10/19/22 2155 10/20/22 0730 10/20/22 1105  HGB 11.4*  --   --   --   HCT 36.5*  --   --   --   PLT 125*  --   --   --   HEPARINUNFRC  --   --   --  0.42  CREATININE 1.32*  --  1.31*  --   TROPONINIHS 32* 35*  --   --      Estimated Creatinine Clearance: 43.3 mL/min (A) (by C-G formula based on SCr of 1.31 mg/dL (H)).   Medical History: Past Medical History:  Diagnosis Date   Abdominal aneurysm without mention of rupture    a. s/p stent graft 2015 by [facility].   Acute venous embolism and thrombosis of unspecified deep vessels of lower extremity    Anxiety    CAD (coronary artery disease)    a. s/p CABG 1994, redo 2005. b. [place] 03/2013: 2/3 patent bpg. s/p PTCA/DES to distal LM and PTCA/DES to distal LCx.   Chronic combined systolic and diastolic CHF (congestive heart failure) (HCC)    Esophageal reflux    Hiatal hernia    HISTORY   HTN (hypertension)    Hyperlipidemia    Ischemic cardiomyopathy    a. EF history: 2011: 35-40%, 11/2012: 45-50%. b. 03/2013: 35% by cath.   Mild mitral regurgitation    Myocardial infarction [facility])    Other specified gastritis without mention of hemorrhage    Premature atrial contractions    Pulmonary embolism (HCC) 04/2016   PVC's (premature ventricular contractions)    Renal calculus    history   Spinal stenosis, unspecified region other than cervical    Stomach pain  June 2013   Hospital stay at [facility]   Stroke [facility])     Medications:  (Not in a hospital admission) Scheduled:   carvedilol  3.125 mg Oral BID WC   simvastatin  40 mg Oral QHS   sodium chloride flush  3 mL Intravenous Q12H   Assessment: 89 yoM presents with SOB. He has been experiencing SOB and generalized weakness for a couple weeks. Has probable history of paroxysmal atrial fibrillation but no recent problems documented. He saw his doctor earlier today for these symptoms and there were technical problems so they could not get an EKG or additional testing, but the primary care doctor was suspicious that the patient was volume overloaded and in A-fib, recommended to come to the ER. EKG showed atrial fibrillation. Pharmacy consulted to dose heparin for atrial fibrillation. Hgb 11.4, plts 125, no PTA anticoagulant identified and confirmed with patient per RN. No signs/symptoms of bleeding reported.  Initial heparin level therapeutic on 1100 units/hr  Goal of Therapy:  Heparin level 0.3-0.7 units/ml Monitor platelets by anticoagulation protocol: Yes   Plan:  Continue heparin gtt at 1100 units/hr Daily heparin level, CBC, s/s bleeding F/u long term AC plan  Daylene Posey, PharmD, BCEMP Clinical Pharmacist ED  Pharmacist Phone # [phone] 10/20/2022 11:55 AM

## 2022-10-20 NOTE — ED Notes (Signed)
ED TO INPATIENT HANDOFF REPORT  ED Nurse Name and Phone #: Clista Bernhardt Name/Age/Gender Jonathan Arroyo 89 y.o. male Room/Bed: 021C/021C  Code Status   Code Status: Full Code  Home/SNF/Other Home Patient oriented to: self, place, time, and situation Is this baseline? Yes   Triage Complete: Triage complete  Chief Complaint Atrial fibrillation with RVR (HCC) [I48.91]  Triage Note Pt arrives with c/o SOB that started 2 weeks ago. Pt endorses CP. Pt denies n/v. Pt seen PCP and was advised to be evaluated.    Allergies Allergies  Allergen Reactions   Alum & Mag Hydroxide-Simeth Other (See Comments)    Unknown (Mylanta)   Oxycodone-Acetaminophen Other (See Comments)    unknown    Level of Care/Admitting Diagnosis ED Disposition     ED Disposition  Admit   Condition  --   Comment  Hospital Area: [facility] [[id number]]  Level of Care: Progressive [102]  Admit to Progressive based on following criteria: CARDIOVASCULAR & THORACIC of moderate stability with acute coronary syndrome symptoms/low risk myocardial infarction/hypertensive urgency/arrhythmias/heart failure potentially compromising stability and stable post cardiovascular intervention patients.  May admit patient to [facility] or [facility] if equivalent level of care is available:: Yes  Covid Evaluation: Asymptomatic - no recent exposure (last 10 days) testing not required  Diagnosis: Atrial fibrillation with RVR [facility]) [[id number]]  Admitting Physician: Briscoe Deutscher [[id number]]  Attending Physician: Briscoe Deutscher [[id number]]  Certification:: I certify this patient will need inpatient services for at least 2 midnights  Estimated Length of Stay: 3          B Medical/Surgery History Past Medical History:  Diagnosis Date   Abdominal aneurysm without mention of rupture    a. s/p stent graft 2015 by [facility].   Acute venous embolism and thrombosis of unspecified deep vessels of  lower extremity    Anxiety    CAD (coronary artery disease)    a. s/p CABG 1994, redo 2005. b. [place] 03/2013: 2/3 patent bpg. s/p PTCA/DES to distal LM and PTCA/DES to distal LCx.   Chronic combined systolic and diastolic CHF (congestive heart failure) (HCC)    Esophageal reflux    Hiatal hernia    HISTORY   HTN (hypertension)    Hyperlipidemia    Ischemic cardiomyopathy    a. EF history: 2011: 35-40%, 11/2012: 45-50%. b. 03/2013: 35% by cath.   Mild mitral regurgitation    Myocardial infarction [facility])    Other specified gastritis without mention of hemorrhage    Premature atrial contractions    Pulmonary embolism (HCC) 04/2016   PVC's (premature ventricular contractions)    Renal calculus    history   Spinal stenosis, unspecified region other than cervical    Stomach pain June 2013   Hospital stay at [facility]   Stroke [facility])    Past Surgical History:  Procedure Laterality Date   ABDOMINAL AORTIC ANEURYSM REPAIR     ABDOMINAL AORTIC ENDOVASCULAR STENT GRAFT N/A 03/14/2014   Procedure: ABDOMINAL AORTIC ENDOVASCULAR STENT GRAFT;  Surgeon: Sherren Kerns, MD;  Location: [facility] OR;  Service: Vascular;  Laterality: N/A;   BACK SURGERY     CORONARY ANGIOPLASTY WITH STENT PLACEMENT  04/10/2013   DES LEFT MAIN DES LEFT CIRCUMFLEX    DR MCALHANY   CORONARY ARTERY BYPASS GRAFT  1994 and  2005   HIATAL HERNIA REPAIR     IR GENERIC HISTORICAL  07/01/2016   IR RADIOLOGIST EVAL & MGMT  07/01/2016 Malachy Moan, MD [facility] INTERV RAD   IR GENERIC HISTORICAL  07/24/2016   IR ANGIOGRAM SELECTIVE EACH ADDITIONAL VESSEL 07/24/2016 Malachy Moan, MD [facility]   IR GENERIC HISTORICAL  07/24/2016   IR ANGIOGRAM SELECTIVE EACH ADDITIONAL VESSEL 07/24/2016 Malachy Moan, MD [facility]   IR GENERIC HISTORICAL  07/24/2016   IR ANGIOGRAM VISCERAL SELECTIVE 07/24/2016 Malachy Moan, MD [facility]   IR GENERIC HISTORICAL  07/24/2016   IR US GUIDE VASC ACCESS RIGHT 07/24/2016 Malachy Moan, MD [facility]    IR GENERIC HISTORICAL  07/24/2016   IR ANGIOGRAM SELECTIVE EACH ADDITIONAL VESSEL 07/24/2016 Malachy Moan, MD [facility]   IR GENERIC HISTORICAL  07/24/2016   IR ANGIOGRAM PELVIS SELECTIVE OR SUPRASELECTIVE 07/24/2016 Malachy Moan, MD [facility]   IR GENERIC HISTORICAL  07/24/2016   IR ANGIOGRAM SELECTIVE EACH ADDITIONAL VESSEL 07/24/2016 Malachy Moan, MD [facility]   IR GENERIC HISTORICAL  07/24/2016   IR EMBO ARTERIAL NOT HEMORR HEMANG INC GUIDE ROADMAPPING 07/24/2016 Malachy Moan, MD [facility]   IR RADIOLOGIST EVAL & MGMT  08/27/2016   IR RADIOLOGIST EVAL & MGMT  02/09/2017   JOINT REPLACEMENT  2005   Right knee   LEFT HEART CATHETERIZATION WITH CORONARY ANGIOGRAM N/A 04/10/2013   Procedure: LEFT HEART CATHETERIZATION WITH CORONARY ANGIOGRAM;  Surgeon: Kathleene Hazel, MD;  Location: [facility] CATH LAB;  Service: Cardiovascular;  Laterality: N/A;   PTCA     RIGHT/LEFT HEART CATH AND CORONARY/GRAFT ANGIOGRAPHY N/A 07/02/2017   Procedure: RIGHT/LEFT HEART CATH AND CORONARY/GRAFT ANGIOGRAPHY;  Surgeon: Kathleene Hazel, MD;  Location: [facility] INVASIVE CV LAB;  Service: Cardiovascular;  Laterality: N/A;   ROTATOR CUFF REPAIR Left    SPINE SURGERY     X's 3   TOTAL KNEE ARTHROPLASTY     right     A IV Location/Drains/Wounds Patient Lines/Drains/Airways Status     Active Line/Drains/Airways     Name Placement date Placement time Site Days   Peripheral IV 10/20/22 20 G 1.16" Anterior;Right;Upper Arm 10/20/22  0130  Arm  less than 1   Peripheral IV 10/20/22 22 G Anterior;Right Forearm 10/20/22  0204  Forearm  less than 1   Sheath 07/02/17 Right Arterial;Femoral 07/02/17  1000  Arterial;Femoral  1936   Sheath 07/02/17 Right Venous;Femoral 07/02/17  1000  Venous;Femoral  1936   Incision (Closed) 03/14/14 Groin Right 03/14/14  0922  -- 3142   Incision (Closed) 03/14/14 Groin Left 03/14/14  1201  -- 3142   Wound / Incision (Open or Dehisced) 07/24/16 Puncture Groin Right  07/24/16  1210  Groin  2279            Intake/Output Last 24 hours  Intake/Output Summary (Last 24 hours) at 10/20/2022 1330 Last data filed at 10/20/2022 1108 Gross per 24 hour  Intake --  Output 3450 ml  Net -3450 ml    Labs/Imaging Results for orders placed or performed during the hospital encounter of 10/19/22 (from the past 48 hour(s))  Basic metabolic panel     Status: Abnormal   Collection Time: 10/19/22  6:43 PM  Result Value Ref Range   Sodium 138 135 - 145 mmol/L   Potassium 4.9 3.5 - 5.1 mmol/L   Chloride 105 98 - 111 mmol/L   CO2 24 22 - 32 mmol/L   Glucose, Bld 88 70 - 99 mg/dL    Comment: Glucose reference range applies only to samples taken after fasting for at least 8 hours.   BUN 18 8 -  23 mg/dL   Creatinine, Ser 4.69 (H) 0.61 - 1.24 mg/dL   Calcium 9.1 8.9 - 62.9 mg/dL   GFR, Estimated 51 (L) >60 mL/min    Comment: (NOTE) Calculated using the CKD-EPI Creatinine Equation (2021)    Anion gap 9 5 - 15    Comment: Performed at [facility], [address], [place], [place] [address]  CBC     Status: Abnormal   Collection Time: 10/19/22  6:43 PM  Result Value Ref Range   WBC 6.2 4.0 - 10.5 K/uL   RBC 3.67 (L) 4.22 - 5.81 MIL/uL   Hemoglobin 11.4 (L) 13.0 - 17.0 g/dL   HCT 32.4 (L) 40.1 - 02.7 %   MCV 99.5 80.0 - 100.0 fL   MCH 31.1 26.0 - 34.0 pg   MCHC 31.2 30.0 - 36.0 g/dL   RDW 25.3 66.4 - 40.3 %   Platelets 125 (L) 150 - 400 K/uL   nRBC 0.0 0.0 - 0.2 %    Comment: Performed at [facility], [address], [place], [place] [address]  Troponin I (High Sensitivity)     Status: Abnormal   Collection Time: 10/19/22  6:43 PM  Result Value Ref Range   Troponin I (High Sensitivity) 32 (H) <18 ng/L    Comment: (NOTE) Elevated high sensitivity troponin I (hsTnI) values and significant  changes across serial measurements may suggest ACS but many other  chronic and acute conditions are known to elevate hsTnI results.  Refer to the "Links"  section for chest pain algorithms and additional  guidance. Performed at [facility], [address], [place], [place] [address]   Brain natriuretic peptide     Status: Abnormal   Collection Time: 10/19/22  6:43 PM  Result Value Ref Range   B Natriuretic Peptide 638.4 (H) 0.0 - 100.0 pg/mL    Comment: Performed at [facility], [address] [place], [place] [address]  Troponin I (High Sensitivity)     Status: Abnormal   Collection Time: 10/19/22  9:55 PM  Result Value Ref Range   Troponin I (High Sensitivity) 35 (H) <18 ng/L    Comment: (NOTE) Elevated high sensitivity troponin I (hsTnI) values and significant  changes across serial measurements may suggest ACS but many other  chronic and acute conditions are known to elevate hsTnI results.  Refer to the "Links" section for chest pain algorithms and additional  guidance. Performed at [facility], [address], [place], [place] [address]   TSH     Status: None   Collection Time: 10/20/22  7:30 AM  Result Value Ref Range   TSH 2.789 0.350 - 4.500 uIU/mL    Comment: Performed by a 3rd Generation assay with a functional sensitivity of <=0.01 uIU/mL. Performed at [facility], [address] [place], [place] [address]   Basic metabolic panel     Status: Abnormal   Collection Time: 10/20/22  7:30 AM  Result Value Ref Range   Sodium 140 135 - 145 mmol/L   Potassium 3.6 3.5 - 5.1 mmol/L   Chloride 102 98 - 111 mmol/L   CO2 25 22 - 32 mmol/L   Glucose, Bld 85 70 - 99 mg/dL    Comment: Glucose reference range applies only to samples taken after fasting for at least 8 hours.   BUN 18 8 - 23 mg/dL   Creatinine, Ser 6.06 (H) 0.61 - 1.24 mg/dL   Calcium  9.6 8.9 - 10.3 mg/dL   GFR, Estimated 52 (L) >60 mL/min    Comment: (NOTE) Calculated using the CKD-EPI Creatinine Equation (2021)    Anion gap 13 5 - 15    Comment: Performed at [facility], [address] [place], [place] [address]  Magnesium      Status: None   Collection Time: 10/20/22  7:30 AM  Result Value Ref Range   Magnesium 2.0 1.7 - 2.4 mg/dL    Comment: Performed at [facility], [address], [place], [place] [address]  Heparin level (unfractionated)     Status: None   Collection Time: 10/20/22 11:05 AM  Result Value Ref Range   Heparin Unfractionated 0.42 0.30 - 0.70 IU/mL    Comment: (NOTE) The clinical reportable range upper limit is being lowered to >1.10 to align with the FDA approved guidance for the current laboratory assay.  If heparin results are below expected values, and patient dosage has  been confirmed, suggest follow up testing of antithrombin III levels. Performed at [facility], [address] [place], [place] [address]    DG Chest 2 View  Result Date: 10/19/2022 CLINICAL DATA:  Chest pain and shortness of breath. EXAM: CHEST - 2 VIEW COMPARISON:  February 15, 2017 FINDINGS: Multiple sternal wires and vascular clips are seen. There is marked severity calcification of the aortic arch and tortuosity of the descending thoracic aorta. The heart size and mediastinal contours are within normal limits. There is stable mild to moderate severity elevation of the right hemidiaphragm. There is no evidence of an acute infiltrate, pleural effusion or pneumothorax. Multilevel degenerative changes are seen throughout the thoracic spine. A vascular stent is noted within the visualized portion of the abdomen on the lateral view. IMPRESSION: 1. Evidence of prior median sternotomy/CABG. 2. No acute cardiopulmonary disease. Electronically Signed   By: Aram Candela M.D.   On: 10/19/2022 19:32    Pending Labs Unresulted Labs (From admission, onward)     Start     Ordered   10/21/22 0500  Heparin level (unfractionated)  Daily,   R      10/20/22 0207   10/21/22 0500  CBC  Daily,   R      10/20/22 0207   10/20/22 0500  Basic metabolic panel  Daily,   R      10/20/22 0341             Vitals/Pain Today's Vitals   10/20/22 0700 10/20/22 1000 10/20/22 1015 10/20/22 1200  BP: 136/87 (!) 151/103  (!) 147/124  Pulse: (!) 115 (!) 123  92  Resp: 17 (!) 22  (!) 21  Temp:   97.8 F (36.6 C)   TempSrc:   Oral   SpO2: 97% 97%  97%  Weight:      Height:      PainSc:        Isolation Precautions No active isolations  Medications Medications  heparin ADULT infusion 100 units/mL (25000 units/218mL) (1,100 Units/hr Intravenous New Bag/Given 10/20/22 0219)  carvedilol (COREG) tablet 3.125 mg (3.125 mg Oral Given 10/20/22 0436)  simvastatin (ZOCOR) tablet 40 mg (has no administration in time range)  sodium chloride flush (NS) 0.9 % injection 3 mL (3 mLs Intravenous Given 10/20/22 1105)  acetaminophen (TYLENOL) tablet 650 mg (has no administration in time range)    Or  acetaminophen (TYLENOL) suppository 650 mg (has no administration in time range)  amiodarone (NEXTERONE) 1.8 mg/mL load via  infusion 150 mg (150 mg Intravenous Bolus from Bag 10/20/22 0212)  furosemide (LASIX) injection 80 mg (80 mg Intravenous Given 10/20/22 0205)  heparin bolus via infusion 2,000 Units (2,000 Units Intravenous Bolus from Bag 10/20/22 0220)    Mobility walks with device     Focused Assessments Cardiac Assessment Handoff:  Cardiac Rhythm: Atrial fibrillation No results found for: "CKTOTAL", "CKMB", "CKMBINDEX", "TROPONINI" No results found for: "DDIMER" Does the Patient currently have chest pain? No    R Recommendations: See Admitting Provider Note  Report given to:   Additional Notes:

## 2022-10-20 NOTE — H&P (Signed)
History and Physical    Jonathan Arroyo:[id number] DOB: [birth year] DOA: 10/19/2022  PCP: Gordan Payment., MD   Patient coming from: Home   Chief Complaint: SOB   HPI: Jonathan Arroyo is a pleasant 89 y.o. male with medical history significant for hypertension, hyperlipidemia, CAD status post CABG, AAA status post stent graft, ischemic cardiomyopathy, and PAF no longer anticoagulated presents to the ED with shortness of breath.  Patient reports at least 2 weeks of progressive dyspnea and generalized weakness.  He was initially only short of breath with exertion and when bending over to tie his shoes, but has reached the point where he is short of breath even at rest.  He denies any chest pain and has not been experiencing palpitations.  He denies history of pathologic bleeding.  He has had a minimal cough but denies subjective fever or chills.  He reports chronic swelling of the lower left leg but has more recently noticed bilateral lower extremity edema.  He saw his PCP for these complaints yesterday, was found to be in atrial fibrillation, appeared to be hypervolemic, and was directed to the ED.  ED Course: Upon arrival to the ED, patient is found to be afebrile and saturating mid 90s on room air with elevated heart rate and stable blood pressure.  EKG demonstrates rapid atrial fibrillation.  Chest x-ray is negative for acute cardiopulmonary disease.  Labs are most notable for creatinine 1.32, platelets 125,000, BNP 638, and troponin of 32 and then 35.  Cardiology was consulted by the ED physician and the patient was given 80 mg IV Lasix and started on IV heparin.  Review of Systems:  All other systems reviewed and apart from HPI, are negative.  Past Medical History:  Diagnosis Date   Abdominal aneurysm without mention of rupture    a. s/p stent graft 2015 by [facility].   Acute venous embolism and thrombosis of unspecified deep vessels of lower extremity    Anxiety    CAD  (coronary artery disease)    a. s/p CABG 1994, redo 2005. b. [place] 03/2013: 2/3 patent bpg. s/p PTCA/DES to distal LM and PTCA/DES to distal LCx.   Chronic combined systolic and diastolic CHF (congestive heart failure) (HCC)    Esophageal reflux    Hiatal hernia    HISTORY   HTN (hypertension)    Hyperlipidemia    Ischemic cardiomyopathy    a. EF history: 2011: 35-40%, 11/2012: 45-50%. b. 03/2013: 35% by cath.   Mild mitral regurgitation    Myocardial infarction [facility])    Other specified gastritis without mention of hemorrhage    Premature atrial contractions    Pulmonary embolism (HCC) 04/2016   PVC's (premature ventricular contractions)    Renal calculus    history   Spinal stenosis, unspecified region other than cervical    Stomach pain June 2013   Hospital stay at [facility]   Stroke [facility])     Past Surgical History:  Procedure Laterality Date   ABDOMINAL AORTIC ANEURYSM REPAIR     ABDOMINAL AORTIC ENDOVASCULAR STENT GRAFT N/A 03/14/2014   Procedure: ABDOMINAL AORTIC ENDOVASCULAR STENT GRAFT;  Surgeon: Sherren Kerns, MD;  Location: [facility] OR;  Service: Vascular;  Laterality: N/A;   BACK SURGERY     CORONARY ANGIOPLASTY WITH STENT PLACEMENT  04/10/2013   DES LEFT MAIN DES LEFT CIRCUMFLEX    DR MCALHANY   CORONARY ARTERY BYPASS GRAFT  1994 and  2005   HIATAL HERNIA REPAIR  IR GENERIC HISTORICAL  07/01/2016   IR RADIOLOGIST EVAL & MGMT 07/01/2016 Malachy Moan, MD [facility] INTERV RAD   IR GENERIC HISTORICAL  07/24/2016   IR ANGIOGRAM SELECTIVE EACH ADDITIONAL VESSEL 07/24/2016 Malachy Moan, MD [facility]   IR GENERIC HISTORICAL  07/24/2016   IR ANGIOGRAM SELECTIVE EACH ADDITIONAL VESSEL 07/24/2016 Malachy Moan, MD [facility]   IR GENERIC HISTORICAL  07/24/2016   IR ANGIOGRAM VISCERAL SELECTIVE 07/24/2016 Malachy Moan, MD [facility]   IR GENERIC HISTORICAL  07/24/2016   IR US GUIDE VASC ACCESS RIGHT 07/24/2016 Malachy Moan, MD [facility]   IR GENERIC HISTORICAL  07/24/2016    IR ANGIOGRAM SELECTIVE EACH ADDITIONAL VESSEL 07/24/2016 Malachy Moan, MD [facility]   IR GENERIC HISTORICAL  07/24/2016   IR ANGIOGRAM PELVIS SELECTIVE OR SUPRASELECTIVE 07/24/2016 Malachy Moan, MD [facility]   IR GENERIC HISTORICAL  07/24/2016   IR ANGIOGRAM SELECTIVE EACH ADDITIONAL VESSEL 07/24/2016 Malachy Moan, MD [facility]   IR GENERIC HISTORICAL  07/24/2016   IR EMBO ARTERIAL NOT HEMORR HEMANG INC GUIDE ROADMAPPING 07/24/2016 Malachy Moan, MD [facility]   IR RADIOLOGIST EVAL & MGMT  08/27/2016   IR RADIOLOGIST EVAL & MGMT  02/09/2017   JOINT REPLACEMENT  2005   Right knee   LEFT HEART CATHETERIZATION WITH CORONARY ANGIOGRAM N/A 04/10/2013   Procedure: LEFT HEART CATHETERIZATION WITH CORONARY ANGIOGRAM;  Surgeon: Kathleene Hazel, MD;  Location: [facility] CATH LAB;  Service: Cardiovascular;  Laterality: N/A;   PTCA     RIGHT/LEFT HEART CATH AND CORONARY/GRAFT ANGIOGRAPHY N/A 07/02/2017   Procedure: RIGHT/LEFT HEART CATH AND CORONARY/GRAFT ANGIOGRAPHY;  Surgeon: Kathleene Hazel, MD;  Location: [facility] INVASIVE CV LAB;  Service: Cardiovascular;  Laterality: N/A;   ROTATOR CUFF REPAIR Left    SPINE SURGERY     X's 3   TOTAL KNEE ARTHROPLASTY     right    Social History:   reports that he has never smoked. He has never used smokeless tobacco. He reports that he does not drink alcohol and does not use drugs.  Allergies  Allergen Reactions   Alum & Mag Hydroxide-Simeth Other (See Comments)    Unknown (Mylanta)   Oxycodone-Acetaminophen Other (See Comments)    unknown    Family History  Problem Relation Age of Onset   Coronary artery disease Mother    Diabetes Mother    Heart disease Mother    Hyperlipidemia Mother    Hypertension Mother    Heart attack Mother    Heart disease Father    Hyperlipidemia Father    Hypertension Father    Heart attack Father    Stroke Neg Hx      Prior to Admission medications   Medication Sig Start Date End Date Taking?  Authorizing Provider  aspirin EC 325 MG tablet Take 1 tablet (325 mg total) by mouth daily. 06/15/17   Micki Riley, MD  busPIRone (BUSPAR) 10 MG tablet Take 10 mg by mouth 2 (two) times daily as needed (for anxiety).     [provider]  carvedilol (COREG) 6.25 MG tablet Take 1 tablet by mouth twice daily for heart. *needs appointment for refills* 04/24/22   Kathleene Hazel, MD  finasteride (PROSCAR) 5 MG tablet Take 5 mg by mouth daily.      [provider]  furosemide (LASIX) 20 MG tablet Take 20 mg by mouth daily as needed for fluid.    [provider]  gabapentin (NEURONTIN) 300 MG capsule Take 1  capsule by mouth daily. 04/29/18   [provider]  lisinopril (PRINIVIL,ZESTRIL) 40 MG tablet Take 20 mg by mouth 2 (two) times daily.  12/24/10   Herby Abraham, MD  meclizine (ANTIVERT) 12.5 MG tablet Take 12.5 mg by mouth 3 (three) times daily as needed for dizziness.    [provider]  nitroGLYCERIN (NITROSTAT) 0.4 MG SL tablet Place 1 tablet (0.4 mg total) under the tongue every 5 (five) minutes as needed for chest pain (MAX 3 TABLETS). 10/09/19   Kathleene Hazel, MD  polyethylene glycol (MIRALAX / Ethelene Hal) packet Take 17 g by mouth daily as needed for mild constipation.    [provider]  simvastatin (ZOCOR) 40 MG tablet Take 40 mg by mouth at bedtime.     [provider]  traMADol (ULTRAM) 50 MG tablet Take 50 mg by mouth 2 (two) times daily as needed for moderate pain.  09/22/16   [provider]  zolpidem (AMBIEN) 10 MG tablet Take 10 mg by mouth at bedtime.     [provider]    Physical Exam: Vitals:   10/20/22 0130 10/20/22 0145 10/20/22 0200 10/20/22 0215  BP: (!) 146/115 (!) 147/105 (!) 142/102 (!) 154/115  Pulse: (!) 140 (!) 50 (!) 125 (!) 128  Resp: 19 19 (!) 21 18  Temp:      TempSrc:      SpO2: 98% 100% 99% 99%  Weight:      Height:         Constitutional: NAD, calm   Eyes: PERTLA, lids and conjunctivae normal ENMT: Mucous membranes are moist. Posterior pharynx clear of any exudate or lesions.   Neck: supple, no masses  Respiratory: no wheezing, no crackles. No accessory muscle use.  Cardiovascular: Rate ~120 and irregularly irregular. Pretibial pitting edema bilaterally.   Abdomen: No distension, no tenderness, soft. Bowel sounds active.  Musculoskeletal: no clubbing / cyanosis. No joint deformity upper and lower extremities.   Skin: no significant rashes, lesions, ulcers. Warm, dry, well-perfused. Neurologic: CN 2-12 grossly intact. Moving all extremities. Alert and oriented.  Psychiatric: Pleasant. Cooperative.    Labs and Imaging on Admission: I have personally reviewed following labs and imaging studies  CBC: Recent Labs  Lab 10/19/22 1843  WBC 6.2  HGB 11.4*  HCT 36.5*  MCV 99.5  PLT 125*   Basic Metabolic Panel: Recent Labs  Lab 10/19/22 1843  NA 138  K 4.9  CL 105  CO2 24  GLUCOSE 88  BUN 18  CREATININE 1.32*  CALCIUM 9.1   GFR: Estimated Creatinine Clearance: 42.9 mL/min (A) (by C-G formula based on SCr of 1.32 mg/dL (H)). Liver Function Tests: No results for input(s): "AST", "ALT", "ALKPHOS", "BILITOT", "PROT", "ALBUMIN" in the last 168 hours. No results for input(s): "LIPASE", "AMYLASE" in the last 168 hours. No results for input(s): "AMMONIA" in the last 168 hours. Coagulation Profile: No results for input(s): "INR", "PROTIME" in the last 168 hours. Cardiac Enzymes: No results for input(s): "CKTOTAL", "CKMB", "CKMBINDEX", "TROPONINI" in the last 168 hours. BNP (last 3 results) No results for input(s): "PROBNP" in the last 8760 hours. HbA1C: No results for input(s): "HGBA1C" in the last 72 hours. CBG: No results for input(s): "GLUCAP" in the last 168 hours. Lipid Profile: No results for input(s): "CHOL", "HDL", "LDLCALC", "TRIG", "CHOLHDL", "LDLDIRECT" in the last 72 hours. Thyroid Function Tests: No results for  input(s): "TSH", "T4TOTAL", "FREET4", "T3FREE", "THYROIDAB" in the last 72 hours. Anemia Panel: No results for input(s): "  VITAMINB12", "FOLATE", "FERRITIN", "TIBC", "IRON", "RETICCTPCT" in the last 72 hours. Urine analysis:    Component Value Date/Time   COLORURINE YELLOW 03/09/2014 1334   APPEARANCEUR CLEAR 03/09/2014 1334   LABSPEC 1.021 03/09/2014 1334   PHURINE 6.0 03/09/2014 1334   GLUCOSEU NEGATIVE 03/09/2014 1334   HGBUR SMALL (A) 03/09/2014 1334   BILIRUBINUR NEGATIVE 03/09/2014 1334   KETONESUR NEGATIVE 03/09/2014 1334   PROTEINUR NEGATIVE 03/09/2014 1334   UROBILINOGEN 0.2 03/09/2014 1334   NITRITE NEGATIVE 03/09/2014 1334   LEUKOCYTESUR NEGATIVE 03/09/2014 1334   Sepsis Labs: @LABRCNTIP (procalcitonin:4,lacticidven:4) )No results found for this or any previous visit (from the past 240 hour(s)).   Radiological Exams on Admission: DG Chest 2 View  Result Date: 10/19/2022 CLINICAL DATA:  Chest pain and shortness of breath. EXAM: CHEST - 2 VIEW COMPARISON:  February 15, 2017 FINDINGS: Multiple sternal wires and vascular clips are seen. There is marked severity calcification of the aortic arch and tortuosity of the descending thoracic aorta. The heart size and mediastinal contours are within normal limits. There is stable mild to moderate severity elevation of the right hemidiaphragm. There is no evidence of an acute infiltrate, pleural effusion or pneumothorax. Multilevel degenerative changes are seen throughout the thoracic spine. A vascular stent is noted within the visualized portion of the abdomen on the lateral view. IMPRESSION: 1. Evidence of prior median sternotomy/CABG. 2. No acute cardiopulmonary disease. Electronically Signed   By: Aram Candela M.D.   On: 10/19/2022 19:32    EKG: Independently reviewed. Atrial fibrillation, rate 125, LAFB.   Assessment/Plan   1. Atrial fibrillation with RVR  - Appreciate cardiology assessment and recommendations  - CHADS-VASc is 45  (age x2, HTN, CAD, CHF, TIA x2)  - Continue IV heparin for now, continue Coreg, check TSH, update echocardiogram, keep NPO should he need TEE/DCCV    2. Acute on chronic HFpEF  - Given 80 mg IV Lasix in ED   - Monitor weight and strict I/Os, update echocardiogram    3. CAD - No anginal complaints, mild and flat troponin elevation likely d/t supply/demand mismatch in setting of rapid a fib   - Continue statin and beta-blocker    4. CKD 3A  - SCr is 1.32 on admission; was 1.11 in April 2024   - Renally-dose medications, monitor     DVT prophylaxis: IV heparin  Code Status: Full  Level of Care: Level of care: Progressive Family Communication: none present  Disposition Plan:  Patient is from: home  Anticipated d/c is to: home  Anticipated d/c date is: 10/22/22  Patient currently: Pending rate-control, improved volume status  Consults called: Cardiology  Admission status: Inpatient     Briscoe Deutscher, MD [facility]  10/20/2022, 3:41 AM

## 2022-10-20 NOTE — Progress Notes (Addendum)
ANTICOAGULATION CONSULT NOTE - Initial Consult  Pharmacy Consult for heparin Indication: atrial fibrillation  Allergies  Allergen Reactions   Alum & Mag Hydroxide-Simeth Other (See Comments)    Unknown (Mylanta)   Oxycodone-Acetaminophen Other (See Comments)    unknown    Patient Measurements: Height: 6\' 2"  (188 cm) Weight: 81.6 kg (180 lb) IBW/kg (Calculated) : 82.2 Heparin Dosing Weight: 81.6 kg  Vital Signs: Temp: 97.9 F (36.6 C) (06/03 2326) Temp Source: Oral (06/03 2326) BP: 135/83 (06/03 2326) Pulse Rate: 109 (06/03 2326)  Labs: Recent Labs    10/19/22 1843 10/19/22 2155  HGB 11.4*  --   HCT 36.5*  --   PLT 125*  --   CREATININE 1.32*  --   TROPONINIHS 32* 35*    Estimated Creatinine Clearance: 42.9 mL/min (A) (by C-G formula based on SCr of 1.32 mg/dL (H)).   Medical History: Past Medical History:  Diagnosis Date   Abdominal aneurysm without mention of rupture    a. s/p stent graft 2015 by [facility].   Acute venous embolism and thrombosis of unspecified deep vessels of lower extremity    Anxiety    CAD (coronary artery disease)    a. s/p CABG 1994, redo 2005. b. [place] 03/2013: 2/3 patent bpg. s/p PTCA/DES to distal LM and PTCA/DES to distal LCx.   Chronic combined systolic and diastolic CHF (congestive heart failure) (HCC)    Esophageal reflux    Hiatal hernia    HISTORY   HTN (hypertension)    Hyperlipidemia    Ischemic cardiomyopathy    a. EF history: 2011: 35-40%, 11/2012: 45-50%. b. 03/2013: 35% by cath.   Mild mitral regurgitation    Myocardial infarction [facility])    Other specified gastritis without mention of hemorrhage    Premature atrial contractions    Pulmonary embolism (HCC) 04/2016   PVC's (premature ventricular contractions)    Renal calculus    history   Spinal stenosis, unspecified region other than cervical    Stomach pain June 2013   Hospital stay at [facility]   Stroke [facility])     Medications:  (Not in a hospital admission)   Scheduled:   amiodarone  150 mg Intravenous Once   furosemide  80 mg Intravenous Once   Assessment: Jonathan Arroyo presents with SOB. He has been experiencing SOB and generalized weakness for a couple weeks. Has probable history of paroxysmal atrial fibrillation but no recent problems documented. He saw his doctor earlier today for these symptoms and there were technical problems so they could not get an EKG or additional testing, but the primary care doctor was suspicious that the patient was volume overloaded and in A-fib, recommended to come to the ER. EKG showed atrial fibrillation. Pharmacy consulted to dose heparin for atrial fibrillation. Hgb 11.4, plts 125, no PTA anticoagulant identified and confirmed with patient per RN. No signs/symptoms of bleeding reported.  Goal of Therapy:  Heparin level 0.3-0.7 units/ml Monitor platelets by anticoagulation protocol: Yes   Plan:  Give heparin bolus of 2000 units  Start heparin infusion at 1100 units/hr Check anti-Xa level in 8 hours and daily while on heparin Continue to monitor H&H and platelets Monitor for signs/symptoms of bleeding  Thanks,   Arabella Merles, PharmD. [facility] Acute Care PGY-1 10/20/2022 2:04 AM

## 2022-10-20 NOTE — Consult Note (Addendum)
Cardiology Consultation   Patient ID: QUENTION SCURA MRN: [id number]; DOB: 1930-04-03  Admit date: 10/19/2022 Date of Consult: 10/20/2022  PCP:  Gordan Payment., MD   [facility] Providers Cardiologist:  Verne Carrow, MD        Patient Profile:   Jonathan Arroyo is a 89 y.o. male with a hx of HLD, HTN, GERD, CAD s/p CABG (1994 redo in 2005), AAA s/p aortic stent graft, ischemic cardiomyopathy, MR, hiatal hernia, DVT (not on [facility]) who is being seen 10/20/2022 for the evaluation of atrial fibrillation with RVR at the request of Dr. Blinda Leatherwood.  History of Present Illness:   Jonathan Arroyo is a 89 y.o. male with a hx of HLD, HTN, GERD, CAD s/p CABG (1994 redo in 2005), ischemic cardiomyopathy, MR, hiatal hernia, DVT (not on [facility]) who is being seen 10/20/2022 for the evaluation of atrial fibrillation with RVR at the request of Dr. Blinda Leatherwood.  Previously followed with Dr. Clifton James as his cardiologist in the outpatient setting, last seen 10/09/19. Had PACs and PVCs back then but no atrial fibrillation that was actively being followed.  Was lost to follow-up since then. He states that he was in his USOH until about 2-4 weeks ago when he started noticing dyspnea with simple ADLs like tying his shoes and putting on his clothes. No palpitations. Eventually decided to see his PCP (at [facility]) for subacute dyspnea and also complained of intermittent CP at that visit. His PCP was able to do an EKG, which showed atrial fibrillation so referred him to the ER for treatment.   In the ED, afebrile, HF 110-140s, BP stable in the 130-140/80s, satting on RA. Started on heparin gtt for anticoagulation and and amiodarone 150mg  IV bolus and amio gtt. He otherwise notes that he feels about his normal self when he's just laying in bed. Denies SOB, CP, abdominal pain, palpitations, orthopnea, or PND. Does have BLE edema that he thinks probably started about 1 month ago. No recent sick contacts, fevers,  or chills. Denies prior stroke, but has a chart history of prior TIA.  Prior to his PCP visit today, there are prior notes of remote Afib in 2018 (appears to have been on xarelto back then, but then was potentially replaced by aspirin 325mg  QD). He does not remember that far back and doesn't recall if he was on a blood thinner or not. He denies any prior clinically-significant bleeding, melena, or BRBPR. Last took his coreg this morning.  Past Medical History:  Diagnosis Date   Abdominal aneurysm without mention of rupture    a. s/p stent graft 2015 by [facility].   Acute venous embolism and thrombosis of unspecified deep vessels of lower extremity    Anxiety    CAD (coronary artery disease)    a. s/p CABG 1994, redo 2005. b. [place] 03/2013: 2/3 patent bpg. s/p PTCA/DES to distal LM and PTCA/DES to distal LCx.   Chronic combined systolic and diastolic CHF (congestive heart failure) (HCC)    Esophageal reflux    Hiatal hernia    HISTORY   HTN (hypertension)    Hyperlipidemia    Ischemic cardiomyopathy    a. EF history: 2011: 35-40%, 11/2012: 45-50%. b. 03/2013: 35% by cath.   Mild mitral regurgitation    Myocardial infarction [facility])    Other specified gastritis without mention of hemorrhage    Premature atrial contractions    Pulmonary embolism (HCC) 04/2016   PVC's (premature ventricular contractions)  Renal calculus    history   Spinal stenosis, unspecified region other than cervical    Stomach pain June 2013   Hospital stay at [facility]   Stroke [facility])     Past Surgical History:  Procedure Laterality Date   ABDOMINAL AORTIC ANEURYSM REPAIR     ABDOMINAL AORTIC ENDOVASCULAR STENT GRAFT N/A 03/14/2014   Procedure: ABDOMINAL AORTIC ENDOVASCULAR STENT GRAFT;  Surgeon: Sherren Kerns, MD;  Location: [facility] OR;  Service: Vascular;  Laterality: N/A;   BACK SURGERY     CORONARY ANGIOPLASTY WITH STENT PLACEMENT  04/10/2013   DES LEFT MAIN DES LEFT CIRCUMFLEX    DR MCALHANY   CORONARY  ARTERY BYPASS GRAFT  1994 and  2005   HIATAL HERNIA REPAIR     IR GENERIC HISTORICAL  07/01/2016   IR RADIOLOGIST EVAL & MGMT 07/01/2016 Malachy Moan, MD [facility] INTERV RAD   IR GENERIC HISTORICAL  07/24/2016   IR ANGIOGRAM SELECTIVE EACH ADDITIONAL VESSEL 07/24/2016 Malachy Moan, MD [facility]   IR GENERIC HISTORICAL  07/24/2016   IR ANGIOGRAM SELECTIVE EACH ADDITIONAL VESSEL 07/24/2016 Malachy Moan, MD [facility]   IR GENERIC HISTORICAL  07/24/2016   IR ANGIOGRAM VISCERAL SELECTIVE 07/24/2016 Malachy Moan, MD [facility]   IR GENERIC HISTORICAL  07/24/2016   IR US GUIDE VASC ACCESS RIGHT 07/24/2016 Malachy Moan, MD [facility]   IR GENERIC HISTORICAL  07/24/2016   IR ANGIOGRAM SELECTIVE EACH ADDITIONAL VESSEL 07/24/2016 Malachy Moan, MD [facility]   IR GENERIC HISTORICAL  07/24/2016   IR ANGIOGRAM PELVIS SELECTIVE OR SUPRASELECTIVE 07/24/2016 Malachy Moan, MD [facility]   IR GENERIC HISTORICAL  07/24/2016   IR ANGIOGRAM SELECTIVE EACH ADDITIONAL VESSEL 07/24/2016 Malachy Moan, MD [facility]   IR GENERIC HISTORICAL  07/24/2016   IR EMBO ARTERIAL NOT HEMORR HEMANG INC GUIDE ROADMAPPING 07/24/2016 Malachy Moan, MD [facility]   IR RADIOLOGIST EVAL & MGMT  08/27/2016   IR RADIOLOGIST EVAL & MGMT  02/09/2017   JOINT REPLACEMENT  2005   Right knee   LEFT HEART CATHETERIZATION WITH CORONARY ANGIOGRAM N/A 04/10/2013   Procedure: LEFT HEART CATHETERIZATION WITH CORONARY ANGIOGRAM;  Surgeon: Kathleene Hazel, MD;  Location: [facility] CATH LAB;  Service: Cardiovascular;  Laterality: N/A;   PTCA     RIGHT/LEFT HEART CATH AND CORONARY/GRAFT ANGIOGRAPHY N/A 07/02/2017   Procedure: RIGHT/LEFT HEART CATH AND CORONARY/GRAFT ANGIOGRAPHY;  Surgeon: Kathleene Hazel, MD;  Location: [facility] INVASIVE CV LAB;  Service: Cardiovascular;  Laterality: N/A;   ROTATOR CUFF REPAIR Left    SPINE SURGERY     X's 3   TOTAL KNEE ARTHROPLASTY     right     Home Medications:  Prior to  Admission medications   Medication Sig Start Date End Date Taking? Authorizing Provider  aspirin EC 325 MG tablet Take 1 tablet (325 mg total) by mouth daily. 06/15/17   Micki Riley, MD  busPIRone (BUSPAR) 10 MG tablet Take 10 mg by mouth 2 (two) times daily as needed (for anxiety).     [provider]  carvedilol (COREG) 6.25 MG tablet Take 1 tablet by mouth twice daily for heart. *needs appointment for refills* 04/24/22   Kathleene Hazel, MD  finasteride (PROSCAR) 5 MG tablet Take 5 mg by mouth daily.      [provider]  furosemide (LASIX) 20 MG tablet Take 20 mg by mouth daily as needed for fluid.    [provider]  gabapentin (NEURONTIN) 300 MG capsule Take  1 capsule by mouth daily. 04/29/18   [provider]  lisinopril (PRINIVIL,ZESTRIL) 40 MG tablet Take 20 mg by mouth 2 (two) times daily.  12/24/10   Herby Abraham, MD  meclizine (ANTIVERT) 12.5 MG tablet Take 12.5 mg by mouth 3 (three) times daily as needed for dizziness.    [provider]  nitroGLYCERIN (NITROSTAT) 0.4 MG SL tablet Place 1 tablet (0.4 mg total) under the tongue every 5 (five) minutes as needed for chest pain (MAX 3 TABLETS). 10/09/19   Kathleene Hazel, MD  polyethylene glycol (MIRALAX / Ethelene Hal) packet Take 17 g by mouth daily as needed for mild constipation.    [provider]  simvastatin (ZOCOR) 40 MG tablet Take 40 mg by mouth at bedtime.     [provider]  traMADol (ULTRAM) 50 MG tablet Take 50 mg by mouth 2 (two) times daily as needed for moderate pain.  09/22/16   [provider]  zolpidem (AMBIEN) 10 MG tablet Take 10 mg by mouth at bedtime.     [provider]    Inpatient Medications: Scheduled Meds:   Continuous Infusions:  heparin 1,100 Units/hr (10/20/22 0219)   PRN Meds:   Allergies:    Allergies  Allergen Reactions   Alum & Mag Hydroxide-Simeth Other (See Comments)    Unknown (Mylanta)    Oxycodone-Acetaminophen Other (See Comments)    unknown    Social History:   Social History   Socioeconomic History   Marital status: Married    Spouse name: Not on file   Number of children: Not on file   Years of education: Not on file   Highest education level: Not on file  Occupational History   Not on file  Tobacco Use   Smoking status: Never   Smokeless tobacco: Never  Substance and Sexual Activity   Alcohol use: No    Alcohol/week: 0.0 standard drinks of alcohol   Drug use: No   Sexual activity: Not on file  Other Topics Concern   Not on file  Social History Narrative   Not on file   Social Determinants of Health   Financial Resource Strain: Not on file  Food Insecurity: Not on file  Transportation Needs: Not on file  Physical Activity: Not on file  Stress: Not on file  Social Connections: Not on file  Intimate Partner Violence: Not on file    Family History:   Family History  Problem Relation Age of Onset   Coronary artery disease Mother    Diabetes Mother    Heart disease Mother    Hyperlipidemia Mother    Hypertension Mother    Heart attack Mother    Heart disease Father    Hyperlipidemia Father    Hypertension Father    Heart attack Father    Stroke Neg Hx      ROS:  Please see the history of present illness.  All other ROS reviewed and negative.     Physical Exam/Data:   Vitals:   10/20/22 0130 10/20/22 0145 10/20/22 0200 10/20/22 0215  BP: (!) 146/115 (!) 147/105 (!) 142/102 (!) 154/115  Pulse: (!) 140 (!) 50 (!) 125 (!) 128  Resp: 19 19 (!) 21 18  Temp:      TempSrc:      SpO2: 98% 100% 99% 99%  Weight:      Height:       No intake or output data in the 24 hours ending 10/20/22 0226  10/19/2022    6:37 PM 10/09/2019   10:00 AM 07/18/2018    1:37 PM  Last 3 Weights  Weight (lbs) 180 lb 180 lb 186 lb 1.9 oz  Weight (kg) 81.647 kg 81.647 kg 84.423 kg     Body mass index is 23.11 kg/m.  General:  Elderly gentleman in  NAD HEENT: normal Neck: mildly elevated JVP at 30 degrees Vascular: No carotid bruits; Distal pulses 2+ bilaterally Cardiac:  normal S1, S2; irregularly irregular rhythm Lungs:  clear to auscultation bilaterally, no wheezing, rhonchi or rales, CTAB Abd: soft, nontender, no hepatomegaly  Ext: 1+ BLE edema Musculoskeletal:  No deformities, BUE and BLE strength normal and equal Skin: warm and dry  Neuro:  CNs 2-12 intact, no focal abnormalities noted Psych:  Normal affect   EKG:  The EKG was personally reviewed and demonstrates:  atrial fibrillation with RVR Telemetry:  Telemetry was personally reviewed and demonstrates:  atrial fibrillation with RVR  Relevant CV Studies:  Coronary angiography 06/2017: Prox RCA lesion is 100% stenosed. Right radial artery graft was visualized by angiography and is normal in caliber and anatomically normal. The graft exhibits no disease. Ost LAD to Prox LAD lesion is 100% stenosed. Previously placed Mid LM to Dist LM stent (unknown type) is widely patent. Previously placed Ost Cx to Prox Cx stent (unknown type) is widely patent. Previously placed Ost 3rd Mrg stent (unknown type) is widely patent. Origin lesion is 100% stenosed. SVG graft was visualized by angiography. Origin to Prox Graft lesion is 100% stenosed. LIMA graft was visualized by angiography and is normal in caliber.   1. Severe triple vessel CAD with 2 patent bypass grafts 2. The LAD is chronically occluded at the ostium. The LIMA to the mid LAD is patent 3. The stent extending into from the left main into the circumflex is patent. The OM2 stent is patent. Both grafts to the OM branches are known to be occluded and not injected 4. Proximal RCA chronically occluded. Free radial graft to the PDA is patent.  5. Normal filling pressures  TTE 03/2017: - Left ventricle: Abnormal septal motion The cavity size was    normal. Wall thickness was increased in a pattern of severe LVH.    Systolic  function was normal. The estimated ejection fraction was    in the range of 50% to 55%.  - Mitral valve: Calcified annulus. Mildly thickened leaflets .    There was mild regurgitation.  - Left atrium: The atrium was moderately dilated.  - Atrial septum: No defect or patent foramen ovale was identified.   Laboratory Data:  High Sensitivity Troponin:   Recent Labs  Lab 10/19/22 1843 10/19/22 2155  TROPONINIHS 32* 35*     Chemistry Recent Labs  Lab 10/19/22 1843  NA 138  K 4.9  CL 105  CO2 24  GLUCOSE 88  BUN 18  CREATININE 1.32*  CALCIUM 9.1  GFRNONAA 51*  ANIONGAP 9    No results for input(s): "PROT", "ALBUMIN", "AST", "ALT", "ALKPHOS", "BILITOT" in the last 168 hours. Lipids No results for input(s): "CHOL", "TRIG", "HDL", "LABVLDL", "LDLCALC", "CHOLHDL" in the last 168 hours.  Hematology Recent Labs  Lab 10/19/22 1843  WBC 6.2  RBC 3.67*  HGB 11.4*  HCT 36.5*  MCV 99.5  MCH 31.1  MCHC 31.2  RDW 13.9  PLT 125*   Thyroid No results for input(s): "TSH", "FREET4" in the last 168 hours.  BNP Recent Labs  Lab 10/19/22 1843  BNP 638.4*  DDimer No results for input(s): "DDIMER" in the last 168 hours.   Radiology/Studies:  DG Chest 2 View  Result Date: 10/19/2022 CLINICAL DATA:  Chest pain and shortness of breath. EXAM: CHEST - 2 VIEW COMPARISON:  February 15, 2017 FINDINGS: Multiple sternal wires and vascular clips are seen. There is marked severity calcification of the aortic arch and tortuosity of the descending thoracic aorta. The heart size and mediastinal contours are within normal limits. There is stable mild to moderate severity elevation of the right hemidiaphragm. There is no evidence of an acute infiltrate, pleural effusion or pneumothorax. Multilevel degenerative changes are seen throughout the thoracic spine. A vascular stent is noted within the visualized portion of the abdomen on the lateral view. IMPRESSION: 1. Evidence of prior median  sternotomy/CABG. 2. No acute cardiopulmonary disease. Electronically Signed   By: Aram Candela M.D.   On: 10/19/2022 19:32     Assessment and Plan:   Jonathan Arroyo is a 89 y.o. male with a hx of HLD, HTN, GERD, CAD s/p CABG (1994 redo in 2005), AAA s/p aortic stent graft, ischemic cardiomyopathy, MR, hiatal hernia, DVT (not on [facility]) who is being seen 10/20/2022 for the evaluation of atrial fibrillation with RVR at the request of Dr. Blinda Leatherwood.  Appears to have had atrial fibrillation documented back in 2018, was on xarelto then, but subsequently no longer on [facility] for unclear reasons (potentially looks like it had been replaced by aspirin 325mg  QD). He denies a personal history of GI bleeding. He now presents for subacute dyspnea and found to be in atrial fibrillation with RVR with new AKI and some evidence of mild volume overload. His CHADsVASC is 40 (age, CHF, HTN, prior MI, prior TIA). Given no prior anticoagulation use, would recommend against rhythm control agents for now to prevent unintentional chemical cardioversion in a gentleman with high CHADsVASC.  He is tachycardiac to 140s though appears to be relatively asymptomatic. He is getting IV diuresis for his mild volume overload. Will restart his home coreg to help with rate control and BP control as well. He does not appear to be in decompensated HF so I believe he can tolerate his home BB.   His hs-troponins are 32 -> 35, which I suspect are related to type 2 NSTEMI in the setting of his tachyarrhythemia. He has no active dyspnea or chest pain.  Recommendations: - Agree with heparin gtt for [facility] for now (hold home aspirin while on full AC) - Recommend against using any rhythm control agents for his atrial fibrillation given not previously on anticoagulation - Continue home coreg 3.125mg  BID for rate control (per his CareEverywhere records, he takes a lower dose than what we have recorded with [facility]) - S/p IV lasix 80mg  in the ED, hold off  on anymore standing diuretic to observe UOP response (home diuretic dose is lasix PO 20mg  PRN) - Recommend TTE in the AM to assess interval changes in his cardiac function (ordered) - Recommend TSH to eval for thyroid disease (ordered) - Please make NPO in case needs TEE/DCCV in the morning  Risk Assessment/Risk Scores:      New York Heart Association (NYHA) Functional Class NYHA Class II  CHA2DS2-VASc Score =   7  This indicates a 11.2% annual risk of stroke. The patient's score is based upon: age, HTN, CHF, prior MI, prior TIA     For questions or updates, please contact [facility] Please consult [email or web address] for contact info under  Signed, Bella Kennedy, MD  10/20/2022 2:26 AM

## 2022-10-20 NOTE — Hospital Course (Addendum)
89 y.o.m w/ HTN HLD CAD s/p CABG, AAA S/P stent graft, ischemic cardiomyopathy, and PAF no longer anticoagulated presented to the ED with shortness of breath, generalized weakness x 2 wk. He reports chronic swelling of the lower left leg but has more recently noticed bilateral lower extremity edema. He saw his PCP-found to be in atrial fibrillation, appeared to be hypervolemic, so sent to the ED. ZO:XWRU tachycardia BP stable, not hypoxic. Labs with fairly mild creatinine at 1.3 platelet 125 elevated BNP 638 troponin 32> 35 otherwise unremarkable. Chest x-ray no acute finding. Cardiology was consulted by the ED physician and the patient was given 80 mg IV Lasix and started on IV heparin. TTE done lvef at 40%,The left ventricle demonstrates global hypokinesis with akinesis of the basal inferior and basal inferolateral walls. There is moderate concentric left ventricular hypertrophy. Left ventricular diastolic parameters are indeterminate.  6/6:his medication has been adjusted and on metoprolol 150 mg daily, off Cardizem and on Eliquis.  Overall doing well.hctz was added for BP along with kdur.

## 2022-10-20 NOTE — Progress Notes (Signed)
Echocardiogram 2D Echocardiogram has been performed.  Jonathan Arroyo 10/20/2022, 3:39 PM

## 2022-10-21 ENCOUNTER — Other Ambulatory Visit (HOSPITAL_COMMUNITY): Payer: Self-pay

## 2022-10-21 ENCOUNTER — Encounter (HOSPITAL_COMMUNITY): Payer: Self-pay | Admitting: Family Medicine

## 2022-10-21 DIAGNOSIS — I2510 Atherosclerotic heart disease of native coronary artery without angina pectoris: Secondary | ICD-10-CM

## 2022-10-21 DIAGNOSIS — I4891 Unspecified atrial fibrillation: Secondary | ICD-10-CM | POA: Diagnosis not present

## 2022-10-21 LAB — BASIC METABOLIC PANEL
Anion gap: 11 (ref 5–15)
BUN: 22 mg/dL (ref 8–23)
CO2: 29 mmol/L (ref 22–32)
Calcium: 9.7 mg/dL (ref 8.9–10.3)
Chloride: 100 mmol/L (ref 98–111)
Creatinine, Ser: 1.41 mg/dL — ABNORMAL HIGH (ref 0.61–1.24)
GFR, Estimated: 47 mL/min — ABNORMAL LOW (ref >60)
Glucose, Bld: 101 mg/dL — ABNORMAL HIGH (ref 70–99)
Potassium: 3.6 mmol/L (ref 3.5–5.1)
Sodium: 140 mmol/L (ref 135–145)

## 2022-10-21 LAB — CBC
HCT: 40.5 % (ref 39.0–52.0)
Hemoglobin: 12.8 g/dL — ABNORMAL LOW (ref 13.0–17.0)
MCH: 31.1 pg (ref 26.0–34.0)
MCHC: 31.6 g/dL (ref 30.0–36.0)
MCV: 98.5 fL (ref 80.0–100.0)
Platelets: 143 10*3/uL — ABNORMAL LOW (ref 150–400)
RBC: 4.11 MIL/uL — ABNORMAL LOW (ref 4.22–5.81)
RDW: 13.7 % (ref 11.5–15.5)
WBC: 7.6 10*3/uL (ref 4.0–10.5)
nRBC: 0 % (ref 0.0–0.2)

## 2022-10-21 LAB — LIPID PANEL
Cholesterol: 114 mg/dL (ref 0–200)
HDL: 33 mg/dL — ABNORMAL LOW (ref >40)
LDL Cholesterol: 62 mg/dL (ref 0–99)
Total CHOL/HDL Ratio: 3.5 RATIO
Triglycerides: 94 mg/dL (ref <150)
VLDL: 19 mg/dL (ref 0–40)

## 2022-10-21 MED ORDER — MECLIZINE HCL 12.5 MG PO TABS: 12.5000 mg | ORAL_TABLET | Freq: Three times a day (TID) | ORAL | Status: DC | PRN

## 2022-10-21 MED ORDER — PANTOPRAZOLE SODIUM 40 MG PO TBEC
40.0000 mg | DELAYED_RELEASE_TABLET | Freq: Every day | ORAL | Status: DC
  Administered 2022-10-21 – 2022-10-22 (×2): 40 mg via ORAL
  Filled 2022-10-21 (×2): qty 1

## 2022-10-21 MED ORDER — NITROGLYCERIN 0.4 MG SL SUBL: 0.4000 mg | SUBLINGUAL_TABLET | SUBLINGUAL | Status: DC | PRN

## 2022-10-21 MED ORDER — GABAPENTIN 400 MG PO CAPS
400.0000 mg | ORAL_CAPSULE | Freq: Two times a day (BID) | ORAL | Status: DC
  Administered 2022-10-21 – 2022-10-22 (×3): 400 mg via ORAL
  Filled 2022-10-21: qty 4
  Filled 2022-10-21 (×2): qty 1

## 2022-10-21 MED ORDER — ZOLPIDEM TARTRATE 5 MG PO TABS
5.0000 mg | ORAL_TABLET | Freq: Every day | ORAL | Status: DC
  Administered 2022-10-21: 5 mg via ORAL
  Filled 2022-10-21: qty 1

## 2022-10-21 MED ORDER — METOPROLOL TARTRATE 50 MG PO TABS
50.0000 mg | ORAL_TABLET | Freq: Two times a day (BID) | ORAL | Status: DC
  Administered 2022-10-21 – 2022-10-22 (×3): 50 mg via ORAL
  Filled 2022-10-21 (×3): qty 1

## 2022-10-21 MED ORDER — ISOSORBIDE MONONITRATE ER 30 MG PO TB24
30.0000 mg | ORAL_TABLET | Freq: Every day | ORAL | Status: DC
  Administered 2022-10-21 – 2022-10-22 (×2): 30 mg via ORAL
  Filled 2022-10-21 (×2): qty 1

## 2022-10-21 MED ORDER — POLYETHYLENE GLYCOL 3350 17 G PO PACK: 17.0000 g | PACK | Freq: Every day | ORAL | Status: DC | PRN

## 2022-10-21 MED ORDER — ZOLPIDEM TARTRATE 5 MG PO TABS: 5.0000 mg | ORAL_TABLET | Freq: Every evening | ORAL | Status: DC | PRN

## 2022-10-21 MED ORDER — TRAMADOL HCL 50 MG PO TABS
50.0000 mg | ORAL_TABLET | Freq: Two times a day (BID) | ORAL | Status: DC | PRN
  Administered 2022-10-21: 50 mg via ORAL
  Filled 2022-10-21: qty 1

## 2022-10-21 MED ORDER — TAMSULOSIN HCL 0.4 MG PO CAPS
0.4000 mg | ORAL_CAPSULE | Freq: Every day | ORAL | Status: DC
  Administered 2022-10-21 – 2022-10-22 (×2): 0.4 mg via ORAL
  Filled 2022-10-21 (×2): qty 1

## 2022-10-21 NOTE — Evaluation (Signed)
Physical Therapy Evaluation Patient Details Name: Jonathan Arroyo MRN: [id number] DOB: [birth year] Today's Date: 10/21/2022  History of Present Illness  Jonathan Arroyo is a 88 y.o. male admitted 10/19/22 with SOB and generalized weakness.  Found to be in a-fib with hypervolemia.  PMH significant for hypertension, hyperlipidemia, CAD status post CABG, AAA status post stent graft, ischemic cardiomyopathy, and PAF no longer anticoagulated.  Clinical Impression  Patient presents with decreased mobility due to decreased balance, decreased activity tolerance, decreased safety awareness and high risk for falls now on anticoagulation.  Wife and son present and hopeful he can get some more home services as wife cannot assist much and son is from [place].  Patient already receiving HHPT, would benefit from [facility] aide, and RN initially.  PT will follow up.        Recommendations for follow up therapy are one component of a multi-disciplinary discharge planning process, led by the attending physician.  Recommendations may be updated based on patient status, additional functional criteria and insurance authorization.  Follow Up Recommendations       Assistance Recommended at Discharge Intermittent Supervision/Assistance  Patient can return home with the following  A little help with walking and/or transfers;A little help with bathing/dressing/bathroom;Help with stairs or ramp for entrance;Assist for transportation;Assistance with cooking/housework;Direct supervision/assist for medications management    Equipment Recommendations None recommended by PT  Recommendations for Other Services       Functional Status Assessment Patient has had a recent decline in their functional status and demonstrates the ability to make significant improvements in function in a reasonable and predictable amount of time.     Precautions / Restrictions Precautions Precautions: Fall      Mobility  Bed Mobility                General bed mobility comments: up in chair    Transfers Overall transfer level: Needs assistance Equipment used: Straight cane Transfers: Sit to/from Stand Sit to Stand: Mod assist, Min assist           General transfer comment: using momentum, unable to stand with lifting assistance initially, then improved on second attempt up with min A.    Ambulation/Gait Ambulation/Gait assistance: Min guard Gait Distance (Feet): 90 Feet Assistive device: Rolling walker (2 wheels), Straight cane Gait Pattern/deviations: Step-through pattern, Step-to pattern, Trunk flexed, Wide base of support, Decreased stride length       General Gait Details: initially with hurry cane, then with walker after around bed due to unsteady with cane; pt denies SOB but c/o fatigue  Stairs            Wheelchair Mobility    Modified Rankin (Stroke Patients Only)       Balance Overall balance assessment: Needs assistance   Sitting balance-Leahy Scale: Good     Standing balance support: Single extremity supported Standing balance-Leahy Scale: Poor Standing balance comment: UE support in standing                             Pertinent Vitals/Pain Pain Assessment Pain Assessment: Faces Faces Pain Scale: Hurts little more Pain Location: none at rest, starts with claudication pain in legs with ambulation Pain Descriptors / Indicators: Aching Pain Intervention(s): Monitored during session, Limited activity within patient's tolerance    Home Living Family/patient expects to be discharged to:: Private residence Living Arrangements: Spouse/significant other Available Help at Discharge: Family Type of Home: House Home Access: Stairs  to enter;Ramped entrance       Home Layout: One level;Laundry or work area in basement (getting stair lift to basement) Home Equipment: Rollator (4 wheels);Cane - single point;Hand held shower head;Grab bars - tub/shower;Shower seat;Electric  scooter Additional Comments: has adjustable bed and lift chair and device to help lift him if he falls    Prior Function Prior Level of Function : Needs assist             Mobility Comments: uses cane or rollator, had about 2-3 falls in 6 months; limited walking due to LE pain (claudication?) ADLs Comments: usually complets unaided, also helps with dishes and sweeping     Hand Dominance   Dominant Hand: Right    Extremity/Trunk Assessment   Upper Extremity Assessment Upper Extremity Assessment: Overall WFL for tasks assessed    Lower Extremity Assessment Lower Extremity Assessment: Generalized weakness    Cervical / Trunk Assessment Cervical / Trunk Assessment: Kyphotic  Communication   Communication: HOH  Cognition Arousal/Alertness: Awake/alert Behavior During Therapy: WFL for tasks assessed/performed Overall Cognitive Status: History of cognitive impairments - at baseline                                          General Comments General comments (skin integrity, edema, etc.): Son and spouse present; son from [place], daughter lives close; wife concerned about home feeling he needs more home services.    Exercises     Assessment/Plan    PT Assessment Patient needs continued PT services  PT Problem List Decreased strength;Decreased activity tolerance;Decreased balance;Decreased mobility;Cardiopulmonary status limiting activity       PT Treatment Interventions DME instruction;Balance training;Gait training;Functional mobility training;Patient/family education;Therapeutic activities    PT Goals (Current goals can be found in the Care Plan section)  Acute Rehab PT Goals Patient Stated Goal: to get more help initially when at home PT Goal Formulation: With patient/family Time For Goal Achievement: 11/04/22 Potential to Achieve Goals: Fair    Frequency Min 1X/week     Co-evaluation               AM-PAC PT "6 Clicks" Mobility   Outcome Measure Help needed turning from your back to your side while in a flat bed without using bedrails?: A Lot Help needed moving from lying on your back to sitting on the side of a flat bed without using bedrails?: A Lot Help needed moving to and from a bed to a chair (including a wheelchair)?: A Lot Help needed standing up from a chair using your arms (e.g., wheelchair or bedside chair)?: A Lot Help needed to walk in hospital room?: A Little Help needed climbing 3-5 steps with a railing? : Total 6 Click Score: 12    End of Session Equipment Utilized During Treatment: Gait belt Activity Tolerance: Patient limited by fatigue Patient left: in chair;with call bell/phone within reach;with chair alarm set;with family/visitor present   PT Visit Diagnosis: Other abnormalities of gait and mobility (R26.89);Muscle weakness (generalized) (M62.81)    Time: 9604-5409 PT Time Calculation (min) (ACUTE ONLY): 29 min   Charges:   PT Evaluation $PT Eval Moderate Complexity: 1 Mod PT Treatments $Gait Training: 8-22 mins        Sheran Lawless, PT Acute Rehabilitation Services Office:[phone] 10/21/2022   Jonathan Arroyo 10/21/2022, 4:44 PM

## 2022-10-21 NOTE — Progress Notes (Signed)
On and off taking out cardiac monitor. Explained the importance of it. Continue to monitor.

## 2022-10-21 NOTE — Progress Notes (Signed)
Rounding Note    Patient Name: Jonathan Arroyo Date of Encounter: 10/21/2022  [facility] Cardiologist: Verne Carrow, MD    Subjective   89 year old gentleman with a history of coronary artery disease, coronary artery bypass grafting, abdominal aortic aneurysm repair with an aortic stent graft, mitral regurgitation.  He was admitted with rapid atrial fibrillation.  He was started on IV Cardizem.  Carvedilol was changed to metoprolol.  He is feeling much better this morning and his heart rate is well-controlled.  He denies any chest pain or shortness of breath.  Troponin levels are 32, 35 and are consistent with demand ischemia.  These are not consistent with an acute coronary syndrome.  Echocardiogram from yesterday reveals LVEF of 40%.  This is mildly decreased from his previous echocardiogram in 2018.  I suspect this mild degree of LV dysfunction is due to his 63-month history of rapid atrial fibrillation.  Inpatient Medications    Scheduled Meds:  apixaban  5 mg Oral BID   metoprolol tartrate  25 mg Oral BID   mupirocin ointment  1 Application Nasal BID   simvastatin  40 mg Oral QHS   sodium chloride flush  3 mL Intravenous Q12H   Continuous Infusions:  diltiazem (CARDIZEM) infusion 7.5 mg/hr (10/21/22 0316)   PRN Meds: acetaminophen **OR** acetaminophen   Vital Signs    Vitals:   10/21/22 0328 10/21/22 0500 10/21/22 0800 10/21/22 0847  BP: (!) 145/102   (!) 143/87  Pulse:    83  Resp: (!) 27   19  Temp: 98.1 F (36.7 C)   97.6 F (36.4 C)  TempSrc:    Oral  SpO2:   94% 97%  Weight:  74.3 kg    Height:        Intake/Output Summary (Last 24 hours) at 10/21/2022 0912 Last data filed at 10/21/2022 0654 Gross per 24 hour  Intake 318.8 ml  Output 1550 ml  Net -1231.2 ml      10/21/2022    5:00 AM 10/20/2022    6:31 PM 10/19/2022    6:37 PM  Last 3 Weights  Weight (lbs) 163 lb 12.8 oz 163 lb 2.3 oz 180 lb  Weight (kg) 74.3 kg 74 kg 81.647 kg       Telemetry    Afib with v rate in 80s  - Personally Reviewed  ECG     - Personally Reviewed  Physical Exam   GEN: Elderly gentleman, no acute distress.   Neck: No JVD Cardiac: Irregularly irregular.  Soft systolic murmur. Respiratory: Clear to auscultation bilaterally. GI: Soft, nontender, non-distended  MS: No edema; No deformity. Neuro:  Nonfocal  Psych: Normal affect   Labs    High Sensitivity Troponin:   Recent Labs  Lab 10/19/22 1843 10/19/22 2155  TROPONINIHS 32* 35*     Chemistry Recent Labs  Lab 10/19/22 1843 10/20/22 0730 10/21/22 0037  NA 138 140 140  K 4.9 3.6 3.6  CL 105 102 100  CO2 24 25 29   GLUCOSE 88 85 101*  BUN 18 18 22   CREATININE 1.32* 1.31* 1.41*  CALCIUM 9.1 9.6 9.7  MG  --  2.0  --   GFRNONAA 51* 52* 47*  ANIONGAP 9 13 11     Lipids No results for input(s): "CHOL", "TRIG", "HDL", "LABVLDL", "LDLCALC", "CHOLHDL" in the last 168 hours.  Hematology Recent Labs  Lab 10/19/22 1843 10/21/22 0037  WBC 6.2 7.6  RBC 3.67* 4.11*  HGB 11.4* 12.8*  HCT  36.5* 40.5  MCV 99.5 98.5  MCH 31.1 31.1  MCHC 31.2 31.6  RDW 13.9 13.7  PLT 125* 143*   Thyroid  Recent Labs  Lab 10/20/22 0730  TSH 2.789    BNP Recent Labs  Lab 10/19/22 1843  BNP 638.4*    DDimer No results for input(s): "DDIMER" in the last 168 hours.   Radiology    ECHOCARDIOGRAM COMPLETE  Result Date: 10/20/2022    ECHOCARDIOGRAM REPORT   Patient Name:   Jonathan Arroyo Date of Exam: 10/20/2022 Medical Rec #:  [id number]        Height:       74.0 in Accession #:    [id number]       Weight:       180.0 lb Date of Birth:  [birth year]         BSA:          2.078 m Patient Age:    89 years         BP:           136/87 mmHg Patient Gender: M                HR:           80 bpm. Exam Location:  Inpatient Procedure: 2D Echo, Cardiac Doppler, Color Doppler and Intracardiac            Opacification Agent Indications:    Atrial Fibrillation I48.91  History:        Patient has prior  history of Echocardiogram examinations, most                 recent 03/18/2017. CHF, CAD and Angina, TIA, Arrythmias:Atrial                 Fibrillation; Risk Factors:Hypertension and Dyslipidemia. CKD,                 stage 3.  Sonographer:    Lucendia Herrlich Referring Phys: [id number] TIMOTHY S OPYD IMPRESSIONS  1. Left ventricular ejection fraction, by estimation, is 40%. The left ventricle has mild to moderately decreased function. The left ventricle demonstrates global hypokinesis with akinesis of the basal inferior and basal inferolateral walls. There is moderate concentric left ventricular hypertrophy. Left ventricular diastolic parameters are indeterminate.  2. Right ventricular systolic function is mildly reduced. The right ventricular size is mildly enlarged. There is moderately elevated pulmonary artery systolic pressure. The estimated right ventricular systolic pressure is 46.9 mmHg.  3. Left atrial size was severely dilated.  4. Right atrial size was moderately dilated.  5. The mitral valve is normal in structure. Mild to moderate mitral valve regurgitation. No evidence of mitral stenosis.  6. The aortic valve is tricuspid. There is moderate calcification of the aortic valve. Aortic valve regurgitation is trivial. Aortic valve sclerosis/calcification is present, without any evidence of aortic stenosis.  7. Aortic dilatation noted. There is borderline dilatation of the aortic root, measuring 37 mm. There is mild dilatation of the ascending aorta, measuring 41 mm.  8. The inferior vena cava is dilated in size with >50% respiratory variability, suggesting right atrial pressure of 8 mmHg.  9. The patient was in atrial fibrillation. FINDINGS  Left Ventricle: Left ventricular ejection fraction, by estimation, is 40%. The left ventricle has mild to moderately decreased function. The left ventricle demonstrates global hypokinesis. Definity contrast agent was given IV to delineate the left ventricular endocardial  borders. The left ventricular internal cavity size was normal  in size. There is moderate concentric left ventricular hypertrophy. Left ventricular diastolic parameters are indeterminate. Right Ventricle: The right ventricular size is mildly enlarged. No increase in right ventricular wall thickness. Right ventricular systolic function is mildly reduced. There is moderately elevated pulmonary artery systolic pressure. The tricuspid regurgitant velocity is 3.12 m/s, and with an assumed right atrial pressure of 8 mmHg, the estimated right ventricular systolic pressure is 46.9 mmHg. Left Atrium: Left atrial size was severely dilated. Right Atrium: Right atrial size was moderately dilated. Pericardium: There is no evidence of pericardial effusion. Mitral Valve: The mitral valve is normal in structure. There is mild calcification of the mitral valve leaflet(s). Mild to moderate mitral valve regurgitation. No evidence of mitral valve stenosis. Tricuspid Valve: The tricuspid valve is normal in structure. Tricuspid valve regurgitation is mild. Aortic Valve: The aortic valve is tricuspid. There is moderate calcification of the aortic valve. Aortic valve regurgitation is trivial. Aortic regurgitation PHT measures 577 msec. Aortic valve sclerosis/calcification is present, without any evidence of aortic stenosis. Aortic valve mean gradient measures 4.0 mmHg. Aortic valve peak gradient measures 7.2 mmHg. Aortic valve area, by VTI measures 1.34 cm. Pulmonic Valve: The pulmonic valve was normal in structure. Pulmonic valve regurgitation is trivial. Aorta: Aortic dilatation noted. There is borderline dilatation of the aortic root, measuring 37 mm. There is mild dilatation of the ascending aorta, measuring 41 mm. Venous: The inferior vena cava is dilated in size with greater than 50% respiratory variability, suggesting right atrial pressure of 8 mmHg. IAS/Shunts: No atrial level shunt detected by color flow Doppler.  LEFT VENTRICLE  PLAX 2D LVIDd:         5.80 cm   Diastology LVIDs:         5.70 cm   LV e' medial:    5.98 cm/s LV PW:         1.10 cm   LV E/e' medial:  11.9 LV IVS:        1.20 cm   LV e' lateral:   11.10 cm/s LVOT diam:     1.90 cm   LV E/e' lateral: 6.4 LV SV:         32 LV SV Index:   15 LVOT Area:     2.84 cm  RIGHT VENTRICLE             IVC RV S prime:     11.80 cm/s  IVC diam: 2.60 cm TAPSE (M-mode): 2.0 cm LEFT ATRIUM              Index        RIGHT ATRIUM           Index LA diam:        5.40 cm  2.60 cm/m   RA Area:     23.40 cm LA Vol (A2C):   110.0 ml 52.93 ml/m  RA Volume:   69.80 ml  33.59 ml/m LA Vol (A4C):   103.0 ml 49.56 ml/m LA Biplane Vol: 110.0 ml 52.93 ml/m  AORTIC VALVE                    PULMONIC VALVE AV Area (Vmax):    1.58 cm     PR End Diast Vel: 2.06 msec AV Area (Vmean):   1.51 cm AV Area (VTI):     1.34 cm AV Vmax:           134.50 cm/s AV Vmean:  91.200 cm/s AV VTI:            0.237 m AV Peak Grad:      7.2 mmHg AV Mean Grad:      4.0 mmHg LVOT Vmax:         74.90 cm/s LVOT Vmean:        48.567 cm/s LVOT VTI:          0.112 m LVOT/AV VTI ratio: 0.47 AI PHT:            577 msec  AORTA Ao Root diam: 3.70 cm Ao Asc diam:  4.10 cm MITRAL VALVE               TRICUSPID VALVE MV Area (PHT): 6.67 cm    TR Peak grad:   38.9 mmHg MR Peak grad: 111.5 mmHg   TR Vmax:        312.00 cm/s MR Vmax:      528.00 cm/s MV E velocity: 71.10 cm/s  SHUNTS MV A velocity: 52.30 cm/s  Systemic VTI:  0.11 m MV E/A ratio:  1.36        Systemic Diam: 1.90 cm Dalton McleanMD Electronically signed by Wilfred Lacy Signature Date/Time: 10/20/2022/4:19:47 PM    Final    DG Chest 2 View  Result Date: 10/19/2022 CLINICAL DATA:  Chest pain and shortness of breath. EXAM: CHEST - 2 VIEW COMPARISON:  February 15, 2017 FINDINGS: Multiple sternal wires and vascular clips are seen. There is marked severity calcification of the aortic arch and tortuosity of the descending thoracic aorta. The heart size and mediastinal  contours are within normal limits. There is stable mild to moderate severity elevation of the right hemidiaphragm. There is no evidence of an acute infiltrate, pleural effusion or pneumothorax. Multilevel degenerative changes are seen throughout the thoracic spine. A vascular stent is noted within the visualized portion of the abdomen on the lateral view. IMPRESSION: 1. Evidence of prior median sternotomy/CABG. 2. No acute cardiopulmonary disease. Electronically Signed   By: Aram Candela M.D.   On: 10/19/2022 19:32    Cardiac Studies     Patient Profile     89 y.o. male with new onset atrial fibrillation.  I suspect he is actually had this atrial fibrillation for several months which would explain his 71-month history of progressive shortness of breath and generalized fatigue.  Assessment & Plan    1.  Atrial fibrillation: His rate is now well-controlled on Cardizem 7.5 mg/h.  He is also on metoprolol 25 mg twice a day.  He has mild LV dysfunction so Cardizem long-term is not his  best choice.  We will increase his metoporolol to 50 mg twice a day and stop the Cardizem drip and see if he maintains good rate control. TSH is 2.789   At the time of discharge we can change his metoprolol to tartrate to metoprolol succinate.  He is on Eliquis .  I anticipate being able to DC him tomorrow if he remains stable . Will ambulate him later today   2.  Coronary artery disease: He denies any angina.  Troponin levels are only minimally elevated and are flat.  This is more consistent with demand ischemia from his rapid atrial fibrillation and is not suggestive of an acute coronary syndrome.   3.  Hyperlipidemia: He is currently on simvastatin 40 mg a day. His last lipid level in our system was from January 2019.  At that time his LDL was 63, HDL is 33,  total cholesterol is 130, triglyceride levels were 168.  Will repeat lipids.    For questions or updates, please contact [facility] Please consult [email or web address] for contact info under        Signed, Kristeen Miss, MD  10/21/2022, 9:12 AM

## 2022-10-21 NOTE — Progress Notes (Signed)
Heart Failure Nurse Navigator Progress Note  PCP: Gordan Payment., MD PCP-Cardiologist: Clifton James Admission Diagnosis: Atrial fibrillation with RVR, Acute on chronic congestive heart failure.  Admitted from: Home  Presentation:   Jonathan Arroyo presented with shortness of breath, x 2 weeks, chest pain, generalized weakness.. Family reports patient generally forgets to take his "water pill". BNP 638, Troponin 35, EKG showed Atrial fibrillation, IV amiodarone started,   Patient and family were educated on the sign and symptoms of heart failure, daily weights, when to call his doctor or go to the ED. Diet/ fluid restrictions, wife reported to using his salt and patient doesn't really like much besides meatball subs, Daughter feels he should be able to eat whatever he wants at 89 years old. Navigator went over nutritional suggestions for Heart Healthy eating habits, taking all medications as prescribed and attending all medical appointments. Patient and family verbalized their understanding of education, a HF TOC appointment was scheduled for 11/09/2022 @ 10 am.   ECHO/ LVEF: 40%  Clinical Course:  Past Medical History:  Diagnosis Date   Abdominal aneurysm without mention of rupture    a. s/p stent graft 2015 by [facility].   Acute venous embolism and thrombosis of unspecified deep vessels of lower extremity    Anxiety    CAD (coronary artery disease)    a. s/p CABG 1994, redo 2005. b. [place] 03/2013: 2/3 patent bpg. s/p PTCA/DES to distal LM and PTCA/DES to distal LCx.   Chronic combined systolic and diastolic CHF (congestive heart failure) (HCC)    Esophageal reflux    Hiatal hernia    HISTORY   HTN (hypertension)    Hyperlipidemia    Ischemic cardiomyopathy    a. EF history: 2011: 35-40%, 11/2012: 45-50%. b. 03/2013: 35% by cath.   Mild mitral regurgitation    Myocardial infarction [facility])    Other specified gastritis without mention of hemorrhage    Premature atrial contractions     Pulmonary embolism (HCC) 04/2016   PVC's (premature ventricular contractions)    Renal calculus    history   Spinal stenosis, unspecified region other than cervical    Stomach pain June 2013   [facility]ospital stay at [facility]   Stroke [facility])      Social History   Socioeconomic History   Marital status: Married    Spouse name: Fish farm manager   Number of children: 6   Years of education: Not on file   Highest education level: Not on file  Occupational History   Occupation: Retired  Tobacco Use   Smoking status: Never   Smokeless tobacco: Never  Vaping Use   Vaping Use: Never used  Substance and Sexual Activity   Alcohol use: No    Alcohol/week: 0.0 standard drinks of alcohol   Drug use: No   Sexual activity: Not on file  Other Topics Concern   Not on file  Social History Narrative   Not on file   Social Determinants of Health   Financial Resource Strain: Low Risk  (10/21/2022)   Overall Financial Resource Strain (CARDIA)    Difficulty of Paying Living Expenses: Not hard at all  Food Insecurity: No Food Insecurity (10/20/2022)   Hunger Vital Sign    Worried About Running Out of Food in the Last Year: Never true    Ran Out of Food in the Last Year: Never true  Transportation Needs: No Transportation Needs (10/20/2022)   PRAPARE - Administrator, Civil Service (Medical): No  Lack of Transportation (Non-Medical): No  Physical Activity: Not on file  Stress: Not on file  Social Connections: Not on file   Education Assessment and Provision:  Detailed education and instructions provided on heart failure disease management including the following:  Signs and symptoms of Heart Failure When to call the physician Importance of daily weights Low sodium diet Fluid restriction Medication management Anticipated future follow-up appointments  Patient education given on each of the above topics.  Patient acknowledges understanding via teach back method and acceptance of all  instructions.  Education Materials:  "Living Better With Heart Failure" Booklet, HF zone tool, & Daily Weight Tracker Tool.  Patient has scale at home: yes Patient has pill box at home: yes    High Risk Criteria for Readmission and/or Poor Patient Outcomes: Heart failure hospital admissions (last 6 months): 0  No Show rate: 1 % Difficult social situation: No, lives with wife Demonstrates medication adherence: Yes Primary Language: English Literacy level: Reading, writing, and comprehension  Barriers of Care:   Diet/ fluids ( salt intake) Daily weights Wife reported, sometimes forgets his medications  Considerations/Referrals:   Referral made to Heart Failure Pharmacist Stewardship: Yes Referral made to Heart Failure CSW/NCM TOC: No Referral made to Heart & Vascular TOC clinic: Yes, 11/09/2022 @ 10 am  Items for Follow-up on DC/TOC: Diet/ fluid restrictions ( salt intake) Daily weights Continued HF education   Rhae Hammock, BSN, RN Heart Failure Print production planner Chat Only

## 2022-10-21 NOTE — Progress Notes (Signed)
PROGRESS NOTE HIMANSHU CHRESTMAN  NFA:[id number] DOB: 1930/04/28 DOA: 10/19/2022 PCP: Gordan Payment., MD  Brief Narrative/Hospital Course: 89 y.o.m w/ HTN HLD CAD s/p CABG, AAA S/P stent graft, ischemic cardiomyopathy, and PAF no longer anticoagulated presented to the ED with shortness of breath, generalized weakness x 2 wk. He reports chronic swelling of the lower left leg but has more recently noticed bilateral lower extremity edema. He saw his PCP-found to be in atrial fibrillation, appeared to be hypervolemic, so sent to the ED. IO:NGEX tachycardia BP stable, not hypoxic. Labs with fairly mild creatinine at 1.3 platelet 125 elevated BNP 638 troponin 32> 35 otherwise unremarkable. Chest x-ray no acute finding. Cardiology was consulted by the ED physician and the patient was given 80 mg IV Lasix and started on IV heparin. TTE done lvef at 40%,The left ventricle demonstrates global hypokinesis with akinesis of the basal inferior and basal inferolateral walls. There is moderate concentric left ventricular hypertrophy. Left ventricular diastolic parameters are indeterminate.     Subjective: Patient seen and examined this morning.  He is resting comfortably No chest pain, remains in A-fib Overnight afebrile BP stable On RA   Assessment and Plan: Principal Problem:   Atrial fibrillation with RVR (HCC) Active Problems:   Coronary artery disease involving native coronary artery of native heart with unstable angina pectoris (HCC)   CKD stage 3a, GFR 45-59 ml/min (HCC)   Acute on chronic heart failure with preserved ejection fraction (HFpEF) (HCC)   A-fib with BMW:[id number] episode of A-fib was noted, was on Xarelto subsequently not taking for unclear reason.  Cardiology on board on heparin drip> transitioned to Eliquis, now changed to metoprolol.  Rate not well-controlled increasing metoprolol to 50 twice daily.  Continue to monitor on telemetry, TSH 2.7 and echo shows reduced EF likely from his  A-fib.  Ambulate with PT OT   Mild elevated troponin likely demand ischemia in the setting of A-fib RVR and CHF.  No further recommendation  Acute on chronic HFpEF: Got Lasix in the ED, follow-up echocardiogram w/ EF at 40%-likely from his A-fib.  Currently volume status stable.  CAD with previous CABG: Continue statin beta-blocker and eliquis.  CKD 3a baseline creatinine 1.1, now holding 1.3-1.4, monitor  Recent Labs    10/19/22 1843 10/20/22 0730 10/21/22 0037  BUN 18 18 22   CREATININE 1.32* 1.31* 1.41*  CO2 24 25 29     DVT prophylaxis: eliquis Code Status:   Code Status: Full Code Family Communication: plan of care discussed with patient at bedside. Patient status is: Inpatient because of A-fib Level of care: Progressive   Dispo: The patient is from: home            Anticipated disposition: Anticipate discharge tomorrow if remains stable Objective: Vitals last 24 hrs: Vitals:   10/21/22 0328 10/21/22 0500 10/21/22 0800 10/21/22 0847  BP: (!) 145/102   (!) 143/87  Pulse:    83  Resp: (!) 27   19  Temp: 98.1 F (36.7 C)   97.6 F (36.4 C)  TempSrc:    Oral  SpO2:   94% 97%  Weight:  74.3 kg    Height:       Weight change: -7.647 kg  Physical Examination:  General exam: alert awake, older than stated age HEENT:Oral mucosa moist, Ear/Nose WNL grossly Respiratory system: bilaterally clear BS, no use of accessory muscle Cardiovascular system: S1 & S2 +, irregular rhythm no JVD. Gastrointestinal system: Abdomen soft,NT,ND, BS+ Nervous System:Alert, awake, moving extremities.  Extremities: LE edema neg,distal peripheral pulses palpable.  Skin: No rashes,no icterus. MSK: Normal muscle bulk,tone, power  Medications reviewed:  Scheduled Meds:  apixaban  5 mg Oral BID   metoprolol tartrate  50 mg Oral BID   mupirocin ointment  1 Application Nasal BID   simvastatin  40 mg Oral QHS   sodium chloride flush  3 mL Intravenous Q12H   Continuous Infusions:    Diet  Order             Diet Heart Room service appropriate? Yes; Fluid consistency: Thin  Diet effective now                  Intake/Output Summary (Last 24 hours) at 10/21/2022 1046 Last data filed at 10/21/2022 0654 Gross per 24 hour  Intake 318.8 ml  Output 550 ml  Net -231.2 ml   Net IO Since Admission: -3,481.2 mL [10/21/22 1046]  Wt Readings from Last 3 Encounters:  10/21/22 74.3 kg  10/09/19 81.6 kg  07/18/18 84.4 kg     Unresulted Labs (From admission, onward)     Start     Ordered   10/21/22 0922  Lipid panel  Once,   R       Question:  Specimen collection method  Answer:  Lab=Lab collect   10/21/22 0921   10/21/22 0500  CBC  Daily,   R      10/20/22 0207   10/20/22 0500  Basic metabolic panel  Daily,   R      10/20/22 0341          Data Reviewed: I have personally reviewed following labs and imaging studies CBC: Recent Labs  Lab 10/19/22 1843 10/21/22 0037  WBC 6.2 7.6  HGB 11.4* 12.8*  HCT 36.5* 40.5  MCV 99.5 98.5  PLT 125* 143*   Basic Metabolic Panel: Recent Labs  Lab 10/19/22 1843 10/20/22 0730 10/21/22 0037  NA 138 140 140  K 4.9 3.6 3.6  CL 105 102 100  CO2 24 25 29   GLUCOSE 88 85 101*  BUN 18 18 22   CREATININE 1.32* 1.31* 1.41*  CALCIUM 9.1 9.6 9.7  MG  --  2.0  --    Recent Labs    10/20/22 0730  TSH 2.789  Sepsis Labs:No results for input(s): "PROCALCITON", "LATICACIDVEN" in the last 168 hours. Recent Results (from the past 240 hour(s))  MRSA Next Gen by PCR, Nasal     Status: Abnormal   Collection Time: 10/20/22  6:41 PM   Specimen: Nasal Mucosa; Nasal Swab  Result Value Ref Range Status   MRSA by PCR Next Gen DETECTED (A) NOT DETECTED Final    Comment: RESULT CALLED TO, READ BACK BY AND VERIFIED WITH: RN ANGIE HODGES ON 10/20/22 @ 2014 BY DRT (NOTE) The GeneXpert MRSA Assay (FDA approved for NASAL specimens only), is one component of a comprehensive MRSA colonization surveillance program. It is not intended to diagnose  MRSA infection nor to guide or monitor treatment for MRSA infections. Test performance is not FDA approved in patients less than 21 years old. Performed at [facility], [address], [place], [place] [address]   Antimicrobials: Anti-infectives (From admission, onward)    None      Culture/Microbiology No results found for: "SDES", "SPECREQUEST", "CULT", "REPTSTATUS"   Radiology Studies: ECHOCARDIOGRAM COMPLETE  Result Date: 10/20/2022    ECHOCARDIOGRAM REPORT   Patient Name:   Jonathan Arroyo Date of Exam: 10/20/2022 Medical Rec #:  [id number]        Height:       74.0 in Accession #:    [id number]       Weight:       180.0 lb Date of Birth:  22-Apr-1930         BSA:          2.078 m Patient Age:    89 years         BP:           136/87 mmHg Patient Gender: M                HR:           80 bpm. Exam Location:  Inpatient Procedure: 2D Echo, Cardiac Doppler, Color Doppler and Intracardiac            Opacification Agent Indications:    Atrial Fibrillation I48.91  History:        Patient has prior history of Echocardiogram examinations, most                 recent 03/18/2017. CHF, CAD and Angina, TIA, Arrythmias:Atrial                 Fibrillation; Risk Factors:Hypertension and Dyslipidemia. CKD,                 stage 3.  Sonographer:    Lucendia Herrlich Referring Phys: [id number] TIMOTHY S OPYD IMPRESSIONS  1. Left ventricular ejection fraction, by estimation, is 40%. The left ventricle has mild to moderately decreased function. The left ventricle demonstrates global hypokinesis with akinesis of the basal inferior and basal inferolateral walls. There is moderate concentric left ventricular hypertrophy. Left ventricular diastolic parameters are indeterminate.  2. Right ventricular systolic function is mildly reduced. The right ventricular size is mildly enlarged. There is moderately elevated pulmonary artery systolic pressure. The estimated right ventricular systolic pressure is 46.9 mmHg.  3. Left  atrial size was severely dilated.  4. Right atrial size was moderately dilated.  5. The mitral valve is normal in structure. Mild to moderate mitral valve regurgitation. No evidence of mitral stenosis.  6. The aortic valve is tricuspid. There is moderate calcification of the aortic valve. Aortic valve regurgitation is trivial. Aortic valve sclerosis/calcification is present, without any evidence of aortic stenosis.  7. Aortic dilatation noted. There is borderline dilatation of the aortic root, measuring 37 mm. There is mild dilatation of the ascending aorta, measuring 41 mm.  8. The inferior vena cava is dilated in size with >50% respiratory variability, suggesting right atrial pressure of 8 mmHg.  9. The patient was in atrial fibrillation. FINDINGS  Left Ventricle: Left ventricular ejection fraction, by estimation, is 40%. The left ventricle has mild to moderately decreased function. The left ventricle demonstrates global hypokinesis. Definity contrast agent was given IV to delineate the left ventricular endocardial borders. The left ventricular internal cavity size was normal in size. There is moderate concentric left ventricular hypertrophy. Left ventricular diastolic parameters are indeterminate. Right Ventricle: The right ventricular size is mildly enlarged. No increase in right ventricular wall thickness. Right ventricular systolic function is mildly reduced. There is moderately elevated pulmonary artery systolic pressure. The tricuspid regurgitant velocity is 3.12 m/s, and with an assumed right atrial pressure of 8 mmHg, the estimated right ventricular systolic pressure is 46.9 mmHg. Left Atrium: Left atrial size was severely dilated. Right Atrium: Right atrial size was moderately dilated. Pericardium: There is no evidence of pericardial effusion. Mitral  Valve: The mitral valve is normal in structure. There is mild calcification of the mitral valve leaflet(s). Mild to moderate mitral valve regurgitation. No  evidence of mitral valve stenosis. Tricuspid Valve: The tricuspid valve is normal in structure. Tricuspid valve regurgitation is mild. Aortic Valve: The aortic valve is tricuspid. There is moderate calcification of the aortic valve. Aortic valve regurgitation is trivial. Aortic regurgitation PHT measures 577 msec. Aortic valve sclerosis/calcification is present, without any evidence of aortic stenosis. Aortic valve mean gradient measures 4.0 mmHg. Aortic valve peak gradient measures 7.2 mmHg. Aortic valve area, by VTI measures 1.34 cm. Pulmonic Valve: The pulmonic valve was normal in structure. Pulmonic valve regurgitation is trivial. Aorta: Aortic dilatation noted. There is borderline dilatation of the aortic root, measuring 37 mm. There is mild dilatation of the ascending aorta, measuring 41 mm. Venous: The inferior vena cava is dilated in size with greater than 50% respiratory variability, suggesting right atrial pressure of 8 mmHg. IAS/Shunts: No atrial level shunt detected by color flow Doppler.  LEFT VENTRICLE PLAX 2D LVIDd:         5.80 cm   Diastology LVIDs:         5.70 cm   LV e' medial:    5.98 cm/s LV PW:         1.10 cm   LV E/e' medial:  11.9 LV IVS:        1.20 cm   LV e' lateral:   11.10 cm/s LVOT diam:     1.90 cm   LV E/e' lateral: 6.4 LV SV:         32 LV SV Index:   15 LVOT Area:     2.84 cm  RIGHT VENTRICLE             IVC RV S prime:     11.80 cm/s  IVC diam: 2.60 cm TAPSE (M-mode): 2.0 cm LEFT ATRIUM              Index        RIGHT ATRIUM           Index LA diam:        5.40 cm  2.60 cm/m   RA Area:     23.40 cm LA Vol (A2C):   110.0 ml 52.93 ml/m  RA Volume:   69.80 ml  33.59 ml/m LA Vol (A4C):   103.0 ml 49.56 ml/m LA Biplane Vol: 110.0 ml 52.93 ml/m  AORTIC VALVE                    PULMONIC VALVE AV Area (Vmax):    1.58 cm     PR End Diast Vel: 2.06 msec AV Area (Vmean):   1.51 cm AV Area (VTI):     1.34 cm AV Vmax:           134.50 cm/s AV Vmean:          91.200 cm/s AV VTI:             0.237 m AV Peak Grad:      7.2 mmHg AV Mean Grad:      4.0 mmHg LVOT Vmax:         74.90 cm/s LVOT Vmean:        48.567 cm/s LVOT VTI:          0.112 m LVOT/AV VTI ratio: 0.47 AI PHT:            577 msec  AORTA  Ao Root diam: 3.70 cm Ao Asc diam:  4.10 cm MITRAL VALVE               TRICUSPID VALVE MV Area (PHT): 6.67 cm    TR Peak grad:   38.9 mmHg MR Peak grad: 111.5 mmHg   TR Vmax:        312.00 cm/s MR Vmax:      528.00 cm/s MV E velocity: 71.10 cm/s  SHUNTS MV A velocity: 52.30 cm/s  Systemic VTI:  0.11 m MV E/A ratio:  1.36        Systemic Diam: 1.90 cm Dalton McleanMD Electronically signed by Wilfred Lacy Signature Date/Time: 10/20/2022/4:19:47 PM    Final    DG Chest 2 View  Result Date: 10/19/2022 CLINICAL DATA:  Chest pain and shortness of breath. EXAM: CHEST - 2 VIEW COMPARISON:  February 15, 2017 FINDINGS: Multiple sternal wires and vascular clips are seen. There is marked severity calcification of the aortic arch and tortuosity of the descending thoracic aorta. The heart size and mediastinal contours are within normal limits. There is stable mild to moderate severity elevation of the right hemidiaphragm. There is no evidence of an acute infiltrate, pleural effusion or pneumothorax. Multilevel degenerative changes are seen throughout the thoracic spine. A vascular stent is noted within the visualized portion of the abdomen on the lateral view. IMPRESSION: 1. Evidence of prior median sternotomy/CABG. 2. No acute cardiopulmonary disease. Electronically Signed   By: Aram Candela M.D.   On: 10/19/2022 19:32     LOS: 1 day   Lanae Boast, MD [facility]  10/21/2022, 10:46 AM

## 2022-10-21 NOTE — TOC Benefit Eligibility Note (Signed)
Patient Advocate Encounter  Insurance verification completed.    The patient is currently admitted and upon discharge could be taking Eliquis 5 mg.  The current 30 day co-pay is $47.00.   The patient is insured through [employer] Medicare Part D   This test claim was processed through [facility]- copay amounts may vary at other pharmacies due to pharmacy/plan contracts, or as the patient moves through the different stages of their insurance plan.  Cleora Karnik, CPHT Pharmacy Patient Advocate Specialist [facility] Patient Advocate Team Direct Number: [phone]  Fax: [phone]       

## 2022-10-21 NOTE — Progress Notes (Signed)
Mobility Specialist Progress Note:   10/21/22 0958  Mobility  Activity Transferred from bed to chair  Level of Assistance Minimal assist, patient does 75% or more  Assistive Device Front wheel walker  Distance Ambulated (ft) 3 ft  Activity Response Tolerated well  Mobility Referral Yes  $Mobility charge 1 Mobility  Mobility Specialist Start Time (ACUTE ONLY) [id number]  Mobility Specialist Stop Time (ACUTE ONLY) 1015  Mobility Specialist Time Calculation (min) (ACUTE ONLY) 17 min   Pt requested to transfer to chair. Required MinA to get EOB, minG to stand and take steps to chair. Left with all needs met, chair alarm on.  Addison Lank Mobility Specialist Please contact via SecureChat or  Rehab office at [phone]

## 2022-10-22 ENCOUNTER — Other Ambulatory Visit (HOSPITAL_COMMUNITY): Payer: Self-pay

## 2022-10-22 ENCOUNTER — Telehealth: Payer: Self-pay | Admitting: Cardiology

## 2022-10-22 DIAGNOSIS — I4819 Other persistent atrial fibrillation: Secondary | ICD-10-CM

## 2022-10-22 DIAGNOSIS — I10 Essential (primary) hypertension: Secondary | ICD-10-CM

## 2022-10-22 DIAGNOSIS — I2510 Atherosclerotic heart disease of native coronary artery without angina pectoris: Secondary | ICD-10-CM | POA: Diagnosis not present

## 2022-10-22 DIAGNOSIS — I4891 Unspecified atrial fibrillation: Secondary | ICD-10-CM | POA: Diagnosis not present

## 2022-10-22 LAB — BASIC METABOLIC PANEL
Anion gap: 11 (ref 5–15)
BUN: 21 mg/dL (ref 8–23)
CO2: 26 mmol/L (ref 22–32)
Calcium: 9.4 mg/dL (ref 8.9–10.3)
Chloride: 102 mmol/L (ref 98–111)
Creatinine, Ser: 1.17 mg/dL (ref 0.61–1.24)
GFR, Estimated: 59 mL/min — ABNORMAL LOW (ref >60)
Glucose, Bld: 89 mg/dL (ref 70–99)
Potassium: 3.6 mmol/L (ref 3.5–5.1)
Sodium: 139 mmol/L (ref 135–145)

## 2022-10-22 LAB — CBC
HCT: 37.5 % — ABNORMAL LOW (ref 39.0–52.0)
Hemoglobin: 11.8 g/dL — ABNORMAL LOW (ref 13.0–17.0)
MCH: 30.7 pg (ref 26.0–34.0)
MCHC: 31.5 g/dL (ref 30.0–36.0)
MCV: 97.7 fL (ref 80.0–100.0)
Platelets: 133 10*3/uL — ABNORMAL LOW (ref 150–400)
RBC: 3.84 MIL/uL — ABNORMAL LOW (ref 4.22–5.81)
RDW: 13.9 % (ref 11.5–15.5)
WBC: 7 10*3/uL (ref 4.0–10.5)
nRBC: 0 % (ref 0.0–0.2)

## 2022-10-22 MED ORDER — METOPROLOL SUCCINATE ER 50 MG PO TB24
150.0000 mg | ORAL_TABLET | Freq: Every day | ORAL | 0 refills | Status: AC
  Filled 2022-10-22: qty 90, 30d supply, fill #0

## 2022-10-22 MED ORDER — APIXABAN 5 MG PO TABS
5.0000 mg | ORAL_TABLET | Freq: Two times a day (BID) | ORAL | 0 refills | Status: AC
  Filled 2022-10-22: qty 60, 30d supply, fill #0

## 2022-10-22 MED ORDER — POTASSIUM CHLORIDE CRYS ER 10 MEQ PO TBCR
10.0000 meq | EXTENDED_RELEASE_TABLET | Freq: Every day | ORAL | 0 refills | Status: AC
  Filled 2022-10-22: qty 30, 30d supply, fill #0

## 2022-10-22 MED ORDER — HYDROCHLOROTHIAZIDE 25 MG PO TABS
25.0000 mg | ORAL_TABLET | Freq: Every day | ORAL | 0 refills | Status: DC
  Filled 2022-10-22: qty 30, 30d supply, fill #0

## 2022-10-22 MED ORDER — POTASSIUM CHLORIDE CRYS ER 10 MEQ PO TBCR
10.0000 meq | EXTENDED_RELEASE_TABLET | Freq: Two times a day (BID) | ORAL | Status: DC
  Administered 2022-10-22: 10 meq via ORAL
  Filled 2022-10-22: qty 1

## 2022-10-22 MED ORDER — HYDROCHLOROTHIAZIDE 25 MG PO TABS
25.0000 mg | ORAL_TABLET | Freq: Every day | ORAL | Status: DC
  Administered 2022-10-22: 25 mg via ORAL
  Filled 2022-10-22: qty 1

## 2022-10-22 MED ORDER — METOPROLOL SUCCINATE ER 50 MG PO TB24
150.0000 mg | ORAL_TABLET | Freq: Every day | ORAL | Status: DC
  Administered 2022-10-22: 150 mg via ORAL
  Filled 2022-10-22: qty 1

## 2022-10-22 NOTE — Progress Notes (Signed)
Rounding Note    Patient Name: Jonathan Arroyo Date of Encounter: 10/22/2022  [facility] Cardiologist: Verne Carrow, MD    Subjective   89 year old gentleman with a history of coronary artery disease, coronary artery bypass grafting, abdominal aortic aneurysm repair with an aortic stent graft, mitral regurgitation.  He was admitted with rapid atrial fibrillation.  He was started on IV Cardizem.  Carvedilol was changed to metoprolol.  He is feeling much better this morning and his heart rate is well-controlled.  He denies any chest pain or shortness of breath.  Troponin levels are 32, 35 and are consistent with demand ischemia.  These are not consistent with an acute coronary syndrome.  Echocardiogram  reveals LVEF of 40%.  This is mildly decreased from his previous echocardiogram in 2018.  I suspect this mild degree of LV dysfunction is due to his 54-month history of rapid atrial fibrillation.  Inpatient Medications    Scheduled Meds:  apixaban  5 mg Oral BID   gabapentin  400 mg Oral BID   isosorbide mononitrate  30 mg Oral Daily   metoprolol tartrate  50 mg Oral BID   mupirocin ointment  1 Application Nasal BID   pantoprazole  40 mg Oral Daily   simvastatin  40 mg Oral QHS   sodium chloride flush  3 mL Intravenous Q12H   tamsulosin  0.4 mg Oral Daily   zolpidem  5 mg Oral QHS   Continuous Infusions:   PRN Meds: acetaminophen **OR** acetaminophen, meclizine, nitroGLYCERIN, polyethylene glycol, traMADol, zolpidem   Vital Signs    Vitals:   10/21/22 2322 10/22/22 0345 10/22/22 0634 10/22/22 0740  BP: 123/80 112/75  (!) 147/104  Pulse: 88     Resp: 16 18  16   Temp: (!) 97.4 F (36.3 C) 97.6 F (36.4 C)  (!) 97.5 F (36.4 C)  TempSrc: Axillary Axillary  Oral  SpO2:  92%    Weight:   74.7 kg   Height:        Intake/Output Summary (Last 24 hours) at 10/22/2022 0825 Last data filed at 10/21/2022 2322 Gross per 24 hour  Intake 550 ml  Output 850  ml  Net -300 ml       10/22/2022    6:34 AM 10/21/2022    5:00 AM 10/20/2022    6:31 PM  Last 3 Weights  Weight (lbs) 164 lb 10.9 oz 163 lb 12.8 oz 163 lb 2.3 oz  Weight (kg) 74.7 kg 74.3 kg 74 kg      Telemetry    Afib with v rate in 80s  - Personally Reviewed  ECG     - Personally Reviewed  Physical Exam   Physical Exam: Blood pressure (!) 147/104, pulse 88, temperature (!) 97.5 F (36.4 C), temperature source Oral, resp. rate 16, height 6\' 2"  (1.88 m), weight 74.7 kg, SpO2 92 %.     GEN:  Well nourished, well developed in no acute distress HEENT: Normal NECK: No JVD; No carotid bruits LYMPHATICS: No lymphadenopathy CARDIAC: irreg. Irreg  RESPIRATORY:  Clear to auscultation without rales, wheezing or rhonchi  ABDOMEN: Soft, non-tender, non-distended MUSCULOSKELETAL:  No edema; No deformity  SKIN: Warm and dry NEUROLOGIC:  Alert and oriented x 3   Labs    High Sensitivity Troponin:   Recent Labs  Lab 10/19/22 1843 10/19/22 2155  TROPONINIHS 32* 35*      Chemistry Recent Labs  Lab 10/20/22 0730 10/21/22 0037 10/22/22 0035  NA 140 140  139  K 3.6 3.6 3.6  CL 102 100 102  CO2 25 29 26   GLUCOSE 85 101* 89  BUN 18 22 21   CREATININE 1.31* 1.41* 1.17  CALCIUM 9.6 9.7 9.4  MG 2.0  --   --   GFRNONAA 52* 47* 59*  ANIONGAP 13 11 11      Lipids  Recent Labs  Lab 10/21/22 0037  CHOL 114  TRIG 94  HDL 33*  LDLCALC 62  CHOLHDL 3.5    Hematology Recent Labs  Lab 10/19/22 1843 10/21/22 0037 10/22/22 0035  WBC 6.2 7.6 7.0  RBC 3.67* 4.11* 3.84*  HGB 11.4* 12.8* 11.8*  HCT 36.5* 40.5 37.5*  MCV 99.5 98.5 97.7  MCH 31.1 31.1 30.7  MCHC 31.2 31.6 31.5  RDW 13.9 13.7 13.9  PLT 125* 143* 133*    Thyroid  Recent Labs  Lab 10/20/22 0730  TSH 2.789     BNP Recent Labs  Lab 10/19/22 1843  BNP 638.4*     DDimer No results for input(s): "DDIMER" in the last 168 hours.   Radiology    ECHOCARDIOGRAM COMPLETE  Result Date: 10/20/2022     ECHOCARDIOGRAM REPORT   Patient Name:   Jonathan Arroyo Date of Exam: 10/20/2022 Medical Rec #:  [id number]        Height:       74.0 in Accession #:    [id number]       Weight:       180.0 lb Date of Birth:  [birth year]         BSA:          2.078 m Patient Age:    89 years         BP:           136/87 mmHg Patient Gender: M                HR:           80 bpm. Exam Location:  Inpatient Procedure: 2D Echo, Cardiac Doppler, Color Doppler and Intracardiac            Opacification Agent Indications:    Atrial Fibrillation I48.91  History:        Patient has prior history of Echocardiogram examinations, most                 recent 03/18/2017. CHF, CAD and Angina, TIA, Arrythmias:Atrial                 Fibrillation; Risk Factors:Hypertension and Dyslipidemia. CKD,                 stage 3.  Sonographer:    Lucendia Herrlich Referring Phys: [id number] TIMOTHY S OPYD IMPRESSIONS  1. Left ventricular ejection fraction, by estimation, is 40%. The left ventricle has mild to moderately decreased function. The left ventricle demonstrates global hypokinesis with akinesis of the basal inferior and basal inferolateral walls. There is moderate concentric left ventricular hypertrophy. Left ventricular diastolic parameters are indeterminate.  2. Right ventricular systolic function is mildly reduced. The right ventricular size is mildly enlarged. There is moderately elevated pulmonary artery systolic pressure. The estimated right ventricular systolic pressure is 46.9 mmHg.  3. Left atrial size was severely dilated.  4. Right atrial size was moderately dilated.  5. The mitral valve is normal in structure. Mild to moderate mitral valve regurgitation. No evidence of mitral stenosis.  6. The aortic valve is tricuspid. There is moderate calcification of the aortic  valve. Aortic valve regurgitation is trivial. Aortic valve sclerosis/calcification is present, without any evidence of aortic stenosis.  7. Aortic dilatation noted. There is borderline  dilatation of the aortic root, measuring 37 mm. There is mild dilatation of the ascending aorta, measuring 41 mm.  8. The inferior vena cava is dilated in size with >50% respiratory variability, suggesting right atrial pressure of 8 mmHg.  9. The patient was in atrial fibrillation. FINDINGS  Left Ventricle: Left ventricular ejection fraction, by estimation, is 40%. The left ventricle has mild to moderately decreased function. The left ventricle demonstrates global hypokinesis. Definity contrast agent was given IV to delineate the left ventricular endocardial borders. The left ventricular internal cavity size was normal in size. There is moderate concentric left ventricular hypertrophy. Left ventricular diastolic parameters are indeterminate. Right Ventricle: The right ventricular size is mildly enlarged. No increase in right ventricular wall thickness. Right ventricular systolic function is mildly reduced. There is moderately elevated pulmonary artery systolic pressure. The tricuspid regurgitant velocity is 3.12 m/s, and with an assumed right atrial pressure of 8 mmHg, the estimated right ventricular systolic pressure is 46.9 mmHg. Left Atrium: Left atrial size was severely dilated. Right Atrium: Right atrial size was moderately dilated. Pericardium: There is no evidence of pericardial effusion. Mitral Valve: The mitral valve is normal in structure. There is mild calcification of the mitral valve leaflet(s). Mild to moderate mitral valve regurgitation. No evidence of mitral valve stenosis. Tricuspid Valve: The tricuspid valve is normal in structure. Tricuspid valve regurgitation is mild. Aortic Valve: The aortic valve is tricuspid. There is moderate calcification of the aortic valve. Aortic valve regurgitation is trivial. Aortic regurgitation PHT measures 577 msec. Aortic valve sclerosis/calcification is present, without any evidence of aortic stenosis. Aortic valve mean gradient measures 4.0 mmHg. Aortic valve peak  gradient measures 7.2 mmHg. Aortic valve area, by VTI measures 1.34 cm. Pulmonic Valve: The pulmonic valve was normal in structure. Pulmonic valve regurgitation is trivial. Aorta: Aortic dilatation noted. There is borderline dilatation of the aortic root, measuring 37 mm. There is mild dilatation of the ascending aorta, measuring 41 mm. Venous: The inferior vena cava is dilated in size with greater than 50% respiratory variability, suggesting right atrial pressure of 8 mmHg. IAS/Shunts: No atrial level shunt detected by color flow Doppler.  LEFT VENTRICLE PLAX 2D LVIDd:         5.80 cm   Diastology LVIDs:         5.70 cm   LV e' medial:    5.98 cm/s LV PW:         1.10 cm   LV E/e' medial:  11.9 LV IVS:        1.20 cm   LV e' lateral:   11.10 cm/s LVOT diam:     1.90 cm   LV E/e' lateral: 6.4 LV SV:         32 LV SV Index:   15 LVOT Area:     2.84 cm  RIGHT VENTRICLE             IVC RV S prime:     11.80 cm/s  IVC diam: 2.60 cm TAPSE (M-mode): 2.0 cm LEFT ATRIUM              Index        RIGHT ATRIUM           Index LA diam:        5.40 cm  2.60 cm/m   RA Area:  23.40 cm LA Vol (A2C):   110.0 ml 52.93 ml/m  RA Volume:   69.80 ml  33.59 ml/m LA Vol (A4C):   103.0 ml 49.56 ml/m LA Biplane Vol: 110.0 ml 52.93 ml/m  AORTIC VALVE                    PULMONIC VALVE AV Area (Vmax):    1.58 cm     PR End Diast Vel: 2.06 msec AV Area (Vmean):   1.51 cm AV Area (VTI):     1.34 cm AV Vmax:           134.50 cm/s AV Vmean:          91.200 cm/s AV VTI:            0.237 m AV Peak Grad:      7.2 mmHg AV Mean Grad:      4.0 mmHg LVOT Vmax:         74.90 cm/s LVOT Vmean:        48.567 cm/s LVOT VTI:          0.112 m LVOT/AV VTI ratio: 0.47 AI PHT:            577 msec  AORTA Ao Root diam: 3.70 cm Ao Asc diam:  4.10 cm MITRAL VALVE               TRICUSPID VALVE MV Area (PHT): 6.67 cm    TR Peak grad:   38.9 mmHg MR Peak grad: 111.5 mmHg   TR Vmax:        312.00 cm/s MR Vmax:      528.00 cm/s MV E velocity: 71.10 cm/s   SHUNTS MV A velocity: 52.30 cm/s  Systemic VTI:  0.11 m MV E/A ratio:  1.36        Systemic Diam: 1.90 cm Dalton McleanMD Electronically signed by Wilfred Lacy Signature Date/Time: 10/20/2022/4:19:47 PM    Final     Cardiac Studies     Patient Profile     89 y.o. male with new onset atrial fibrillation.  I suspect he is actually had this atrial fibrillation for several months which would explain his 60-month history of progressive shortness of breath and generalized fatigue.  Assessment & Plan    1.  Atrial fibrillation: His rate is now well-controlled on Cardizem 7.5 mg/h.  He is also on metoprolol 25 mg twice a day.  He has mild LV dysfunction so Cardizem long-term is not his  best choice.  We will increase his metoporolol to 50 mg twice a day and stop the Cardizem drip and see if he maintains good rate control. TSH is 2.789   At the time of discharge we can change his metoprolol to tartrate to metoprolol succinate.  He is on Eliquis .    2.  Coronary artery disease: no angina    3.  Hyperlipidemia:  LDL is 62,  trigs = 94.  Cont simvastatin   4.  HTN:;   diastolic BP is elevated.  Add HCTZ 25 mg a day, Kdur 10 meq BID .   Will see him in several weeks for office visit and BMP    For questions or updates, please contact [facility] Please consult [email or web address] for contact info under        Signed, Kristeen Miss, MD  10/22/2022, 8:25 AM

## 2022-10-22 NOTE — Plan of Care (Signed)
  Problem: Education: Goal: Knowledge of disease or condition will improve Outcome: Not Progressing Goal: Understanding of medication regimen will improve Outcome: Not Progressing Goal: Individualized Educational Video(s) Outcome: Not Progressing   Problem: Activity: Goal: Ability to tolerate increased activity will improve Outcome: Progressing   Problem: Cardiac: Goal: Ability to achieve and maintain adequate cardiopulmonary perfusion will improve Outcome: Not Progressing   Problem: Health Behavior/Discharge Planning: Goal: Ability to safely manage health-related needs after discharge will improve Outcome: Not Progressing   Problem: Education: Goal: Knowledge of General Education information will improve Description: Including pain rating scale, medication(s)/side effects and non-pharmacologic comfort measures Outcome: Not Progressing   Problem: Health Behavior/Discharge Planning: Goal: Ability to manage health-related needs will improve Outcome: Not Progressing   Problem: Clinical Measurements: Goal: Ability to maintain clinical measurements within normal limits will improve Outcome: Not Progressing Goal: Will remain free from infection Outcome: Progressing Goal: Diagnostic test results will improve Outcome: Progressing Goal: Respiratory complications will improve Outcome: Progressing Goal: Cardiovascular complication will be avoided Outcome: Progressing   Problem: Activity: Goal: Risk for activity intolerance will decrease Outcome: Progressing   Problem: Nutrition: Goal: Adequate nutrition will be maintained Outcome: Progressing   Problem: Coping: Goal: Level of anxiety will decrease Outcome: Not Progressing   Problem: Elimination: Goal: Will not experience complications related to bowel motility Outcome: Progressing Goal: Will not experience complications related to urinary retention Outcome: Progressing   Problem: Pain Managment: Goal: General  experience of comfort will improve Outcome: Progressing   Problem: Safety: Goal: Ability to remain free from injury will improve Outcome: Not Progressing   Problem: Skin Integrity: Goal: Risk for impaired skin integrity will decrease Outcome: Progressing

## 2022-10-22 NOTE — TOC Initial Note (Signed)
Transition of Care (TOC) - Initial/Assessment Note   Spoke to patient and wife and son at bedside.   Patient from home with wife. Has home health PT through [facility]. They would like to increase home health services.   [facility] [employer] [phone] .   Called Morrie Sheldon with [facility]. Morrie Sheldon spoke to patient, wife , son and NCM via speaker phone. Morrie Sheldon will need orders from hospital MD for HHRN,PT/OT and aide. Morrie Sheldon will submit request to [facility] for increased services and also personnel care services. Morrie Sheldon will fax all required documentation to [facility] .   NCM entered orders and secure chatted MD to sign   Patient Details  Name: Jonathan Arroyo MRN: [id number] Date of Birth: Nov 06, 1929  Transition of Care [facility]) CM/SW Contact:    Kingsley Plan, RN Phone Number: 10/22/2022, 11:46 AM  Clinical Narrative:                   Expected Discharge Plan: Home w Home Health Services Barriers to Discharge: Continued Medical Work up   Patient Goals and CMS Choice   CMS Medicare.gov Compare Post Acute Care list provided to:: Patient Choice offered to / list presented to : Patient      Expected Discharge Plan and Services   Discharge Planning Services: CM Consult Post Acute Care Choice: Home Health Living arrangements for the past 2 months: Single Family Home                 DME Arranged: N/A         HH Arranged: RN, PT, OT, Nurse's Aide HH Agency: [facility]) Date HH Agency Contacted: 10/22/22 Time HH Agency Contacted: 1144 Representative spoke with at [facility] Agency: Morrie Sheldon  Prior Living Arrangements/Services Living arrangements for the past 2 months: Single Family Home Lives with:: Spouse Patient language and need for interpreter reviewed:: Yes Do you feel safe going back to the place where you live?: Yes      Need for Family Participation in Patient Care: Yes (Comment) Care giver support system in place?: Yes (comment) Current home  services: DME Criminal Activity/Legal Involvement Pertinent to Current Situation/Hospitalization: No - Comment as needed  Activities of Daily Living Home Assistive Devices/Equipment: Cane (specify quad or straight) (straight) ADL Screening (condition at time of admission) Patient's cognitive ability adequate to safely complete daily activities?: Yes Is the patient deaf or have difficulty hearing?: Yes Does the patient have difficulty seeing, even when wearing glasses/contacts?: No Does the patient have difficulty concentrating, remembering, or making decisions?: No Patient able to express need for assistance with ADLs?: Yes Does the patient have difficulty dressing or bathing?: Yes Independently performs ADLs?: Yes (appropriate for developmental age) Does the patient have difficulty walking or climbing stairs?: Yes Weakness of Legs: Both Weakness of Arms/Hands: None  Permission Sought/Granted   Permission granted to share information with : Yes, Verbal Permission Granted  Share Information with NAME: Wife Gwen and son at bedside  Permission granted to share info w AGENCY: [facility]        Emotional Assessment Appearance:: Appears stated age     Orientation: : Oriented to Self Alcohol / Substance Use: Not Applicable    Admission diagnosis:  Atrial fibrillation with RVR (HCC) [I48.91] Acute on chronic congestive heart failure, unspecified heart failure type [facility]) [I50.9] Patient Active Problem List   Diagnosis Date Noted   Atrial fibrillation with RVR (HCC) 10/20/2022   CKD stage 3a, GFR 45-59  ml/min (HCC) 10/20/2022   Acute on chronic heart failure with preserved ejection fraction (HFpEF) (HCC) 10/20/2022   Coronary artery disease involving native coronary artery of native heart with unstable angina pectoris (HCC)    TIA (transient ischemic attack) 06/15/2017   Chronic rhinitis 04/02/2017   Other dysphagia 04/02/2017   Complicated bronchitis 04/02/2017   Other pulmonary  embolism without acute cor pulmonale (HCC) 04/16/2016   Dyspnea 01/16/2016   Hematuria 10/18/2015   Anxiety disorder due to medical condition 06/24/2015   Aortic aneurysm (HCC) 06/24/2015   Benign prostatic hyperplasia 06/24/2015   Cobalamin deficiency 06/24/2015   Congestive heart failure (CHF) (HCC) 06/24/2015   DDD (degenerative disc disease), lumbosacral 06/24/2015   High risk medication use 06/24/2015   Insomnia 06/24/2015   Lipoma 06/24/2015   Major depressive disorder, recurrent (HCC) 06/24/2015   Malaise and fatigue 06/24/2015   Osteoarthritis 06/24/2015   Stool incontinence 06/24/2015   Coronary arteriosclerosis in native artery 06/24/2015   Diaphragmatic hernia 06/24/2015   Peripheral vascular disease (HCC) 06/24/2015   Hypertension 06/24/2015   Hyperlipemia 06/24/2015   Occlusion and stenosis of carotid artery without mention of cerebral infarction 08/14/2013   Renal artery stenosis (HCC) 08/14/2013   AAA (abdominal aortic aneurysm) without rupture (HCC) 07/27/2013   Chronic systolic heart failure (HCC) 04/25/2013   Unstable angina (HCC) 04/11/2013   Cardiomyopathy, ischemic 04/11/2013   History of DVT (deep vein thrombosis) 04/11/2013   Other specified gastritis without mention of hemorrhage    Hiatal hernia    Anxiety    Dyspnea on exertion 03/29/2013   Palpitations 01/09/2011   CAD, ARTERY BYPASS GRAFT 11/05/2009   DYSLIPIDEMIA 12/01/2008   Essential hypertension 12/01/2008   CORONARY ARTERY DISEASE 12/01/2008   ABDOMINAL AORTIC ANEURYSM 12/01/2008   GASTROESOPHAGEAL REFLUX DISEASE 12/01/2008   HIATAL HERNIA, HX OF 12/01/2008   RENAL CALCULUS, HX OF 12/01/2008   KNEE REPLACEMENT, RIGHT, HX OF 12/01/2008   CORONARY ARTERY BYPASS GRAFT, HX OF 12/01/2008   PERCUTANEOUS TRANSLUMINAL CORONARY ANGIOPLASTY, HX OF 12/01/2008   PCP:  Gordan Payment., MD Pharmacy:   [facility] - [place], [place] - [address] [address] Phone:  [phone] Fax: [phone]  [facility] [address] [place] [address] Phone: [phone] Fax: [phone]     Social Determinants of Health (SDOH) Social History: SDOH Screenings   Food Insecurity: No Food Insecurity (10/20/2022)  Housing: Low Risk  (10/20/2022)  Transportation Needs: No Transportation Needs (10/20/2022)  Utilities: Not At Risk (10/20/2022)  Alcohol Screen: Low Risk  (10/21/2022)  Financial Resource Strain: Low Risk  (10/21/2022)  Tobacco Use: Low Risk  (10/21/2022)   SDOH Interventions: Alcohol Usage Interventions: Intervention Not Indicated (Score <7) Financial Strain Interventions: Intervention Not Indicated   Readmission Risk Interventions     No data to display

## 2022-10-22 NOTE — Telephone Encounter (Signed)
   Transition of Care Follow-up Phone Call Request    Patient Name: Jonathan Arroyo Date of Birth: [birth year] Date of Encounter: 10/22/2022  Primary Care Provider:  Gordan Payment., MD Primary Cardiologist:  Verne Carrow, MD  Jonathan Arroyo has been scheduled for a transition of care follow up appointment with a [facility] provider:  Patient still admitted for atrial fibrillation, but anticipate discharge in the next day or so.  Unable to schedule follow-up appointment due to lack of availability.  Please reach out to patient and help schedule follow-up appointment with Dr. Clifton James or APP.  Please reach out to Jonathan Arroyo within 48 hours to confirm appointment and review transition of care protocol questionnaire.  Abagail Kitchens, PA-C  10/22/2022, 11:29 AM

## 2022-10-22 NOTE — Progress Notes (Addendum)
   Heart Failure Stewardship Pharmacist Progress Note   PCP: Gordan Payment., MD PCP-Cardiologist: Verne Carrow, MD    HPI:  89 yo M with PMH of CHF, HLD, HTN, GERD, CAD s/p CABG, and DVT.   Presented to the ED from his PCP office on 6/3 with shortness of breath and LE edema for 2-4 weeks. PCP completed EKG and found afib RVR and referred to the ED for treatment. CXR with no acute cardiopulmonary disease. ECHO 6/4 showed LVEF 40%, global hypokinesis, moderate concentric LVH, RV mildly reduced, moderately elevated PA pressure, and mild to moderate MR.   Current HF Medications: Beta Blocker: metoprolol tartrate 50 mg BID  Prior to admission HF Medications: Diuretic: furosemide 20 mg daily PRN Beta blocker: carvedilol 3.125 mg BID ACE/ARB/ARNI: lisinopril 20 mg daily  Pertinent Lab Values: Serum creatinine 1.17, BUN 21, Potassium 3.6, Sodium 139, BNP 638.4, Magnesium 2.0  Vital Signs: Weight: 164 lbs (admission weight: 163 lbs) Blood pressure: 110-140/80s  Heart rate: 80s  I/O: -0.5L yesterday; net -3.8L  Medication Assistance / Insurance Benefits Check: Does the patient have prescription insurance?  Yes Type of insurance plan: [facility] Medicare  Outpatient Pharmacy:  Prior to admission outpatient pharmacy: [facility] Is the patient willing to use [facility] pharmacy at discharge? Yes Is the patient willing to transition their outpatient pharmacy to utilize a [facility] outpatient pharmacy?   No    Assessment: 1. Acute on chronic systolic CHF (LVEF 40%), due to ICM. NYHA class II symptoms. - Off IV lasix. Strict I/Os and daily weights. Keep K>4 and Mg>2. - On metoprolol tartrate 50 mg BID - HR 80-110s. May need to increase dose. Will need to consolidate to metoprolol XL prior to discharge - Consider restarting lisinopril rather than adding HCTZ - Tried spironolactone in the past and was stopped due to frequent urination - Consider SGLT2i as outpatient, patient has had  intermittent confusion and urinated on the floor last night per RN   Plan: 1) Medication changes recommended at this time: - Stop HCTZ and start lisinopril 10 mg daily - May need to increase metoprolol and switch to succinate  2) Patient assistance: - None pending  3)  Education  - Patient has been educated on current HF medications and potential additions to HF medication regimen - Patient verbalizes understanding that over the next few months, these medication doses may change and more medications may be added to optimize HF regimen - Patient has been educated on basic disease state pathophysiology and goals of therapy   ADDENDUM: Spoke to cardiology and TRH about HCTZ at discharge. Recommended to stop and resume lisinopril. Recommendation accepted. TOC pharmacist went to the patient's room to retrieve HCTZ but the patient had already been discharged. I contacted the patient's wife and LVM with instructions. Provided call back number for questions.   Sharen Hones, PharmD, BCPS Heart Failure Stewardship Pharmacist Phone [phone]

## 2022-10-22 NOTE — Evaluation (Signed)
Occupational Therapy Evaluation Patient Details Name: Jonathan Arroyo MRN: [id number] DOB: 07/29/1929 Today's Date: 10/22/2022   History of Present Illness Jonathan Arroyo is a 89 y.o. male admitted 10/19/22 with SOB and generalized weakness.  Found to be in a-fib with hypervolemia.  PMH significant for hypertension, hyperlipidemia, CAD status post CABG, AAA status post stent graft, ischemic cardiomyopathy, and PAF no longer anticoagulated.   Clinical Impression   Pt typically is mod I for mobility with DME (typically a hurry cane) and does his own bathing/dressing and assists with dishes and sweeping. Today he is pleasant, cooperative, and motivated. He is min A for bed mobility and transfers with slight balancing lift assist into standing. He is able to don his own socks EOB with extra time and min guard for sink level grooming. Pt enjoyed hallway ambulation with BP and HR staying WFL. OT will continue to follow acutely and at this time recommending HHOT post-acute to maximize safety and independence in ADL And functional transfers as well as energy conservation and higher level cog activities.       Recommendations for follow up therapy are one component of a multi-disciplinary discharge planning process, led by the attending physician.  Recommendations may be updated based on patient status, additional functional criteria and insurance authorization.   Assistance Recommended at Discharge Frequent or constant Supervision/Assistance  Patient can return home with the following A little help with walking and/or transfers;A little help with bathing/dressing/bathroom;Assistance with cooking/housework;Direct supervision/assist for medications management;Assist for transportation;Help with stairs or ramp for entrance    Functional Status Assessment  Patient has had a recent decline in their functional status and demonstrates the ability to make significant improvements in function in a reasonable and  predictable amount of time.  Equipment Recommendations  None recommended by OT (Pt has appropriate DME)    Recommendations for Other Services       Precautions / Restrictions Precautions Precautions: Fall Restrictions Weight Bearing Restrictions: No      Mobility Bed Mobility Overal bed mobility: Needs Assistance Bed Mobility: Supine to Sit     Supine to sit: Min assist     General bed mobility comments: assist for trunk elevation    Transfers Overall transfer level: Needs assistance Equipment used: Straight cane, Rolling walker (2 wheels) Transfers: Sit to/from Stand Sit to Stand: Min assist           General transfer comment: min A for stedying boost      Balance Overall balance assessment: Needs assistance   Sitting balance-Leahy Scale: Good     Standing balance support: Single extremity supported Standing balance-Leahy Scale: Poor Standing balance comment: UE support in standing                           ADL either performed or assessed with clinical judgement   ADL Overall ADL's : Needs assistance/impaired Eating/Feeding: Modified independent   Grooming: Wash/dry hands;Min guard;Standing Grooming Details (indicate cue type and reason): sink level Upper Body Bathing: Set up;Sitting Upper Body Bathing Details (indicate cue type and reason): typically washes up seated in shower Lower Body Bathing: Set up;Sitting/lateral leans   Upper Body Dressing : Sitting;Minimal assistance Upper Body Dressing Details (indicate cue type and reason): min A with RUE Lower Body Dressing: Set up;Sitting/lateral leans Lower Body Dressing Details (indicate cue type and reason): able to don socks EOB Toilet Transfer: Min guard;Ambulation;Rolling walker (2 wheels) (also SPC)   Toileting- Clothing Manipulation and  Hygiene: Min guard;Sit to/from stand   Tub/ Shower Transfer: Walk-in shower;Min guard;Minimal assistance;Shower seat;Grab bars (SPC "hurry cane")    Functional mobility during ADLs: Min guard;Cane;Rolling walker (2 wheels)       Vision Baseline Vision/History: 1 Wears glasses Ability to See in Adequate Light: 0 Adequate Patient Visual Report: No change from baseline Vision Assessment?: No apparent visual deficits     Perception     Praxis      Pertinent Vitals/Pain Pain Assessment Pain Assessment: Faces Faces Pain Scale: Hurts a little bit Pain Location: feet Pain Descriptors / Indicators: Burning Pain Intervention(s): Monitored during session, Repositioned     Hand Dominance Right   Extremity/Trunk Assessment Upper Extremity Assessment Upper Extremity Assessment: RUE deficits/detail RUE Deficits / Details: limited ROM at baseline - shoulder more than elbow RUE Coordination: decreased gross motor (baseline)   Lower Extremity Assessment Lower Extremity Assessment: Defer to PT evaluation   Cervical / Trunk Assessment Cervical / Trunk Assessment: Kyphotic   Communication Communication Communication: HOH   Cognition Arousal/Alertness: Awake/alert Behavior During Therapy: WFL for tasks assessed/performed Overall Cognitive Status: History of cognitive impairments - at baseline                                       General Comments  Pt ambulated in hallway with OT. BP WFL throughout session, wife and son from VB present    Exercises     Shoulder Instructions      Home Living Family/patient expects to be discharged to:: Private residence Living Arrangements: Spouse/significant other Available Help at Discharge: Family Type of Home: House Home Access: Stairs to enter;Ramped entrance     Home Layout: One level;Laundry or work area in basement (getting stair lift to basement)     Bathroom Shower/Tub: Producer, television/film/video: Handicapped height     Home Equipment: Rollator (4 wheels);Cane - single point;Hand held shower head;Grab bars - tub/shower;Shower seat;Electric scooter    Additional Comments: has adjustable bed and lift chair and device to help lift him if he falls      Prior Functioning/Environment Prior Level of Function : Needs assist             Mobility Comments: uses cane or rollator, had about 2-3 falls in 6 months; limited walking due to LE pain (claudication?) ADLs Comments: usually complets unaided, also helps with dishes and sweeping        OT Problem List:        OT Treatment/Interventions:      OT Goals(Current goals can be found in the care plan section) Acute Rehab OT Goals Patient Stated Goal: get home and get stronger OT Goal Formulation: With patient/family Time For Goal Achievement: 11/05/22 Potential to Achieve Goals: Good ADL Goals Pt Will Perform Grooming: with supervision;standing Pt Will Perform Upper Body Dressing: with modified independence;sitting Pt Will Perform Lower Body Dressing: with supervision;sit to/from stand Pt Will Transfer to Toilet: with supervision;ambulating Pt Will Perform Toileting - Clothing Manipulation and hygiene: with supervision;sit to/from stand Additional ADL Goal #1: Pt will verbalize at least 3 strategies for energy conservation during ADL with no cues  OT Frequency:      Co-evaluation              AM-PAC OT "6 Clicks" Daily Activity     Outcome Measure  End of Session Equipment Utilized During Treatment: Gait belt;Rolling walker (2 wheels) Nurse Communication: Mobility status  Activity Tolerance: Patient tolerated treatment well Patient left: in chair;with call bell/phone within reach;with chair alarm set;with family/visitor present                   Time: 0454-0981 OT Time Calculation (min): 30 min Charges:  OT General Charges $OT Visit: 1 Visit OT Evaluation $OT Eval Moderate Complexity: 1 Mod OT Treatments $Self Care/Home Management : 8-22 mins  Nyoka Cowden OTR/L Acute Rehabilitation Services Office: [phone]  Evern Bio [facility] 10/22/2022,  12:33 PM

## 2022-10-22 NOTE — Progress Notes (Signed)
1945 patient alert to self able to make some needs known family at bedside report patient gets out of bed at night and is confused 2200 patient asked for night medication and to be able to brush his teeth patient took medications and brushed teeth and washed face used urinal 2300 patient keep trying to get out of bed after redirection unable to understand time concept, urinated on floor. 10/22/22 0045 patient place in chair for safety and given activity to help keep him busy

## 2022-10-22 NOTE — Discharge Summary (Signed)
Physician Discharge Summary  Jonathan Arroyo:[id number] DOB: [birth year] DOA: 10/19/2022  PCP: Gordan Payment., MD  Admit date: 10/19/2022 Discharge date: 10/22/2022 Recommendations for Outpatient Follow-up:  Follow up with PCP and cardiologu in 1 weeks-call for appointment Please obtain BMP/CBC in one week  Discharge Dispo: Home w/ [facility] Discharge Condition: Stable Code Status:   Code Status: Full Code Diet recommendation:  Diet Order             Diet Heart Room service appropriate? Yes; Fluid consistency: Thin  Diet effective now                    Brief/Interim Summary: 89 y.o.m w/ HTN HLD CAD s/p CABG, AAA S/P stent graft, ischemic cardiomyopathy, and PAF no longer anticoagulated presented to the ED with shortness of breath, generalized weakness x 2 wk. He reports chronic swelling of the lower left leg but has more recently noticed bilateral lower extremity edema. He saw his PCP-found to be in atrial fibrillation, appeared to be hypervolemic, so sent to the ED. WJ:XBJY tachycardia BP stable, not hypoxic. Labs with fairly mild creatinine at 1.3 platelet 125 elevated BNP 638 troponin 32> 35 otherwise unremarkable. Chest x-ray no acute finding. Cardiology was consulted by the ED physician and the patient was given 80 mg IV Lasix and started on IV heparin. TTE done lvef at 40%,The left ventricle demonstrates global hypokinesis with akinesis of the basal inferior and basal inferolateral walls. There is moderate concentric left ventricular hypertrophy. Left ventricular diastolic parameters are indeterminate.  6/6:his medication has been adjusted and on metoprolol 150 mg daily, off Cardizem and on Eliquis.  Overall doing well.hctz was added for BP along with kdur.  He is doing well. Discussed w/ cardio and okay for dc home w/ [facility]  Discharge Diagnoses:  Principal Problem:   Atrial fibrillation with RVR (HCC) Active Problems:   Coronary artery disease involving native coronary artery of  native heart with unstable angina pectoris (HCC)   CKD stage 3a, GFR 45-59 ml/min (HCC)   Acute on chronic heart failure with preserved ejection fraction (HFpEF) (HCC)   A-fib with RVR: rate controlled on current meds cont  eliquis, metoprolol 150 mg.TSH 2.7 and echo shows reduced EF likely from his A-fib.  Ambulated with PT OT and cleared for discharge by cardiology   Mild elevated troponin likely demand ischemia in the setting of A-fib RVR and CHF.  No further recommendation  Acute on chronic HFpEF: Got Lasix in the ED, follow-up echocardiogram w/ EF at 40%-likely from his A-fib.  Currently volume status  stable cont hctz as below.  HTN- not well controlled  added  hctz/kdur, cont toptol  CAD with previous CABG: Continue statin beta-blocker and eliquis.  CKD 3a baseline creatinine 1.1, now holding 1.3-1.4, monitor  Recent Labs    10/19/22 1843 10/20/22 0730 10/21/22 0037 10/22/22 0035  BUN 18 18 22 21   CREATININE 1.32* 1.31* 1.41* 1.17  CO2 24 25 29 26      Consults: cardiology Subjective: Aa, doing well no new complaints  Discharge Exam: Vitals:   10/22/22 0800 10/22/22 1200  BP:    Pulse:    Resp:    Temp:    SpO2: 92% 92%   General: Pt is alert, awake, not in acute distress Cardiovascular: RRR, S1/S2 +, no rubs, no gallops Respiratory: CTA bilaterally, no wheezing, no rhonchi Abdominal: Soft, NT, ND, bowel sounds + Extremities: no edema, no cyanosis  Discharge Instructions  Discharge Instructions  Discharge instructions   Complete by: As directed    Please call call MD or return to ER for similar or worsening recurring problem that brought you to hospital or if any fever,nausea/vomiting,abdominal pain, uncontrolled pain, chest pain,  shortness of breath or any other alarming symptoms.  Please follow-up your doctor as instructed in a week time and call the office for appointment.  Please avoid alcohol, smoking, or any other illicit substance and maintain  healthy habits including taking your regular medications as prescribed.  You were cared for by a hospitalist during your hospital stay. If you have any questions about your discharge medications or the care you received while you were in the hospital after you are discharged, you can call the unit and ask to speak with the hospitalist on call if the hospitalist that took care of you is not available.  Once you are discharged, your primary care physician will handle any further medical issues. Please note that NO REFILLS for any discharge medications will be authorized once you are discharged, as it is imperative that you return to your primary care physician (or establish a relationship with a primary care physician if you do not have one) for your aftercare needs so that they can reassess your need for medications and monitor your lab values   Increase activity slowly   Complete by: As directed       Allergies as of 10/22/2022       Reactions   Alum & Mag Hydroxide-simeth Other (See Comments)   Unknown (Mylanta)   Oxycodone-acetaminophen Other (See Comments)   unknown        Medication List     STOP taking these medications    carvedilol 3.125 MG tablet Commonly known as: COREG   furosemide 20 MG tablet Commonly known as: LASIX   lisinopril 20 MG tablet Commonly known as: ZESTRIL       TAKE these medications    apixaban 5 MG Tabs tablet Commonly known as: ELIQUIS Take 1 tablet (5 mg total) by mouth 2 (two) times daily.   gabapentin 400 MG capsule Commonly known as: NEURONTIN Take 400 mg by mouth 2 (two) times daily.   hydrochlorothiazide 25 MG tablet Commonly known as: HYDRODIURIL Take 1 tablet (25 mg total) by mouth daily. Start taking on: October 23, 2022   isosorbide mononitrate 30 MG 24 hr tablet Commonly known as: IMDUR Take 30 mg by mouth daily.   meclizine 12.5 MG tablet Commonly known as: ANTIVERT Take 12.5 mg by mouth 3 (three) times daily as needed for  dizziness.   metoprolol succinate 50 MG 24 hr tablet Commonly known as: TOPROL-XL Take 3 tablets (150 mg total) by mouth daily.   nitroGLYCERIN 0.4 MG SL tablet Commonly known as: NITROSTAT Place 1 tablet (0.4 mg total) under the tongue every 5 (five) minutes as needed for chest pain (MAX 3 TABLETS).   pantoprazole 40 MG tablet Commonly known as: PROTONIX Take 40 mg by mouth daily.   polyethylene glycol 17 g packet Commonly known as: MIRALAX / GLYCOLAX Take 17 g by mouth daily as needed for mild constipation.   potassium chloride 10 MEQ tablet Commonly known as: KLOR-CON M Take 1 tablet (10 mEq total) by mouth daily.   simvastatin 40 MG tablet Commonly known as: ZOCOR Take 40 mg by mouth at bedtime.   tamsulosin 0.4 MG Caps capsule Commonly known as: FLOMAX Take 0.4 mg by mouth daily.   traMADol 50 MG tablet Commonly known as:  ULTRAM Take 50 mg by mouth 2 (two) times daily as needed for moderate pain.   zolpidem 5 MG tablet Commonly known as: AMBIEN Take 5 mg by mouth at bedtime.        Follow-up Information     [facility] .   Specialty: Cardiology Contact information: [address] [id number] [place] [address] [phone]        [facility]. Go in 17 day(s).   Specialty: Cardiology Why: Hospital follow up 11/09/2022 @ 10 am PLEASE bring  current medication list to appointment FREE valet parking, Entrance C, off [address] [employer] information: [address] [id number] [place]c [place] [address] [phone]        [facility] up.   Contact information: [address] [place] [address] [phone]                Allergies  Allergen Reactions   Alum & Mag Hydroxide-Simeth Other (See Comments)    Unknown (Mylanta)   Oxycodone-Acetaminophen Other (See Comments)     unknown    The results of significant diagnostics from this hospitalization (including imaging, microbiology, ancillary and laboratory) are listed below for reference.    Microbiology: Recent Results (from the past 240 hour(s))  MRSA Next Gen by PCR, Nasal     Status: Abnormal   Collection Time: 10/20/22  6:41 PM   Specimen: Nasal Mucosa; Nasal Swab  Result Value Ref Range Status   MRSA by PCR Next Gen DETECTED (A) NOT DETECTED Final    Comment: RESULT CALLED TO, READ BACK BY AND VERIFIED WITH: RN ANGIE HODGES ON 10/20/22 @ 2014 BY DRT (NOTE) The GeneXpert MRSA Assay (FDA approved for NASAL specimens only), is one component of a comprehensive MRSA colonization surveillance program. It is not intended to diagnose MRSA infection nor to guide or monitor treatment for MRSA infections. Test performance is not FDA approved in patients less than 76 years old. Performed at [facility], [address], [place], [place] [address]     Procedures/Studies: ECHOCARDIOGRAM COMPLETE  Result Date: 10/20/2022    ECHOCARDIOGRAM REPORT   Patient Name:   Jonathan Arroyo DOBBERSTEIN Date of Exam: 10/20/2022 Medical Rec #:  [id number]        Height:       74.0 in Accession #:    [id number]       Weight:       180.0 lb Date of Birth:  02-Sep-1929         BSA:          2.078 m Patient Age:    89 years         BP:           136/87 mmHg Patient Gender: M                HR:           80 bpm. Exam Location:  Inpatient Procedure: 2D Echo, Cardiac Doppler, Color Doppler and Intracardiac            Opacification Agent Indications:    Atrial Fibrillation I48.91  History:        Patient has prior history of Echocardiogram examinations, most                 recent 03/18/2017. CHF, CAD and Angina, TIA, Arrythmias:Atrial  Fibrillation; Risk Factors:Hypertension and Dyslipidemia. CKD,                 stage 3.  Sonographer:    Lucendia Herrlich Referring Phys: [id number] TIMOTHY S OPYD IMPRESSIONS  1. Left ventricular  ejection fraction, by estimation, is 40%. The left ventricle has mild to moderately decreased function. The left ventricle demonstrates global hypokinesis with akinesis of the basal inferior and basal inferolateral walls. There is moderate concentric left ventricular hypertrophy. Left ventricular diastolic parameters are indeterminate.  2. Right ventricular systolic function is mildly reduced. The right ventricular size is mildly enlarged. There is moderately elevated pulmonary artery systolic pressure. The estimated right ventricular systolic pressure is 46.9 mmHg.  3. Left atrial size was severely dilated.  4. Right atrial size was moderately dilated.  5. The mitral valve is normal in structure. Mild to moderate mitral valve regurgitation. No evidence of mitral stenosis.  6. The aortic valve is tricuspid. There is moderate calcification of the aortic valve. Aortic valve regurgitation is trivial. Aortic valve sclerosis/calcification is present, without any evidence of aortic stenosis.  7. Aortic dilatation noted. There is borderline dilatation of the aortic root, measuring 37 mm. There is mild dilatation of the ascending aorta, measuring 41 mm.  8. The inferior vena cava is dilated in size with >50% respiratory variability, suggesting right atrial pressure of 8 mmHg.  9. The patient was in atrial fibrillation. FINDINGS  Left Ventricle: Left ventricular ejection fraction, by estimation, is 40%. The left ventricle has mild to moderately decreased function. The left ventricle demonstrates global hypokinesis. Definity contrast agent was given IV to delineate the left ventricular endocardial borders. The left ventricular internal cavity size was normal in size. There is moderate concentric left ventricular hypertrophy. Left ventricular diastolic parameters are indeterminate. Right Ventricle: The right ventricular size is mildly enlarged. No increase in right ventricular wall thickness. Right ventricular systolic  function is mildly reduced. There is moderately elevated pulmonary artery systolic pressure. The tricuspid regurgitant velocity is 3.12 m/s, and with an assumed right atrial pressure of 8 mmHg, the estimated right ventricular systolic pressure is 46.9 mmHg. Left Atrium: Left atrial size was severely dilated. Right Atrium: Right atrial size was moderately dilated. Pericardium: There is no evidence of pericardial effusion. Mitral Valve: The mitral valve is normal in structure. There is mild calcification of the mitral valve leaflet(s). Mild to moderate mitral valve regurgitation. No evidence of mitral valve stenosis. Tricuspid Valve: The tricuspid valve is normal in structure. Tricuspid valve regurgitation is mild. Aortic Valve: The aortic valve is tricuspid. There is moderate calcification of the aortic valve. Aortic valve regurgitation is trivial. Aortic regurgitation PHT measures 577 msec. Aortic valve sclerosis/calcification is present, without any evidence of aortic stenosis. Aortic valve mean gradient measures 4.0 mmHg. Aortic valve peak gradient measures 7.2 mmHg. Aortic valve area, by VTI measures 1.34 cm. Pulmonic Valve: The pulmonic valve was normal in structure. Pulmonic valve regurgitation is trivial. Aorta: Aortic dilatation noted. There is borderline dilatation of the aortic root, measuring 37 mm. There is mild dilatation of the ascending aorta, measuring 41 mm. Venous: The inferior vena cava is dilated in size with greater than 50% respiratory variability, suggesting right atrial pressure of 8 mmHg. IAS/Shunts: No atrial level shunt detected by color flow Doppler.  LEFT VENTRICLE PLAX 2D LVIDd:         5.80 cm   Diastology LVIDs:         5.70 cm   LV e' medial:  5.98 cm/s LV PW:         1.10 cm   LV E/e' medial:  11.9 LV IVS:        1.20 cm   LV e' lateral:   11.10 cm/s LVOT diam:     1.90 cm   LV E/e' lateral: 6.4 LV SV:         32 LV SV Index:   15 LVOT Area:     2.84 cm  RIGHT VENTRICLE              IVC RV S prime:     11.80 cm/s  IVC diam: 2.60 cm TAPSE (M-mode): 2.0 cm LEFT ATRIUM              Index        RIGHT ATRIUM           Index LA diam:        5.40 cm  2.60 cm/m   RA Area:     23.40 cm LA Vol (A2C):   110.0 ml 52.93 ml/m  RA Volume:   69.80 ml  33.59 ml/m LA Vol (A4C):   103.0 ml 49.56 ml/m LA Biplane Vol: 110.0 ml 52.93 ml/m  AORTIC VALVE                    PULMONIC VALVE AV Area (Vmax):    1.58 cm     PR End Diast Vel: 2.06 msec AV Area (Vmean):   1.51 cm AV Area (VTI):     1.34 cm AV Vmax:           134.50 cm/s AV Vmean:          91.200 cm/s AV VTI:            0.237 m AV Peak Grad:      7.2 mmHg AV Mean Grad:      4.0 mmHg LVOT Vmax:         74.90 cm/s LVOT Vmean:        48.567 cm/s LVOT VTI:          0.112 m LVOT/AV VTI ratio: 0.47 AI PHT:            577 msec  AORTA Ao Root diam: 3.70 cm Ao Asc diam:  4.10 cm MITRAL VALVE               TRICUSPID VALVE MV Area (PHT): 6.67 cm    TR Peak grad:   38.9 mmHg MR Peak grad: 111.5 mmHg   TR Vmax:        312.00 cm/s MR Vmax:      528.00 cm/s MV E velocity: 71.10 cm/s  SHUNTS MV A velocity: 52.30 cm/s  Systemic VTI:  0.11 m MV E/A ratio:  1.36        Systemic Diam: 1.90 cm Dalton McleanMD Electronically signed by Wilfred Lacy Signature Date/Time: 10/20/2022/4:19:47 PM    Final    DG Chest 2 View  Result Date: 10/19/2022 CLINICAL DATA:  Chest pain and shortness of breath. EXAM: CHEST - 2 VIEW COMPARISON:  February 15, 2017 FINDINGS: Multiple sternal wires and vascular clips are seen. There is marked severity calcification of the aortic arch and tortuosity of the descending thoracic aorta. The heart size and mediastinal contours are within normal limits. There is stable mild to moderate severity elevation of the right hemidiaphragm. There is no evidence of an acute infiltrate, pleural effusion or pneumothorax. Multilevel degenerative changes are  seen throughout the thoracic spine. A vascular stent is noted within the visualized portion of the  abdomen on the lateral view. IMPRESSION: 1. Evidence of prior median sternotomy/CABG. 2. No acute cardiopulmonary disease. Electronically Signed   By: Aram Candela M.D.   On: 10/19/2022 19:32    Labs:BNP (last 3 results) Recent Labs    10/19/22 1843  BNP 638.4*   Basic Metabolic Panel: Recent Labs  Lab 10/19/22 1843 10/20/22 0730 10/21/22 0037 10/22/22 0035  NA 138 140 140 139  K 4.9 3.6 3.6 3.6  CL 105 102 100 102  CO2 24 25 29 26   GLUCOSE 88 85 101* 89  BUN 18 18 22 21   CREATININE 1.32* 1.31* 1.41* 1.17  CALCIUM 9.1 9.6 9.7 9.4  MG  --  2.0  --   --    Liver Function Tests: No results for input(s): "AST", "ALT", "ALKPHOS", "BILITOT", "PROT", "ALBUMIN" in the last 168 hours. No results for input(s): "LIPASE", "AMYLASE" in the last 168 hours. No results for input(s): "AMMONIA" in the last 168 hours. CBC: Recent Labs  Lab 10/19/22 1843 10/21/22 0037 10/22/22 0035  WBC 6.2 7.6 7.0  HGB 11.4* 12.8* 11.8*  HCT 36.5* 40.5 37.5*  MCV 99.5 98.5 97.7  PLT 125* 143* 133*  Lipid Profile Recent Labs    10/21/22 0037  CHOL 114  HDL 33*  LDLCALC 62  TRIG 94  CHOLHDL 3.5   Thyroid function studies Recent Labs    10/20/22 0730  TSH 2.789   Anemia work up No results for input(s): "VITAMINB12", "FOLATE", "FERRITIN", "TIBC", "IRON", "RETICCTPCT" in the last 72 hours. Urinalysis    Component Value Date/Time   COLORURINE YELLOW 03/09/2014 1334   APPEARANCEUR CLEAR 03/09/2014 1334   LABSPEC 1.021 03/09/2014 1334   PHURINE 6.0 03/09/2014 1334   GLUCOSEU NEGATIVE 03/09/2014 1334   HGBUR SMALL (A) 03/09/2014 1334   BILIRUBINUR NEGATIVE 03/09/2014 1334   KETONESUR NEGATIVE 03/09/2014 1334   PROTEINUR NEGATIVE 03/09/2014 1334   UROBILINOGEN 0.2 03/09/2014 1334   NITRITE NEGATIVE 03/09/2014 1334   LEUKOCYTESUR NEGATIVE 03/09/2014 1334   Sepsis Labs Recent Labs  Lab 10/19/22 1843 10/21/22 0037 10/22/22 0035  WBC 6.2 7.6 7.0   Microbiology Recent Results  (from the past 240 hour(s))  MRSA Next Gen by PCR, Nasal     Status: Abnormal   Collection Time: 10/20/22  6:41 PM   Specimen: Nasal Mucosa; Nasal Swab  Result Value Ref Range Status   MRSA by PCR Next Gen DETECTED (A) NOT DETECTED Final    Comment: RESULT CALLED TO, READ BACK BY AND VERIFIED WITH: RN ANGIE HODGES ON 10/20/22 @ 2014 BY DRT (NOTE) The GeneXpert MRSA Assay (FDA approved for NASAL specimens only), is one component of a comprehensive MRSA colonization surveillance program. It is not intended to diagnose MRSA infection nor to guide or monitor treatment for MRSA infections. Test performance is not FDA approved in patients less than 5 years old. Performed at [facility], [address] [place], [place] [address]    Time coordinating discharge: 25 minutes  SIGNED: Lanae Boast, MD  [facility] 10/22/2022, 12:27 PM  If 7PM-7AM, please contact night-coverage [email or web address]

## 2022-10-22 NOTE — Telephone Encounter (Signed)
Left voicemail for patient to return call to office. 

## 2022-10-22 NOTE — Telephone Encounter (Signed)
   Transition of Care Follow-up Phone Call Request    Patient Name: Jonathan Arroyo Date of Birth: 01-05-1930 Date of Encounter: 10/22/2022  Primary Care Provider:  Gordan Payment., MD Primary Cardiologist:  Verne Carrow, MD  [name] has been scheduled for a transition of care follow up appointment with a [facility] provider:  Patient still admitted for atrial fibrillation, but anticipate discharge in the next day or so.  Unable to schedule follow-up appointment due to lack of availability.  Please reach out to patient and help schedule follow-up appointment with Dr. Clifton James or APP.  Please reach out to [name] within 48 hours to confirm appointment and review transition of care protocol questionnaire.  Abagail Kitchens, PA-C  10/22/2022, 11:35 AM

## 2022-11-09 ENCOUNTER — Encounter (HOSPITAL_COMMUNITY): Payer: Self-pay

## 2022-11-09 ENCOUNTER — Ambulatory Visit (HOSPITAL_COMMUNITY)
Admission: RE | Admit: 2022-11-09 | Discharge: 2022-11-09 | Disposition: A | Discharge status: Home / Self Care | Payer: Medicare Other | Source: Ambulatory Visit | Attending: Cardiology | Admitting: Cardiology

## 2022-11-09 VITALS — BP 110/70 | HR 82 | Wt 170.4 lb

## 2022-11-09 DIAGNOSIS — I5022 Chronic systolic (congestive) heart failure: Secondary | ICD-10-CM | POA: Diagnosis present

## 2022-11-09 DIAGNOSIS — Z8249 Family history of ischemic heart disease and other diseases of the circulatory system: Secondary | ICD-10-CM | POA: Insufficient documentation

## 2022-11-09 DIAGNOSIS — I4819 Other persistent atrial fibrillation: Secondary | ICD-10-CM | POA: Diagnosis not present

## 2022-11-09 DIAGNOSIS — Z79899 Other long term (current) drug therapy: Secondary | ICD-10-CM | POA: Diagnosis not present

## 2022-11-09 DIAGNOSIS — I110 Hypertensive heart disease with heart failure: Secondary | ICD-10-CM | POA: Insufficient documentation

## 2022-11-09 DIAGNOSIS — Z951 Presence of aortocoronary bypass graft: Secondary | ICD-10-CM | POA: Diagnosis not present

## 2022-11-09 DIAGNOSIS — Z7901 Long term (current) use of anticoagulants: Secondary | ICD-10-CM | POA: Insufficient documentation

## 2022-11-09 DIAGNOSIS — I2510 Atherosclerotic heart disease of native coronary artery without angina pectoris: Secondary | ICD-10-CM | POA: Insufficient documentation

## 2022-11-09 DIAGNOSIS — Z86718 Personal history of other venous thrombosis and embolism: Secondary | ICD-10-CM | POA: Diagnosis not present

## 2022-11-09 DIAGNOSIS — I340 Nonrheumatic mitral (valve) insufficiency: Secondary | ICD-10-CM | POA: Diagnosis not present

## 2022-11-09 LAB — CBC
HCT: 38 % — ABNORMAL LOW (ref 39.0–52.0)
Hemoglobin: 12.1 g/dL — ABNORMAL LOW (ref 13.0–17.0)
MCH: 31.9 pg (ref 26.0–34.0)
MCHC: 31.8 g/dL (ref 30.0–36.0)
MCV: 100.3 fL — ABNORMAL HIGH (ref 80.0–100.0)
Platelets: 165 10*3/uL (ref 150–400)
RBC: 3.79 MIL/uL — ABNORMAL LOW (ref 4.22–5.81)
RDW: 14.2 % (ref 11.5–15.5)
WBC: 7.3 10*3/uL (ref 4.0–10.5)
nRBC: 0 % (ref 0.0–0.2)

## 2022-11-09 LAB — BASIC METABOLIC PANEL
Anion gap: 7 (ref 5–15)
BUN: 24 mg/dL — ABNORMAL HIGH (ref 8–23)
CO2: 28 mmol/L (ref 22–32)
Calcium: 9.7 mg/dL (ref 8.9–10.3)
Chloride: 103 mmol/L (ref 98–111)
Creatinine, Ser: 1.27 mg/dL — ABNORMAL HIGH (ref 0.61–1.24)
GFR, Estimated: 54 mL/min — ABNORMAL LOW (ref >60)
Glucose, Bld: 81 mg/dL (ref 70–99)
Potassium: 3.8 mmol/L (ref 3.5–5.1)
Sodium: 138 mmol/L (ref 135–145)

## 2022-11-09 LAB — BRAIN NATRIURETIC PEPTIDE: B Natriuretic Peptide: 548 pg/mL — ABNORMAL HIGH (ref 0.0–100.0)

## 2022-11-09 MED ORDER — FUROSEMIDE 20 MG PO TABS: 20.0000 mg | ORAL_TABLET | Freq: Every day | ORAL | 2 refills | Status: DC | PRN

## 2022-11-09 NOTE — Patient Instructions (Addendum)
RedsClip done today.   EKG done today.  Labs done today. We will contact you only if your labs are abnormal.  START Lasix 20mg  (1 tablet) by mouth daily as needed for a weight gain of 3 pounds or more in 24 hours or 5 pounds in 1 week.   No other medication changes were made. Please continue all current medications as prescribed.  Thank you for allowing [place] to provider your heart failure care after your recent hospitalization. Please follow-up with General Cardiology.

## 2022-11-09 NOTE — Progress Notes (Signed)
HEART & VASCULAR TRANSITION OF CARE CONSULT NOTE     Referring Physician: Dr. Elease Hashimoto  Primary Care: Gordan Payment., MD Primary Cardiologist: Verne Carrow, MD   HPI: Referred to clinic by Dr. Elease Hashimoto for heart failure consultation.   89 y/o male w/ h/o HLD, HTN, GERD, CAD s/p CABG (1994 redo in 2005), systolic heart failure, MR, h/o DVT and AAA, s/p aortic graft stent in 2015.   Underwent redo CABG 07/04/03 with right radial graft to PDA, SVG to Circumflex. His LIMA to LAD was patent by cath. He was admitted November 2014 with chest pain c/w angina. Cardiac cath 04/10/13 and found to have patent LIMA to LAD, patent free radial graft to PDA but occluded SVG to Circumflex system with 99% distal left main stenosis supplying the Circumflex as well as severe distal Circumflex stenosis. Underwent stenting of distal Circumflex extending into the second OM branch. + stenting of the distal left main extending into the proximal Circumflex.  Echo in 2017 showed LVEF=45-50%, moderate MR. He was diagnosed with a PE in December 2017 and was on Xarelto which has been stopped. Last cardiac cath February 2019 showed severe native vessel CAD with occlusion of the ostial LAD and ostial RCA. The LIMA to the LAD is patent. The free radial graft to the PDA is patent. The Left main/Circumflex stent was patent. The OM2 stent was patent. Echo November 2018 with LVEF=50-55%.   Recent admission to [facility] for subacute dyspnea, endorsed 34-month history of progressive shortness of breath and generalized fatigue. Denied CP. Found to be in atrial fibrillation and acute CHF. Hs trops were low level and flat, not c/w ACS ( 32>>35). He was initially placed on Cardizem gtt w/ improvement in rate control. Echo showed mildly reduced LVEF 40%, global HK of the LV w/ AK of the basal inferior and basal inferolateral walls, moderate LVH, RV mildly reduced, severe LAE, moderate RAE, mild-mod MR and moderate calcifications of the AoV  w/o stenosis. Given reduced EF, Cardizem was discontinued and his home ? blocker was titrated up. He was started on Eliquis for a/c and diuresed w/ IV Lasix. Had improvement in rate control, volume status and symptoms. Discharged home on 6/6 and referred to [facility] clinic.   He presents today for f/u. Here w/ his wife. In WC. Remains in Afib on EKG, rate controlled HR 80s. Denies palpitations. Denies resting dyspnea but c/w exertional dyspnea and bendopnea. Denies orthopnea. No LEE. Has rare chest pain, no frequent. Took SL NTG 4 wks ago w/ resolution of pain. No further recurrence. Reports full med compliance. Checking wt daily. Daily wts have fluctuated, only takes lasix PNR. BP well controlled 110/70. Denies orthostatic symptoms. No falls or abnormal bleeding w/ Eliquis. ReDs 23%.    2D Echo 6/24 1. Left ventricular ejection fraction, by estimation, is 40%. The left  ventricle has mild to moderately decreased function. The left ventricle  demonstrates global hypokinesis with akinesis of the basal inferior and  basal inferolateral walls. There is  moderate concentric left ventricular hypertrophy. Left ventricular  diastolic parameters are indeterminate.   2. Right ventricular systolic function is mildly reduced. The right  ventricular size is mildly enlarged. There is moderately elevated  pulmonary artery systolic pressure. The estimated right ventricular  systolic pressure is 46.9 mmHg.   3. Left atrial size was severely dilated.   4. Right atrial size was moderately dilated.   5. The mitral valve is normal in structure. Mild to moderate mitral valve  regurgitation. No evidence of mitral stenosis.   6. The aortic valve is tricuspid. There is moderate calcification of the  aortic valve. Aortic valve regurgitation is trivial. Aortic valve  sclerosis/calcification is present, without any evidence of aortic  stenosis.   7. Aortic dilatation noted. There is borderline dilatation of the aortic   root, measuring 37 mm. There is mild dilatation of the ascending aorta,  measuring 41 mm.   8. The inferior vena cava is dilated in size with >50% respiratory  variability, suggesting right atrial pressure of 8 mmHg.   9. The patient was in atrial fibrillation.    Review of Systems: [y] = yes, [ ]  = no   General: Weight gain [ ] ; Weight loss [ ] ; Anorexia [ ] ; Fatigue [ ] ; Fever [ ] ; Chills [ ] ; Weakness [ ]   Cardiac: Chest pain/pressure [ ] ; Resting SOB [ ] ; Exertional SOB [ Y]; Orthopnea [ ] ; Pedal Edema [ ] ; Palpitations [ ] ; Syncope [ ] ; Presyncope [ ] ; Paroxysmal nocturnal dyspnea[ ]   Pulmonary: Cough [ ] ; Wheezing[ ] ; Hemoptysis[ ] ; Sputum [ ] ; Snoring [ ]   GI: Vomiting[ ] ; Dysphagia[ ] ; Melena[ ] ; Hematochezia [ ] ; Heartburn[ ] ; Abdominal pain [ ] ; Constipation [ ] ; Diarrhea [ ] ; BRBPR [ ]   GU: Hematuria[ ] ; Dysuria [ ] ; Nocturia[ ]   Vascular: Pain in legs with walking [ ] ; Pain in feet with lying flat [ ] ; Non-healing sores [ ] ; Stroke [ ] ; TIA [ ] ; Slurred speech [ ] ;  Neuro: Headaches[ ] ; Vertigo[ ] ; Seizures[ ] ; Paresthesias[ ] ;Blurred vision [ ] ; Diplopia [ ] ; Vision changes [ ]   Ortho/Skin: Arthritis [ ] ; Joint pain [ ] ; Muscle pain [ ] ; Joint swelling [ ] ; Back Pain [ ] ; Rash [ ]   Psych: Depression[ ] ; Anxiety[ ]   Heme: Bleeding problems [ ] ; Clotting disorders [ ] ; Anemia [ ]   Endocrine: Diabetes [ ] ; Thyroid dysfunction[ ]    Past Medical History:  Diagnosis Date   Abdominal aneurysm without mention of rupture    a. s/p stent graft 2015 by [facility].   Acute venous embolism and thrombosis of unspecified deep vessels of lower extremity    Anxiety    CAD (coronary artery disease)    a. s/p CABG 1994, redo 2005. b. [place] 03/2013: 2/3 patent bpg. s/p PTCA/DES to distal LM and PTCA/DES to distal LCx.   Chronic combined systolic and diastolic CHF (congestive heart failure) (HCC)    Esophageal reflux    Hiatal hernia    HISTORY   HTN (hypertension)    Hyperlipidemia     Ischemic cardiomyopathy    a. EF history: 2011: 35-40%, 11/2012: 45-50%. b. 03/2013: 35% by cath.   Mild mitral regurgitation    Myocardial infarction [facility])    Other specified gastritis without mention of hemorrhage    Premature atrial contractions    Pulmonary embolism (HCC) 04/2016   PVC's (premature ventricular contractions)    Renal calculus    history   Spinal stenosis, unspecified region other than cervical    Stomach pain June 2013   Hospital stay at [facility]   Stroke [facility])     Current Outpatient Medications  Medication Sig Dispense Refill   apixaban (ELIQUIS) 5 MG TABS tablet Take 1 tablet (5 mg total) by mouth 2 (two) times daily. 60 tablet 0   gabapentin (NEURONTIN) 400 MG capsule Take 400 mg by mouth 2 (two) times daily.     isosorbide mononitrate (IMDUR) 30 MG 24 hr tablet Take  30 mg by mouth daily.     lisinopril (ZESTRIL) 20 MG tablet Take 20 mg by mouth daily.     meclizine (ANTIVERT) 12.5 MG tablet Take 12.5 mg by mouth 3 (three) times daily as needed for dizziness.     metoprolol succinate (TOPROL-XL) 50 MG 24 hr tablet Take 3 tablets (150 mg total) by mouth daily. 90 tablet 0   nitroGLYCERIN (NITROSTAT) 0.4 MG SL tablet Place 1 tablet (0.4 mg total) under the tongue every 5 (five) minutes as needed for chest pain (MAX 3 TABLETS). 25 tablet 3   pantoprazole (PROTONIX) 40 MG tablet Take 40 mg by mouth daily.     polyethylene glycol (MIRALAX / GLYCOLAX) packet Take 17 g by mouth daily as needed for mild constipation.     potassium chloride (KLOR-CON M) 10 MEQ tablet Take 1 tablet (10 mEq total) by mouth daily. 30 tablet 0   simvastatin (ZOCOR) 40 MG tablet Take 40 mg by mouth at bedtime.      tamsulosin (FLOMAX) 0.4 MG CAPS capsule Take 0.4 mg by mouth daily.     traMADol (ULTRAM) 50 MG tablet Take 50 mg by mouth 2 (two) times daily as needed for moderate pain.      zolpidem (AMBIEN) 5 MG tablet Take 5 mg by mouth at bedtime.     No current facility-administered  medications for this encounter.    Allergies  Allergen Reactions   Alum & Mag Hydroxide-Simeth Other (See Comments)    Unknown (Mylanta)   Oxycodone-Acetaminophen Other (See Comments)    unknown      Social History   Socioeconomic History   Marital status: Married    Spouse name: Gweldyn   Number of children: 6   Years of education: Not on file   Highest education level: Not on file  Occupational History   Occupation: Retired  Tobacco Use   Smoking status: Never   Smokeless tobacco: Never  Vaping Use   Vaping Use: Never used  Substance and Sexual Activity   Alcohol use: No    Alcohol/week: 0.0 standard drinks of alcohol   Drug use: No   Sexual activity: Not on file  Other Topics Concern   Not on file  Social History Narrative   Not on file   Social Determinants of Health   Financial Resource Strain: Low Risk  (10/21/2022)   Overall Financial Resource Strain (CARDIA)    Difficulty of Paying Living Expenses: Not hard at all  Food Insecurity: No Food Insecurity (10/20/2022)   Hunger Vital Sign    Worried About Running Out of Food in the Last Year: Never true    Ran Out of Food in the Last Year: Never true  Transportation Needs: No Transportation Needs (10/20/2022)   PRAPARE - Administrator, Civil Service (Medical): No    Lack of Transportation (Non-Medical): No  Physical Activity: Not on file  Stress: Not on file  Social Connections: Not on file  Intimate Partner Violence: Not At Risk (10/20/2022)   Humiliation, Afraid, Rape, and Kick questionnaire    Fear of Current or Ex-Partner: No    Emotionally Abused: No    Physically Abused: No    Sexually Abused: No      Family History  Problem Relation Age of Onset   Coronary artery disease Mother    Diabetes Mother    Heart disease Mother    Hyperlipidemia Mother    Hypertension Mother    Heart attack Mother  Heart disease Father    Hyperlipidemia Father    Hypertension Father    Heart attack  Father    Stroke Neg Hx     Vitals:   11/09/22 1009  BP: 110/70  Pulse: 82  SpO2: 97%  Weight: 77.3 kg (170 lb 6.4 oz)    PHYSICAL EXAM: ReDS 23%  General:  Well appearing WM, in WC. No respiratory difficulty HEENT: normal Neck: supple. no JVD. Carotids 2+ bilat; no bruits. No lymphadenopathy or thryomegaly appreciated. Cor: PMI nondisplaced. Irregularly irregular rhythm. 2/6 MR murmur  Lungs: clear Abdomen: soft, nontender, nondistended. No hepatosplenomegaly. No bruits or masses. Good bowel sounds. Extremities: no cyanosis, clubbing, rash, edema Neuro: alert & oriented x 3, cranial nerves grossly intact. moves all 4 extremities w/o difficulty. Affect pleasant.  ECG: Afib 80 bpm    ASSESSMENT & PLAN:  1. Chronic Systolic Heart Failure - Echo November 2018 with LVEF=50-55%.  - Echo 6/24 EF 40%. RV mildly reduced - has known CAD w/ h/o CABG, however very infrequent ischemic like CP. Drop in EF suspected to be tachy mediated from recent afib w/ RVR, which is now well rate controlled - Euvolemic on exam and by ReDs, 23%  - continue Lasix PRN, 20 mg  - Check BMP and BNP today  - Continue Lisinopril 20 mg daily. Will avoid Entresto given age, soft BP and risk for orthostatics/falls  - Continue Imdur 30 mg daily  - Continue Toprol XL 150 mg daily daily   2. Persistent Atrial Fibrillation - Rate controlled, 80s. Asymptomatic - Continue Toprol XL 150 mg daily  - Continue Eliquis 5 mg bid. Denies abnormal bleeding. Check CBC and BMP today   - Defer DCCV to Cardiology   3. CAD - s/p CABG, followed by Redo in 2015 - has rare CP episodes. Currently CP free  - on Imdur, ? blocker + statin. No ASA given need for Eliquis   4. Mitral Regurgitation  - mild-mod on recent echo  - HF + Afib optimization per above     Referred to HFSW (PCP, Medications, Transportation, ETOH Abuse, Drug Abuse, Insurance, Financial ): No Refer to Pharmacy: No Refer to Home Health: (already  followed)  Refer to Advanced Heart Failure Clinic: No  Refer to General Cardiology: Yes (followed by Dr. Clifton James)   Follow up  w/ cardiology in 4 wks

## 2022-11-09 NOTE — Progress Notes (Signed)
ReDS Vest / Clip - 11/09/22 1000       ReDS Vest / Clip   Station Marker C    Ruler Value 31    ReDS Value Range Low volume    ReDS Actual Value 23

## 2022-11-25 ENCOUNTER — Ambulatory Visit: Payer: Medicare Other | Admitting: Cardiovascular Disease

## 2022-11-30 ENCOUNTER — Ambulatory Visit: Payer: Medicare Other | Admitting: Physician Assistant

## 2023-06-19 DEATH — deceased
